# Patient Record
Sex: Male | Born: 1950
Health system: Southern US, Community
[De-identification: ages and names within clinical notes are randomized; demographics above are authoritative.]

## PROBLEM LIST (undated history)

## (undated) DIAGNOSIS — R42 Dizziness and giddiness: Secondary | ICD-10-CM

## (undated) DIAGNOSIS — I10 Essential (primary) hypertension: Secondary | ICD-10-CM

## (undated) DIAGNOSIS — K759 Inflammatory liver disease, unspecified: Secondary | ICD-10-CM

## (undated) DIAGNOSIS — F1911 Other psychoactive substance abuse, in remission: Secondary | ICD-10-CM

## (undated) DIAGNOSIS — I639 Cerebral infarction, unspecified: Secondary | ICD-10-CM

## (undated) DIAGNOSIS — M1990 Unspecified osteoarthritis, unspecified site: Secondary | ICD-10-CM

## (undated) DIAGNOSIS — E785 Hyperlipidemia, unspecified: Secondary | ICD-10-CM

## (undated) DIAGNOSIS — J45909 Unspecified asthma, uncomplicated: Secondary | ICD-10-CM

## (undated) DIAGNOSIS — N400 Enlarged prostate without lower urinary tract symptoms: Secondary | ICD-10-CM

## (undated) HISTORY — DX: Essential (primary) hypertension: I10

## (undated) HISTORY — DX: Cerebral infarction, unspecified: I63.9

## (undated) HISTORY — DX: Hyperlipidemia, unspecified: E78.5

## (undated) HISTORY — PX: NO PAST SURGERIES: SHX2092

## (undated) HISTORY — DX: Benign prostatic hyperplasia without lower urinary tract symptoms: N40.0

## (undated) HISTORY — PX: COLONOSCOPY: SHX174

## (undated) HISTORY — PX: MULTIPLE TOOTH EXTRACTIONS: SHX2053

---

## 2001-11-03 ENCOUNTER — Encounter: Payer: Self-pay | Admitting: Emergency Medicine

## 2001-11-03 ENCOUNTER — Emergency Department (HOSPITAL_COMMUNITY): Admission: EM | Admit: 2001-11-03 | Discharge: 2001-11-03 | Payer: Self-pay | Admitting: Emergency Medicine

## 2005-07-07 ENCOUNTER — Emergency Department (HOSPITAL_COMMUNITY): Admission: EM | Admit: 2005-07-07 | Discharge: 2005-07-07 | Payer: Self-pay | Admitting: Family Medicine

## 2008-04-01 ENCOUNTER — Emergency Department (HOSPITAL_COMMUNITY): Admission: EM | Admit: 2008-04-01 | Discharge: 2008-04-01 | Payer: Self-pay | Admitting: Emergency Medicine

## 2010-02-04 ENCOUNTER — Emergency Department (HOSPITAL_COMMUNITY): Admission: EM | Admit: 2010-02-04 | Discharge: 2010-02-04 | Payer: Self-pay | Admitting: Emergency Medicine

## 2011-06-22 LAB — DIFFERENTIAL
Basophils Absolute: 0
Eosinophils Absolute: 0.1
Lymphocytes Relative: 43
Lymphs Abs: 2.4
Neutrophils Relative %: 45

## 2011-06-22 LAB — URINALYSIS, ROUTINE W REFLEX MICROSCOPIC
Ketones, ur: 15 — AB
Nitrite: NEGATIVE
Urobilinogen, UA: 1
pH: 6

## 2011-06-22 LAB — COMPREHENSIVE METABOLIC PANEL
ALT: 30
CO2: 29
Calcium: 9.5
Creatinine, Ser: 1.2
GFR calc non Af Amer: >60
Glucose, Bld: 100 — ABNORMAL HIGH
Total Bilirubin: 1.3 — ABNORMAL HIGH

## 2011-06-22 LAB — LIPASE, BLOOD: Lipase: 41

## 2011-06-22 LAB — CBC
HCT: 39.6
Hemoglobin: 13.1
MCHC: 33.1
MCV: 88.9
Platelets: 284
RBC: 4.46
RDW: 12.8
WBC: 5.6

## 2012-12-16 ENCOUNTER — Encounter (HOSPITAL_COMMUNITY): Payer: Self-pay | Admitting: Emergency Medicine

## 2012-12-16 ENCOUNTER — Emergency Department (HOSPITAL_COMMUNITY): Payer: Self-pay

## 2012-12-16 ENCOUNTER — Emergency Department (HOSPITAL_COMMUNITY)
Admission: EM | Admit: 2012-12-16 | Discharge: 2012-12-16 | Disposition: A | Discharge status: Home / Self Care | Payer: Self-pay | Attending: Emergency Medicine | Admitting: Emergency Medicine

## 2012-12-16 DIAGNOSIS — J019 Acute sinusitis, unspecified: Secondary | ICD-10-CM

## 2012-12-16 DIAGNOSIS — Z8673 Personal history of transient ischemic attack (TIA), and cerebral infarction without residual deficits: Secondary | ICD-10-CM | POA: Insufficient documentation

## 2012-12-16 DIAGNOSIS — R030 Elevated blood-pressure reading, without diagnosis of hypertension: Secondary | ICD-10-CM

## 2012-12-16 DIAGNOSIS — I10 Essential (primary) hypertension: Secondary | ICD-10-CM | POA: Insufficient documentation

## 2012-12-16 DIAGNOSIS — J329 Chronic sinusitis, unspecified: Secondary | ICD-10-CM | POA: Insufficient documentation

## 2012-12-16 DIAGNOSIS — R51 Headache: Secondary | ICD-10-CM | POA: Insufficient documentation

## 2012-12-16 DIAGNOSIS — R209 Unspecified disturbances of skin sensation: Secondary | ICD-10-CM | POA: Insufficient documentation

## 2012-12-16 DIAGNOSIS — Q674 Other congenital deformities of skull, face and jaw: Secondary | ICD-10-CM | POA: Insufficient documentation

## 2012-12-16 DIAGNOSIS — R5381 Other malaise: Secondary | ICD-10-CM | POA: Insufficient documentation

## 2012-12-16 DIAGNOSIS — R4701 Aphasia: Secondary | ICD-10-CM | POA: Insufficient documentation

## 2012-12-16 DIAGNOSIS — H538 Other visual disturbances: Secondary | ICD-10-CM | POA: Insufficient documentation

## 2012-12-16 DIAGNOSIS — F17200 Nicotine dependence, unspecified, uncomplicated: Secondary | ICD-10-CM | POA: Insufficient documentation

## 2012-12-16 DIAGNOSIS — R42 Dizziness and giddiness: Secondary | ICD-10-CM | POA: Insufficient documentation

## 2012-12-16 HISTORY — DX: Dizziness and giddiness: R42

## 2012-12-16 LAB — CBC WITH DIFFERENTIAL/PLATELET
Basophils Absolute: 0 10*3/uL (ref 0.0–0.1)
Eosinophils Absolute: 0.2 10*3/uL (ref 0.0–0.7)
Hemoglobin: 12.3 g/dL — ABNORMAL LOW (ref 13.0–17.0)
Lymphocytes Relative: 46 % (ref 12–46)
MCH: 28.4 pg (ref 26.0–34.0)
MCHC: 33 g/dL (ref 30.0–36.0)
Monocytes Absolute: 0.4 10*3/uL (ref 0.1–1.0)
Neutrophils Relative %: 43 % (ref 43–77)
Platelets: 208 10*3/uL (ref 150–400)
RDW: 13 % (ref 11.5–15.5)

## 2012-12-16 LAB — COMPREHENSIVE METABOLIC PANEL
AST: 23 U/L (ref 0–37)
Albumin: 3.2 g/dL — ABNORMAL LOW (ref 3.5–5.2)
Alkaline Phosphatase: 62 U/L (ref 39–117)
Chloride: 107 mEq/L (ref 96–112)
Potassium: 4.2 mEq/L (ref 3.5–5.1)
Sodium: 141 mEq/L (ref 135–145)
Total Bilirubin: 0.2 mg/dL — ABNORMAL LOW (ref 0.3–1.2)
Total Protein: 7.3 g/dL (ref 6.0–8.3)

## 2012-12-16 MED ORDER — OXYMETAZOLINE HCL 0.05 % NA SOLN: 2.0000 | Freq: Two times a day (BID) | NASAL | Status: DC

## 2012-12-16 MED ORDER — AMOXICILLIN 500 MG PO CAPS
500.0000 mg | ORAL_CAPSULE | Freq: Once | ORAL | Status: AC
  Administered 2012-12-16: 500 mg via ORAL
  Filled 2012-12-16: qty 1

## 2012-12-16 MED ORDER — ASPIRIN EC 81 MG PO TBEC
81.0000 mg | DELAYED_RELEASE_TABLET | Freq: Every day | ORAL | Status: DC
  Administered 2012-12-16: 81 mg via ORAL
  Filled 2012-12-16 (×2): qty 1

## 2012-12-16 MED ORDER — PREDNISONE 20 MG PO TABS: ORAL_TABLET | ORAL | Status: DC

## 2012-12-16 MED ORDER — AMOXICILLIN 500 MG PO CAPS: 500.0000 mg | ORAL_CAPSULE | Freq: Two times a day (BID) | ORAL | Status: DC

## 2012-12-16 MED ORDER — PREDNISONE 20 MG PO TABS
40.0000 mg | ORAL_TABLET | Freq: Once | ORAL | Status: AC
  Administered 2012-12-16: 40 mg via ORAL
  Filled 2012-12-16: qty 2

## 2012-12-16 MED ORDER — HYDROCHLOROTHIAZIDE 25 MG PO TABS
25.0000 mg | ORAL_TABLET | Freq: Once | ORAL | Status: AC
  Administered 2012-12-16: 25 mg via ORAL
  Filled 2012-12-16: qty 1

## 2012-12-16 MED ORDER — SALINE SPRAY 0.65 % NA SOLN: 1.0000 | NASAL | Status: DC | PRN

## 2012-12-16 MED ORDER — SALINE NASAL SPRAY 0.65 % NA SOLN: 1.0000 | NASAL | Status: DC | PRN

## 2012-12-16 MED ORDER — ATORVASTATIN CALCIUM 40 MG PO TABS: 40.0000 mg | ORAL_TABLET | Freq: Every day | ORAL | Status: DC

## 2012-12-16 MED ORDER — ASPIRIN EC 81 MG PO TBEC: 81.0000 mg | DELAYED_RELEASE_TABLET | Freq: Every day | ORAL | Status: AC

## 2012-12-16 MED ORDER — SODIUM CHLORIDE 0.9 % IV BOLUS (SEPSIS)
1000.0000 mL | Freq: Once | INTRAVENOUS | Status: AC
  Administered 2012-12-16: 1000 mL via INTRAVENOUS

## 2012-12-16 MED ORDER — HYDROMORPHONE HCL PF 1 MG/ML IJ SOLN
1.0000 mg | INTRAMUSCULAR | Status: DC | PRN
  Administered 2012-12-16: 1 mg via INTRAMUSCULAR
  Filled 2012-12-16: qty 1

## 2012-12-16 MED ORDER — GADOBENATE DIMEGLUMINE 529 MG/ML IV SOLN
15.0000 mL | Freq: Once | INTRAVENOUS | Status: AC | PRN
  Administered 2012-12-16: 15 mL via INTRAVENOUS

## 2012-12-16 MED ORDER — HYDROCHLOROTHIAZIDE 25 MG PO TABS: 25.0000 mg | ORAL_TABLET | Freq: Every day | ORAL | Status: DC

## 2012-12-16 NOTE — Progress Notes (Signed)
CM reviewed EPIC notes and chart review information CM spoke with the pt about [facility] MATCH program (3 co pay for each Rx (non narcotic/pill medicine) through [facility] program and choice of pharmacies) Pt agreed to receive assistance from program Pt states he can obtain funds within the 7 day MATCH letter time frame (Letter void after 12/23/12) He reports the staff in pod D has offered a f/u MD appointment in  April 2014  CM spoke with pt who confirms self pay [facility] resident with no pcp. CM discussed and provided written information for self pay pcps, importance of pcp for f/u care, www.needymeds.org, discounted pharmacies, MATCH program and other [place] resources such as financial assistance, DSS and  health department Reviewed Health connect number to assist with finding self pay provider close to pt's residence. Reviewed resources for [place] self pay pcps like [facility], family medicine at [address], [facility] family practice, general medical clinics, [facility] urgent care plus others, [facility] out [facility], housing, affordable care act/health reform (deadline 12/23/12) and other resources in [employer] Pt voiced understanding and appreciation of resources provided  Pt reports he has already seen someone for affordable care marketplace and found not to "qualify"  Pt is eligible for [facility] MATCH program. PDMI information entered. MATCH letter completed and faxed to [phone] with confirmation fax received at [phone]. CM updated EDP and ED RN CM signing off

## 2012-12-16 NOTE — ED Provider Notes (Addendum)
History     CSN: [id number]  Arrival date & time 12/16/12  4098   First MD Initiated Contact with Patient 12/16/12 [phone]      Chief Complaint  Patient presents with  . Dizziness  . Blurred Vision    (Consider location/radiation/quality/duration/timing/severity/associated sxs/prior treatment) HPI The patient presents with multiple complaints.  He notes over the past days he has developed gait instability, left-sided facial numbness and intermittent pain.  He states the symptoms began without clear precipitant.  Since onset symptoms have been persistent, with no clear alleviating or exacerbating factors.  He denies concurrent chest pain, dyspnea, cough, fever, chills. He states that he has history of vertigo, but no history of stroke, MI, blood clot.  Past Medical History  Diagnosis Date  . Vertigo     History reviewed. No pertinent past surgical history.  History reviewed. No pertinent family history.  History  Substance Use Topics  . Smoking status: Current Every Day Smoker  . Smokeless tobacco: Not on file  . Alcohol Use: No      Review of Systems  Constitutional:       Per HPI, otherwise negative  HENT:       Per HPI, otherwise negative  Respiratory:       Per HPI, otherwise negative  Cardiovascular:       Per HPI, otherwise negative  Gastrointestinal: Negative for vomiting.  Endocrine:       Negative aside from HPI  Genitourinary:       Neg aside from HPI   Musculoskeletal:       Per HPI, otherwise negative  Skin: Negative.   Neurological: Positive for dizziness, facial asymmetry, speech difficulty, weakness, light-headedness, numbness and headaches. Negative for syncope.    Allergies  Review of patient's allergies indicates no known allergies.  Home Medications   Current Outpatient Rx  Name  Route  Sig  Dispense  Refill  . OVER THE COUNTER MEDICATION   Oral   Take 3 tablets by mouth every 4 (four) hours as needed (for motion sickness. [employer] store  motion sickness tablets).         Marland Kitchen OVER THE COUNTER MEDICATION   Oral   Take 1 tablet by mouth daily as needed (for congestion. [employer] congestion relief).           BP 170/116  Pulse 57  Temp(Src) 97.8 F (36.6 C) (Oral)  Resp 16  SpO2 99%  Physical Exam  Nursing note and vitals reviewed. Constitutional: He is oriented to person, place, and time. He appears well-developed. No distress.  HENT:  Head: Normocephalic and atraumatic.  Eyes: Conjunctivae and EOM are normal.  Cardiovascular: Normal rate and regular rhythm.   Pulmonary/Chest: Effort normal. No stridor. No respiratory distress.  Abdominal: He exhibits no distension.  Musculoskeletal: He exhibits no edema.  Neurological: He is alert and oriented to person, place, and time.  Patient has a wide-based gait with gross instability.  The patient has slowed finger/nose bilaterally. Patient's face is symmetric, with no word finding difficulty or dysphonia.  Pupils are equal round reactive, tracks appropriately.  Skin: Skin is warm and dry.  Psychiatric: He has a normal mood and affect.    ED Course  Procedures (including critical care time)  Labs Reviewed  CBC WITH DIFFERENTIAL - Abnormal; Notable for the following:    Hemoglobin 12.3 (*)    HCT 37.3 (*)    All other components within normal limits  COMPREHENSIVE METABOLIC PANEL - Abnormal; Notable  for the following:    Albumin 3.2 (*)    Total Bilirubin 0.2 (*)    All other components within normal limits  TROPONIN I  LDL CHOLESTEROL, DIRECT   Dg Chest 2 View  12/16/2012  *RADIOLOGY REPORT*  Clinical Data: Dizziness and weakness.  CHEST - 2 VIEW  Comparison: None.  Findings: Two views of the chest were obtained.  Lungs are clear without airspace disease or edema.  Heart size is within normal limits.  Images of the upper abdomen are unremarkable.  Bony thorax is intact. The thoracic aorta is prominent and may be tortuous. Prominent soft tissue along the right  side of the mediastinum is probably vascular in etiology.  IMPRESSION: No acute cardiopulmonary disease.   Original Report Authenticated By: Richarda Overlie, M.D.    Ct Head Wo Contrast  12/16/2012  *RADIOLOGY REPORT*  Clinical Data:  Vertigo.  Recent fall.  Left cheek pain and tenderness.  CT HEAD WITHOUT CONTRAST CT MAXILLOFACIAL WITHOUT CONTRAST  Technique:  Multidetector CT imaging of the head and maxillofacial structures were performed using the standard protocol without intravenous contrast. Multiplanar CT image reconstructions of the maxillofacial structures were also generated.  Comparison:  Head CT from 02/04/2010  CT HEAD  Findings: No evidence for acute hemorrhage, hydrocephalus, mass lesion, abnormal extra-axial fluid collection.  No CT evidence for acute ischemia.  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  No fluid in the mastoid air cells or middle ears.  There is an air- fluid level on the right maxillary sinus, suggesting acute sinusitis.  The frontal and sphenoid sinuses are clear.  No evidence for skull fracture.  IMPRESSION: No acute intracranial abnormality.  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  Air-fluid level of the right maxillary sinus raises a question of acute sinusitis.  CT MAXILLOFACIAL  Findings:   The mandible is intact.  The temporomandibular joints are located.  No zygomatic arch fracture.  No medial or inferior orbital wall blowout fracture is evident.  Mucosal thickening with air-fluid levels noted in both maxillary sinuses.  IMPRESSION: No evidence for facial bone fracture.  Acute on chronic bilateral maxillary sinusitis.   Original Report Authenticated By: Kennith Center, M.D.    Ct Maxillofacial Wo Cm  12/16/2012  *RADIOLOGY REPORT*  Clinical Data:  Vertigo.  Recent fall.  Left cheek pain and tenderness.  CT HEAD WITHOUT CONTRAST CT MAXILLOFACIAL WITHOUT CONTRAST  Technique:  Multidetector CT imaging of the head and maxillofacial structures were performed using  the standard protocol without intravenous contrast. Multiplanar CT image reconstructions of the maxillofacial structures were also generated.  Comparison:  Head CT from 02/04/2010  CT HEAD  Findings: No evidence for acute hemorrhage, hydrocephalus, mass lesion, abnormal extra-axial fluid collection.  No CT evidence for acute ischemia.  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  No fluid in the mastoid air cells or middle ears.  There is an air- fluid level on the right maxillary sinus, suggesting acute sinusitis.  The frontal and sphenoid sinuses are clear.  No evidence for skull fracture.  IMPRESSION: No acute intracranial abnormality.  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  Air-fluid level of the right maxillary sinus raises a question of acute sinusitis.  CT MAXILLOFACIAL  Findings:   The mandible is intact.  The temporomandibular joints are located.  No zygomatic arch fracture.  No medial or inferior orbital wall blowout fracture is evident.  Mucosal thickening with air-fluid levels noted in both maxillary sinuses.  IMPRESSION: No evidence for facial  bone fracture.  Acute on chronic bilateral maxillary sinusitis.   Original Report Authenticated By: Kennith Center, M.D.      No diagnosis found.  Cardiac: 60sr, normal  O2- 99%ra, normal   Date: 12/16/2012  Rate: 57  Rhythm: normal sinus rhythm  QRS Axis: normal  Intervals: normal  ST/T Wave abnormalities: normal  Conduction Disutrbances: none  Narrative Interpretation: unremarkable   Update: patient in no distress.     I discussed the case with our neurology team.   MDM  The patient presents with ataxia, left-sided facial dysesthesia.  On exam the patient has cerebellar deficits, but is in no distress.  Given the patient's persistent symptoms, he was admitted for further evaluation and management after consultation with our neurology team.  The patient was admitted to a general medicine team, do to the persistent symptoms,  which may be due to symptomatic vertigo / hypertensive crisis if there is no stroke.        Gerhard Munch, MD 12/16/12 1554  Gerhard Munch, MD 12/16/12 [phone]

## 2012-12-16 NOTE — ED Notes (Signed)
Pt ambulatory to room without problems.  Pt complains of left side of face swollen x 3 days.  Pt denies any pain.

## 2012-12-16 NOTE — ED Notes (Signed)
Pt c/o dizziness with blurry vision x 3 days; pt sts hx of vertigo; pt sts intermittent HA

## 2012-12-16 NOTE — Progress Notes (Signed)
  MATCH Medication Assistance Card Name: Todd Mendoza ID (MRN): [id number] Bin: [id number] RX Group: [id number] PCN: [id number]  Discharge Date: 12/16/2012 Expiration Date: 12/23/2012  Dear Todd Mendoza  You have been approved to have your discharge prescriptions filled through our [facility] (Medication Assistance Through [facility]) program. This program allows for a one-time (no refills) 34-day supply of selected medications for a low copay amount.  The copay is $3.00 per prescription. For instance, if you have one prescription, you will pay $3.00; for two prescriptions, you pay $6.00; for three prescriptions, you pay $9.00; and so on.  Only certain pharmacies are participating in this program with [facility]. You will need to select one of the pharmacies from the attached list and take your prescriptions, this letter, and your photo ID to one of the participating pharmacies.   We are excited that you are able to use the [facility] program to get your medications. These prescriptions must be filled within 7 days of hospital discharge or they will no longer be valid for the [facility] program. Should you have any problems with your prescriptions please contact your case management team member at [phone].  Thank you,   [employer] [facility]   Participating [facility]  [facility]   [facility] [id number] [place], [place]   [facility] [address] [place], [place]   [facility] [address] [place], [place]   [facility]   [address], [place], [place]   [address], [place], [place]   [address], [place], [place]   [address] [place], [place]   [address], [place], [place]   [address] [place], [place]   [address] [place], [place]   [address] [place], [place]   [address], [place], [place]   [address] [place], [place], [place]  [facility]   [address] [place], [place]   [address] [place], [place]   [address] [place], [place], [place]   [address], [place], [place]   [address], [place], [place]   [address] [place] [address] [place], [place], [place] [facility]   [address] [place], [place]   [address] [employer] [address], [place], [place]   [address] [place], [place]   [address], [place], [place]   [address], [place], [place]   [address], [place], [place]   [address] [place] [address], [place], [place]   [address], [place], [place]   [address], [place], [place]   [address], [place], [place]   [address], [place], [place]   [address] [place], [place]   [address] [place], [place]   [address] [place], [place], [place]  Independent Pharmacies   [facility] [address] [place] [place], [place]   [facility] [address] [place], [place]   [facility] [address] [place], [place]   For continued medication needs, please contact the [facility] Department at  [phone].

## 2012-12-16 NOTE — Progress Notes (Signed)
[facility] ED Todd Mendoza consulted by Dr Clifton Custard for medication assistance for pt Todd Mendoza reviewed EPIC clinicals and chart information Pt is eligible for [facility] program assistance Todd Mendoza Reviewed MATCH program with Dr Clifton Custard Pt informed Dr Clifton Custard that he did not have money Todd Mendoza discussed some financial assistance programs for [employer]

## 2012-12-16 NOTE — ED Notes (Signed)
Pt spoke with care manager via telephone.  Papers were faxed to pt on information on where he could get his Rx's filled and cost of same.

## 2012-12-16 NOTE — Consult Note (Signed)
INTERNAL MEDICINE TEACHING SERVICE -- CONSULT NOTE  Date: 12/16/2012               Patient Name:  Todd Mendoza MRN: [id number]  DOB: 05/29/1951 Age / Sex: 62 y.o., male   PCP: Provider Default, MD              Medical Service: Internal Medicine Teaching Service              Attending Physician: Dr. Criselda Peaches    First Contact: Dr. Elenor Legato Pager: [phone]  Second Contact: Dr. Stacy Gardner Pager: [phone]            After Hours (After 5p/  First Contact Pager: [phone]  weekends / holidays): Second Contact Pager: [phone]     Chief Complaint: dizziness, blurry vision, L-sided facial pain  History of Present Illness: Patient is a 62 y.o. male with a PMHx of vertigo, who presents to [facility] for evaluation of dizziness and blurry vision. The patient states that these symptoms began approximately 3 days prior to admission. The first symptom he noticed was dizziness with ambulation, and subsequently began to develop blurry vision and left-sided facial pain within the next 24 hours. He states that he began taking an OTC sinus medication which has improved his symptoms significantly. He denies any recent fever or chills. Denies any recent illness. Denies facial pressure. Denies anosmia. He states that he had similar symptoms approximately one year ago, which resolved after taking OTC sinus medication, and states that he typically has similar symptoms annually for the past few years. Of note, the patient states that his dizziness and blurred vision are nearly resolved at time of presentation, and he is having no further difficulty with ambulation. He denies any weakness or numbness. He admits to a previous diagnosis of vertigo, for which he takes meclizine, but states he only has vertigo symptoms once per year, as described above.    Review of Systems: Per HPI.   Current Outpatient Medications: No current facility-administered medications on file prior to encounter.   No current outpatient  prescriptions on file prior to encounter.    Allergies: No Known Allergies   Past Medical History: Past Medical History  Diagnosis Date  . Vertigo     Past Surgical History: History reviewed. No pertinent past surgical history.  Family History: History reviewed. No pertinent family history.  Social History: History   Social History  . Marital Status: Single    Spouse Name: N/A    Number of Children: N/A  . Years of Education: N/A   Occupational History  . Not on file.   Social History Main Topics  . Smoking status: Current Every Day Smoker  . Smokeless tobacco: Not on file  . Alcohol Use: No  . Drug Use: No  . Sexually Active: Not on file   Other Topics Concern  . Not on file   Social History Narrative  . No narrative on file     Vital Signs: Blood pressure 170/116, pulse 57, temperature 97.8 F (36.6 C), temperature source Oral, resp. rate 16, SpO2 99.00%.  Physical Exam: General: Vital signs reviewed and noted. Well-developed, well-nourished, in no acute distress; alert, appropriate and cooperative throughout examination.  Head: Normocephalic, atraumatic. TTP over L maxillary sinus.  Eyes: PERRL, EOMI, No signs of anemia or jaundince.  Nose: Mucous membranes moist. Erythematous and boggy nasal turbinates.   Throat: Oropharynx nonerythematous. Cobblestoning appreciated.   Neck: No deformities, masses, or tenderness noted.  Lungs:  Normal  respiratory effort. Clear to auscultation BL without crackles or wheezes.  Heart: RRR. S1 and S2 normal without gallop, murmur, or rubs.  Abdomen:  BS normoactive. Soft, Nondistended, non-tender.  No masses or organomegaly.  Extremities: No pretibial edema.  Neurologic: A&O X3, CN II - XII are grossly intact. Motor strength is 5/5 in the all 4 extremities, Sensations intact to light touch. Cerebellar signs negative. Gait normal.  Skin: No visible rashes, scars.   Lab results: Comprehensive Metabolic Panel:     Component Value Date/Time   NA 141 12/16/2012 0906   K 4.2 12/16/2012 0906   CL 107 12/16/2012 0906   CO2 27 12/16/2012 0906   BUN 6 12/16/2012 0906   CREATININE 0.86 12/16/2012 0906   GLUCOSE 78 12/16/2012 0906   CALCIUM 9.1 12/16/2012 0906   AST 23 12/16/2012 0906   ALT 18 12/16/2012 0906   ALKPHOS 62 12/16/2012 0906   BILITOT 0.2* 12/16/2012 0906   PROT 7.3 12/16/2012 0906   ALBUMIN 3.2* 12/16/2012 0906    CBC:    Component Value Date/Time   WBC 5.0 12/16/2012 0906   HGB 12.3* 12/16/2012 0906   HCT 37.3* 12/16/2012 0906   PLT 208 12/16/2012 0906   MCV 86.1 12/16/2012 0906   NEUTROABS 2.2 12/16/2012 0906   LYMPHSABS 2.2 12/16/2012 0906   MONOABS 0.4 12/16/2012 0906   EOSABS 0.2 12/16/2012 0906   BASOSABS 0.0 12/16/2012 0906    Imaging results:  Dg Chest 2 View  12/16/2012  *RADIOLOGY REPORT*  Clinical Data: Dizziness and weakness.  CHEST - 2 VIEW  Comparison: None.  Findings: Two views of the chest were obtained.  Lungs are clear without airspace disease or edema.  Heart size is within normal limits.  Images of the upper abdomen are unremarkable.  Bony thorax is intact. The thoracic aorta is prominent and may be tortuous. Prominent soft tissue along the right side of the mediastinum is probably vascular in etiology.  IMPRESSION: No acute cardiopulmonary disease.   Original Report Authenticated By: Richarda Overlie, M.D.    Ct Head Wo Contrast  12/16/2012  *RADIOLOGY REPORT*  Clinical Data:  Vertigo.  Recent fall.  Left cheek pain and tenderness.  CT HEAD WITHOUT CONTRAST CT MAXILLOFACIAL WITHOUT CONTRAST  Technique:  Multidetector CT imaging of the head and maxillofacial structures were performed using the standard protocol without intravenous contrast. Multiplanar CT image reconstructions of the maxillofacial structures were also generated.  Comparison:  Head CT from 02/04/2010  CT HEAD  Findings: No evidence for acute hemorrhage, hydrocephalus, mass lesion, abnormal extra-axial fluid collection.  No CT  evidence for acute ischemia.  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  No fluid in the mastoid air cells or middle ears.  There is an air- fluid level on the right maxillary sinus, suggesting acute sinusitis.  The frontal and sphenoid sinuses are clear.  No evidence for skull fracture.  IMPRESSION: No acute intracranial abnormality.  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  Air-fluid level of the right maxillary sinus raises a question of acute sinusitis.  CT MAXILLOFACIAL  Findings:   The mandible is intact.  The temporomandibular joints are located.  No zygomatic arch fracture.  No medial or inferior orbital wall blowout fracture is evident.  Mucosal thickening with air-fluid levels noted in both maxillary sinuses.  IMPRESSION: No evidence for facial bone fracture.  Acute on chronic bilateral maxillary sinusitis.   Original Report Authenticated By: Kennith Center, M.D.    Mr [facility] Wo Contrast  12/16/2012  *RADIOLOGY REPORT*  Clinical Data:  62 year old male with vertigo.  Headache. Hypertension. Recent fall.  Comparison:  head and face CT 12/16/2012.  Head CT 02/04/2010.  MRI HEAD WITHOUT CONTRAST  Technique: Multiplanar, multiecho pulse sequences of the brain and surrounding structures were obtained according to standard protocol without intravenous contrast.  Findings:  Cerebral volume has not significantly changed since 2011. No restricted diffusion to suggest acute infarction.  No midline shift, mass effect, evidence of mass lesion, ventriculomegaly, extra-axial collection or acute intracranial hemorrhage.  Cervicomedullary junction and pituitary are within normal limits.  Negative visualized cervical spine.  Evidence of occlusion of the nondominant distal left vertebral artery (series 6 image 3).  See MRA findings below.  Other Major intracranial vascular flow voids are preserved.  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  Chronic left lateral medullary  (PICA) infarct.  Brain stem elsewhere within normal limits.  Confluent cerebral white matter T2 and FLAIR hyperintensity in a nonspecific pattern.  No supratentorial cortical encephalomalacia.  Mild T2 heterogeneity of the deep gray matter nuclei.  Visualized orbit soft tissues are within normal limits.  Maxillary and ethmoid sinus fluid levels.  Sphenoid and frontal sinuses appear spared.  Mastoids are clear.  Grossly normal visualized internal auditory structures.  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  Elsewhere Visualized bone marrow signal is within normal limits.  IMPRESSION: 1. No acute intracranial abnormality. 2.  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  Evidence of distal left vertebral artery poor flow or occlusion (likely chronic).  See MRA findings below. 3.  Advanced nonspecific cerebral white matter signal changes. Favor chronic small vessel disease. 4.  Bilateral paranasal sinus fluid levels suggestive of acute sinusitis.  MRA NECK WITHOUT AND WITH CONTRAST  Contrast: 15mL MULTIHANCE GADOBENATE DIMEGLUMINE 529 MG/ML IV SOLN   Technique:  Angiographic images of the neck were obtained using MRA technique without and with intravenous contrast.  Carotid stenosis measurements (when applicable) are obtained utilizing NASCET criteria, using the distal internal carotid diameter as the denominator.  Findings:  Precontrast time-of-flight images reveal antegrade flow signal the cervical carotid arteries.  There is antegrade flow signal in the right vertebral artery.  There is little to no antegrade flow signal in the proximal left vertebral artery.  No time-of-flight flow signal in the left vertebral above the C4 level.  Postcontrast images demonstrate a three-vessel arch configuration. No great vessel origin stenosis.  There is tortuosity of the proximal great vessels.  Right common carotid artery origin within normal limits.  Widely patent right carotid  bifurcation.  Tortuous proximal cervical right ICA with a kinked appearance, but otherwise no cervical right ICA stenosis.  Tortuous but otherwise negative left CCA.  Widely patent left carotid bifurcation.  Negative cervical left ICA.  Tortuous brachial cephalic artery with a mildly kinked appearance. No proximal right subclavian artery stenosis.  Normal right vertebral artery origin.  Dominant appearing right vertebral artery is patent throughout the neck and at the skull base.  Mildly irregular proximal left subclavian artery without stenosis. Faint irregular enhancement of the left vertebral artery throughout its course with evidence of numerous severe tandem stenoses.  Low enhancement in the left V3 segment.  There is better enhancement in the left V4 segment with evidence of tandem to the severe intracranial stenoses.  See intracranial findings below.  IMPRESSION: 1. MRA appearance suggesting trickle flow in the left vertebral artery throughout its course.  Tandem stenoses in the  intracranial segment of the left vertebral arteries suspected:  See intracranial findings below. 2.  Otherwise no hemodynamically significant carotid or vertebral artery stenosis in the neck.  Tortuous great vessels.  MRA HEAD WITHOUT CONTRAST  Technique: Angiographic images of the Circle of Willis were obtained using MRA technique without  intravenous contrast.  Findings:  Antegrade flow in the dominant distal right vertebral artery.  There is flow signal evident in the left PICA.  No antegrade flow signal in the distal left vertebral artery.  There is flow in a portion of the right PICA, but not at the right PICA origin.  Basilar artery is patent.  AICA origins are patent.  Basilar artery irregularity without significant stenosis.  SCA and PCA origins are within normal limits.  Posterior communicating arteries are diminutive or absent.  Bilateral PCA branches are within normal limits.  Antegrade flow in both ICA siphons.  Moderate  cavernous segment irregularity bilaterally.  Unusual lateral left ophthalmic artery origin.  Conventional right ophthalmic artery origin.  Probable atherosclerotic pseudo-lesion directed medially at the level of the left ophthalmic artery origin.  Overall mild bilateral supraclinoid ICA stenosis, greater on the right.  Carotid termini are patent.  MCA and ACA origins are normal. Anterior communicating artery and visualized ACA branches are within normal limits.  Visualized bilateral MCA branches are within normal limits.  IMPRESSION: 1.  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 2.  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  There is some flow signal detected in both PICA vessels. 3.  Moderate irregularity of the bilateral cavernous and supraclinoid ICA, with mild stenosis right greater than left. 4. 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   Original Report Authenticated By: Erskine Speed, M.D.    Mr Angiogram Neck W Wo Contrast  12/16/2012  *RADIOLOGY REPORT*  Clinical Data:  62 year old male with vertigo.  Headache. Hypertension. Recent fall.  Comparison:  head and face CT 12/16/2012.  Head CT 02/04/2010.  MRI HEAD WITHOUT CONTRAST  Technique: Multiplanar, multiecho pulse sequences of the brain and surrounding structures were obtained according to standard protocol without intravenous contrast.  Findings:  Cerebral volume has not significantly changed since 2011. No restricted diffusion to suggest acute infarction.  No midline shift, mass effect, evidence of mass lesion, ventriculomegaly, extra-axial collection or acute intracranial hemorrhage.  Cervicomedullary junction and pituitary are within normal limits.  Negative visualized cervical spine.  Evidence of occlusion of the nondominant distal left vertebral artery (series 6 image 3).  See MRA findings below.  Other Major intracranial  vascular flow voids are preserved.  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  Chronic left lateral medullary (PICA) infarct.  Brain stem elsewhere within normal limits.  Confluent cerebral white matter T2 and FLAIR hyperintensity in a nonspecific pattern.  No supratentorial cortical encephalomalacia.  Mild T2 heterogeneity of the deep gray matter nuclei.  Visualized orbit soft tissues are within normal limits.  Maxillary and ethmoid sinus fluid levels.  Sphenoid and frontal sinuses appear spared.  Mastoids are clear.  Grossly normal visualized internal auditory structures.  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  Elsewhere Visualized bone marrow signal is within normal limits.  IMPRESSION: 1. No acute intracranial abnormality. 2.  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  Evidence of distal left vertebral artery poor flow or occlusion (likely chronic).  See MRA findings below. 3.  Advanced nonspecific  cerebral white matter signal changes. Favor chronic small vessel disease. 4.  Bilateral paranasal sinus fluid levels suggestive of acute sinusitis.  MRA NECK WITHOUT AND WITH CONTRAST  Contrast: 15mL MULTIHANCE GADOBENATE DIMEGLUMINE 529 MG/ML IV SOLN   Technique:  Angiographic images of the neck were obtained using MRA technique without and with intravenous contrast.  Carotid stenosis measurements (when applicable) are obtained utilizing NASCET criteria, using the distal internal carotid diameter as the denominator.  Findings:  Precontrast time-of-flight images reveal antegrade flow signal the cervical carotid arteries.  There is antegrade flow signal in the right vertebral artery.  There is little to no antegrade flow signal in the proximal left vertebral artery.  No time-of-flight flow signal in the left vertebral above the C4 level.  Postcontrast images demonstrate a three-vessel arch configuration. No great vessel origin stenosis.  There is  tortuosity of the proximal great vessels.  Right common carotid artery origin within normal limits.  Widely patent right carotid bifurcation.  Tortuous proximal cervical right ICA with a kinked appearance, but otherwise no cervical right ICA stenosis.  Tortuous but otherwise negative left CCA.  Widely patent left carotid bifurcation.  Negative cervical left ICA.  Tortuous brachial cephalic artery with a mildly kinked appearance. No proximal right subclavian artery stenosis.  Normal right vertebral artery origin.  Dominant appearing right vertebral artery is patent throughout the neck and at the skull base.  Mildly irregular proximal left subclavian artery without stenosis. Faint irregular enhancement of the left vertebral artery throughout its course with evidence of numerous severe tandem stenoses.  Low enhancement in the left V3 segment.  There is better enhancement in the left V4 segment with evidence of tandem to the severe intracranial stenoses.  See intracranial findings below.  IMPRESSION: 1. MRA appearance suggesting trickle flow in the left vertebral artery throughout its course.  Tandem stenoses in the intracranial segment of the left vertebral arteries suspected:  See intracranial findings below. 2.  Otherwise no hemodynamically significant carotid or vertebral artery stenosis in the neck.  Tortuous great vessels.  MRA HEAD WITHOUT CONTRAST  Technique: Angiographic images of the Circle of Willis were obtained using MRA technique without  intravenous contrast.  Findings:  Antegrade flow in the dominant distal right vertebral artery.  There is flow signal evident in the left PICA.  No antegrade flow signal in the distal left vertebral artery.  There is flow in a portion of the right PICA, but not at the right PICA origin.  Basilar artery is patent.  AICA origins are patent.  Basilar artery irregularity without significant stenosis.  SCA and PCA origins are within normal limits.  Posterior communicating  arteries are diminutive or absent.  Bilateral PCA branches are within normal limits.  Antegrade flow in both ICA siphons.  Moderate cavernous segment irregularity bilaterally.  Unusual lateral left ophthalmic artery origin.  Conventional right ophthalmic artery origin.  Probable atherosclerotic pseudo-lesion directed medially at the level of the left ophthalmic artery origin.  Overall mild bilateral supraclinoid ICA stenosis, greater on the right.  Carotid termini are patent.  MCA and ACA origins are normal. Anterior communicating artery and visualized ACA branches are within normal limits.  Visualized bilateral MCA branches are within normal limits.  IMPRESSION: 1.  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 2.  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  There is some flow signal detected in both PICA vessels. 3.  Moderate irregularity of the bilateral cavernous and supraclinoid ICA, with mild  stenosis right greater than left. 4. 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   Original Report Authenticated By: Erskine Speed, M.D.    Mr Brain Wo Contrast  12/16/2012  *RADIOLOGY REPORT*  Clinical Data:  62 year old male with vertigo.  Headache. Hypertension. Recent fall.  Comparison:  head and face CT 12/16/2012.  Head CT 02/04/2010.  MRI HEAD WITHOUT CONTRAST  Technique: Multiplanar, multiecho pulse sequences of the brain and surrounding structures were obtained according to standard protocol without intravenous contrast.  Findings:  Cerebral volume has not significantly changed since 2011. No restricted diffusion to suggest acute infarction.  No midline shift, mass effect, evidence of mass lesion, ventriculomegaly, extra-axial collection or acute intracranial hemorrhage.  Cervicomedullary junction and pituitary are within normal limits.  Negative visualized cervical spine.  Evidence of occlusion of  the nondominant distal left vertebral artery (series 6 image 3).  See MRA findings below.  Other Major intracranial vascular flow voids are preserved.  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  Chronic left lateral medullary (PICA) infarct.  Brain stem elsewhere within normal limits.  Confluent cerebral white matter T2 and FLAIR hyperintensity in a nonspecific pattern.  No supratentorial cortical encephalomalacia.  Mild T2 heterogeneity of the deep gray matter nuclei.  Visualized orbit soft tissues are within normal limits.  Maxillary and ethmoid sinus fluid levels.  Sphenoid and frontal sinuses appear spared.  Mastoids are clear.  Grossly normal visualized internal auditory structures.  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  Elsewhere Visualized bone marrow signal is within normal limits.  IMPRESSION: 1. No acute intracranial abnormality. 2.  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  Evidence of distal left vertebral artery poor flow or occlusion (likely chronic).  See MRA findings below. 3.  Advanced nonspecific cerebral white matter signal changes. Favor chronic small vessel disease. 4.  Bilateral paranasal sinus fluid levels suggestive of acute sinusitis.  MRA NECK WITHOUT AND WITH CONTRAST  Contrast: 15mL MULTIHANCE GADOBENATE DIMEGLUMINE 529 MG/ML IV SOLN   Technique:  Angiographic images of the neck were obtained using MRA technique without and with intravenous contrast.  Carotid stenosis measurements (when applicable) are obtained utilizing NASCET criteria, using the distal internal carotid diameter as the denominator.  Findings:  Precontrast time-of-flight images reveal antegrade flow signal the cervical carotid arteries.  There is antegrade flow signal in the right vertebral artery.  There is little to no antegrade flow signal in the proximal left vertebral artery.  No time-of-flight flow signal in the left vertebral above the C4  level.  Postcontrast images demonstrate a three-vessel arch configuration. No great vessel origin stenosis.  There is tortuosity of the proximal great vessels.  Right common carotid artery origin within normal limits.  Widely patent right carotid bifurcation.  Tortuous proximal cervical right ICA with a kinked appearance, but otherwise no cervical right ICA stenosis.  Tortuous but otherwise negative left CCA.  Widely patent left carotid bifurcation.  Negative cervical left ICA.  Tortuous brachial cephalic artery with a mildly kinked appearance. No proximal right subclavian artery stenosis.  Normal right vertebral artery origin.  Dominant appearing right vertebral artery is patent throughout the neck and at the skull base.  Mildly irregular proximal left subclavian artery without stenosis. Faint irregular enhancement of the left vertebral artery throughout its course with evidence of numerous severe tandem stenoses.  Low enhancement in the left V3 segment.  There is better enhancement  in the left V4 segment with evidence of tandem to the severe intracranial stenoses.  See intracranial findings below.  IMPRESSION: 1. MRA appearance suggesting trickle flow in the left vertebral artery throughout its course.  Tandem stenoses in the intracranial segment of the left vertebral arteries suspected:  See intracranial findings below. 2.  Otherwise no hemodynamically significant carotid or vertebral artery stenosis in the neck.  Tortuous great vessels.  MRA HEAD WITHOUT CONTRAST  Technique: Angiographic images of the Circle of Willis were obtained using MRA technique without  intravenous contrast.  Findings:  Antegrade flow in the dominant distal right vertebral artery.  There is flow signal evident in the left PICA.  No antegrade flow signal in the distal left vertebral artery.  There is flow in a portion of the right PICA, but not at the right PICA origin.  Basilar artery is patent.  AICA origins are patent.  Basilar artery  irregularity without significant stenosis.  SCA and PCA origins are within normal limits.  Posterior communicating arteries are diminutive or absent.  Bilateral PCA branches are within normal limits.  Antegrade flow in both ICA siphons.  Moderate cavernous segment irregularity bilaterally.  Unusual lateral left ophthalmic artery origin.  Conventional right ophthalmic artery origin.  Probable atherosclerotic pseudo-lesion directed medially at the level of the left ophthalmic artery origin.  Overall mild bilateral supraclinoid ICA stenosis, greater on the right.  Carotid termini are patent.  MCA and ACA origins are normal. Anterior communicating artery and visualized ACA branches are within normal limits.  Visualized bilateral MCA branches are within normal limits.  IMPRESSION: 1.  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 2.  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  There is some flow signal detected in both PICA vessels. 3.  Moderate irregularity of the bilateral cavernous and supraclinoid ICA, with mild stenosis right greater than left. 4. 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   Original Report Authenticated By: Erskine Speed, M.D.    Ct Maxillofacial Wo Cm  12/16/2012  *RADIOLOGY REPORT*  Clinical Data:  Vertigo.  Recent fall.  Left cheek pain and tenderness.  CT HEAD WITHOUT CONTRAST CT MAXILLOFACIAL WITHOUT CONTRAST  Technique:  Multidetector CT imaging of the head and maxillofacial structures were performed using the standard protocol without intravenous contrast. Multiplanar CT image reconstructions of the maxillofacial structures were also generated.  Comparison:  Head CT from 02/04/2010  CT HEAD  Findings: No evidence for acute hemorrhage, hydrocephalus, mass lesion, abnormal extra-axial fluid collection.  No CT evidence for acute ischemia.  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  No fluid in the mastoid air cells or middle ears.  There is an air- fluid level on the right maxillary sinus, suggesting acute sinusitis.  The frontal and sphenoid sinuses are clear.  No evidence for skull fracture.  IMPRESSION: No acute intracranial abnormality.  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  Air-fluid level of the right maxillary sinus raises a question of acute sinusitis.  CT MAXILLOFACIAL  Findings:   The mandible is intact.  The temporomandibular joints are located.  No zygomatic arch fracture.  No medial or inferior orbital wall blowout fracture is evident.  Mucosal thickening with air-fluid levels noted in both maxillary sinuses.  IMPRESSION: No evidence for facial bone fracture.  Acute on chronic bilateral maxillary sinusitis.   Original Report Authenticated By: Kennith Center, M.D.     Assessment & Plan:  Pt is a 62 y.o. yo male with a PMHx of vertigo who was evaluated on 12/16/2012 with symptoms of dizziness, which was determined to be secondary to acute sinusitis.   Acute sinusitis - maxillofacial CT revealed bilateral maxillary sinusitis, interpreted as acute on chronic per radiology. The patient denies any history of the diagnosis of sinusitis, however his complaints of left-sided facial pain and tenderness to palpation, in addition to dizziness, postnasal drip, and mildly blurry vision suggest that his complaints of similar symptoms annually are likely secondary to acute sinusitis which is likely precipitated by seasonal allergies(which would be consistent with the patient's erythematous and edematous nasal turbinates). Given he has objective findings on CT, and his symptoms are significant, he will be treated for acute bacterial rhinosinusitis at this time. Overall, he is clinically stable and is feeling well, and is not appropriate for inpatient admission at this time, however he will need followup, and can be seen in the [facility] to establish care as he does not have a  PCP.  - amoxicillin 500mg  q12h x 5 days - prednisone 40mg  x 3 days - afrin PRN (to be used for <3 days)  Remote cerebellar infarct - CT and MRI both revealed an area of ischemia in the right inferior cerebellum, and MRI further revealed that the patient had bilateral cerebellar infarcts, R>L. Unclear as to what extent this issue is contributing to the patient's vertigo, as he states he does not have any vertigo symptoms at baseline, but only in association with illness similar to that he is experiencing at his current presentation. As it is possible that the patient's symptoms of dizziness and blurred vision could be secondary to TIA, he was counseled to return to the ED if he had any new or worsening neurologic symptoms (although acute sinusitis is the more likely explanation for his symptoms).  - atorvastatin 40mg  -> can titrate downwards pending FLP results - ASA 81mg    Elevated BP -  BP = 170/116 in ED. It is unclear if he has chronic hypertension or whether his elevated BP is 2/2 phenylephrine use alone. - instructed to d/c oral phenylephrine-containing decongestant - HCTZ 25mg  QD (can reassess BP at time of clinic f/u and consider d/c if BP wnl after completion of prednisone course)  Dispo - Discussed the case with Dr. Amada Jupiter of neurology who agrees that the patient does not need admission as he is stable and MRI was unrevealing of acute stroke. The patient will be discharged from the ED but will be seen in the [facility] on 12/26/2012 to f/u on his ED visit. At time of f/u, the following issues should be addressed: - assess for resolution of acute sinusitis symptoms. Consider treating for chronic bacterial rhinosinusitis if symptoms persist - assess BP and consider d/c'ing HCTZ if BP is within normal limits  Signed: Elfredia Nevins, MD  PGY-1, Internal Medicine Resident Pager: [phone]) 12/16/2012, 4:32 PM

## 2012-12-16 NOTE — Consult Note (Signed)
Referring Physician: Jeraldine Loots    Chief Complaint: Left facial tingling and dizziness X 3 days  HPI:                                                                                                                                         Todd Mendoza is an 62 y.o. male  With history of smoking and vertigo.  Patient had one episode 4 years ago when he became severly dizzy and was diagnosed with vertigo.  Since that date he has symptoms of dizziness every year about this time of year.  He also confirms recent HA over the past year but never formerly diagnosed with migraines. HA usually lasts for about 2 hours and he treats them with OTC medications. About one year ago he had his usual symptoms of dizziness but this occurrence was associated with speech difficulty and left sided facial droop. This past Friday he woke up, took his wife to work--when he returned home he had a sudden onset "feeling of dizziness and was listing to the left when he walked".  Due to continued left facial "tingling" he was brought to ED by wife. In room he is comfortable but BP 160/112.   Date last known well: 3 days ago Time last known well: 3 days ago tPA Given: No: out of window  Past Medical History  Diagnosis Date  . Vertigo     History reviewed. No pertinent past surgical history.  Family history: Father: HTN,  Mother: HTN, CAD  Social History:  reports that he has been smoking.  He does not have any smokeless tobacco history on file. He reports that he does not drink alcohol or use illicit drugs.  Allergies: No Known Allergies  Medications:                                                                                                                           Current Facility-Administered Medications  Medication Dose Route Frequency Provider Last Rate Last Dose  . HYDROmorphone (DILAUDID) injection 1 mg  1 mg Intramuscular Q2H PRN Gerhard Munch, MD   1 mg at 12/16/12 0915   Current Outpatient  Prescriptions  Medication Sig Dispense Refill  . OVER THE COUNTER MEDICATION Take 3 tablets by mouth every 4 (four) hours as needed (for motion sickness. [employer] store motion sickness tablets).      Marland Kitchen  OVER THE COUNTER MEDICATION Take 1 tablet by mouth daily as needed (for congestion. [employer] store congestion relief).         ROS:                                                                                                                                       History obtained from the patient  General ROS: negative for - chills, fatigue, fever, night sweats, weight gain or weight loss Psychological ROS: negative for - behavioral disorder, hallucinations, memory difficulties, mood swings or suicidal ideation Ophthalmic ROS: positive for - Intermittent blurry vision, double vision,  ENT ROS: negative for - epistaxis, nasal discharge, oral lesions, sore throat, tinnitus or vertigo Allergy and Immunology ROS: negative for - hives or itchy/watery eyes Hematological and Lymphatic ROS: negative for - bleeding problems, bruising or swollen lymph nodes Endocrine ROS: negative for - galactorrhea, hair pattern changes, polydipsia/polyuria or temperature intolerance Respiratory ROS: negative for - cough, hemoptysis, shortness of breath or wheezing Cardiovascular ROS: negative for - chest pain, dyspnea on exertion, edema or irregular heartbeat Gastrointestinal ROS: negative for - abdominal pain, diarrhea, hematemesis, nausea/vomiting or stool incontinence Genito-Urinary ROS: negative for - dysuria, hematuria, incontinence or urinary frequency/urgency Musculoskeletal ROS: negative for - joint swelling or muscular weakness Neurological ROS: as noted in HPI Dermatological ROS: negative for rash and skin lesion changes  Neurologic Examination:                                                                                                      Blood pressure 154/105, pulse 57, temperature 97.6 F (36.4 C),  temperature source Oral, resp. rate 14, SpO2 99.00%.  Mental Status: Alert, oriented, thought content appropriate.  Speech fluent without evidence of aphasia.  Able to follow 3 step commands without difficulty. Cranial Nerves: II: Discs flat bilaterally; Visual fields grossly normal, pupils equal, round, reactive to light and accommodation III,IV, VI: ptosis not present, extra-ocular motions intact bilaterally (possible diplopia on leftward gaze but no clear palsy noted) V,VII: smile symmetric, facial light touch sensation normal bilaterally--without touching his face he feels it is tingling VIII: hearing normal bilaterally IX,X: gag reflex present XI: bilateral shoulder shrug XII: midline tongue extension Motor: Right : Upper extremity   5/5    Left:     Upper extremity   5/5  Lower extremity   5/5     Lower extremity   5/5 Tone and bulk:normal tone throughout; no atrophy noted Sensory: Pinprick and  light touch intact throughout, bilaterally Deep Tendon Reflexes: 2+ and symmetric throughout Plantars: Right: downgoing   Left: downgoing Cerebellar: Dysmetria noted finger-to-nose on left,  normal heel-to-shin test CV: pulses palpable throughout    Results for orders placed during the hospital encounter of 12/16/12 (from the past 48 hour(s))  CBC WITH DIFFERENTIAL     Status: Abnormal   Collection Time    12/16/12  9:06 AM      Result Value Range   WBC 5.0  4.0 - 10.5 K/uL   RBC 4.33  4.22 - 5.81 MIL/uL   Hemoglobin 12.3 (*) 13.0 - 17.0 g/dL   HCT 40.9 (*) 81.1 - 91.4 %   MCV 86.1  78.0 - 100.0 fL   MCH 28.4  26.0 - 34.0 pg   MCHC 33.0  30.0 - 36.0 g/dL   RDW 78.2  95.6 - 21.3 %   Platelets 208  150 - 400 K/uL   Neutrophils Relative 43  43 - 77 %   Lymphocytes Relative 46  12 - 46 %   Monocytes Relative 8  3 - 12 %   Eosinophils Relative 3  0 - 5 %   Basophils Relative 0  0 - 1 %   Neutro Abs 2.2  1.7 - 7.7 K/uL   Lymphs Abs 2.2  0.7 - 4.0 K/uL   Monocytes Absolute 0.4  0.1  - 1.0 K/uL   Eosinophils Absolute 0.2  0.0 - 0.7 K/uL   Basophils Absolute 0.0  0.0 - 0.1 K/uL  COMPREHENSIVE METABOLIC PANEL     Status: Abnormal   Collection Time    12/16/12  9:06 AM      Result Value Range   Sodium 141  135 - 145 mEq/L   Potassium 4.2  3.5 - 5.1 mEq/L   Comment: HEMOLYSIS AT THIS LEVEL MAY AFFECT RESULT   Chloride 107  96 - 112 mEq/L   CO2 27  19 - 32 mEq/L   Glucose, Bld 78  70 - 99 mg/dL   BUN 6  6 - 23 mg/dL   Creatinine, Ser 0.86  0.50 - 1.35 mg/dL   Calcium 9.1  8.4 - 57.8 mg/dL   Total Protein 7.3  6.0 - 8.3 g/dL   Albumin 3.2 (*) 3.5 - 5.2 g/dL   AST 23  0 - 37 U/L   ALT 18  0 - 53 U/L   Alkaline Phosphatase 62  39 - 117 U/L   Total Bilirubin 0.2 (*) 0.3 - 1.2 mg/dL   GFR calc non Af Amer >90  >90 mL/min   GFR calc Af Amer >90  >90 mL/min   Comment:            The eGFR has been calculated     using the CKD EPI equation.     This calculation has not been     validated in all clinical     situations.     eGFR's persistently     <90 mL/min signify     possible Chronic Kidney Disease.  TROPONIN I     Status: None   Collection Time    12/16/12  9:06 AM      Result Value Range   Troponin I <0.30  <0.30 ng/mL   Comment:            Due to the release kinetics of cTnI,     a negative result within the first hours     of the onset of  symptoms does not rule out     myocardial infarction with certainty.     If myocardial infarction is still suspected,     repeat the test at appropriate intervals.   Dg Chest 2 View  12/16/2012  *RADIOLOGY REPORT*  Clinical Data: Dizziness and weakness.  CHEST - 2 VIEW  Comparison: None.  Findings: Two views of the chest were obtained.  Lungs are clear without airspace disease or edema.  Heart size is within normal limits.  Images of the upper abdomen are unremarkable.  Bony thorax is intact. The thoracic aorta is prominent and may be tortuous. Prominent soft tissue along the right side of the mediastinum is probably  vascular in etiology.  IMPRESSION: No acute cardiopulmonary disease.   Original Report Authenticated By: Richarda Overlie, M.D.    Ct Head Wo Contrast  12/16/2012  *RADIOLOGY REPORT*  Clinical Data:  Vertigo.  Recent fall.  Left cheek pain and tenderness.  CT HEAD WITHOUT CONTRAST CT MAXILLOFACIAL WITHOUT CONTRAST  Technique:  Multidetector CT imaging of the head and maxillofacial structures were performed using the standard protocol without intravenous contrast. Multiplanar CT image reconstructions of the maxillofacial structures were also generated.  Comparison:  Head CT from 02/04/2010  CT HEAD  Findings: No evidence for acute hemorrhage, hydrocephalus, mass lesion, abnormal extra-axial fluid collection.  No CT evidence for acute ischemia.  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  No fluid in the mastoid air cells or middle ears.  There is an air- fluid level on the right maxillary sinus, suggesting acute sinusitis.  The frontal and sphenoid sinuses are clear.  No evidence for skull fracture.  IMPRESSION: No acute intracranial abnormality.  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  Air-fluid level of the right maxillary sinus raises a question of acute sinusitis.  CT MAXILLOFACIAL  Findings:   The mandible is intact.  The temporomandibular joints are located.  No zygomatic arch fracture.  No medial or inferior orbital wall blowout fracture is evident.  Mucosal thickening with air-fluid levels noted in both maxillary sinuses.  IMPRESSION: No evidence for facial bone fracture.  Acute on chronic bilateral maxillary sinusitis.   Original Report Authenticated By: Kennith Center, M.D.    Ct Maxillofacial Wo Cm  12/16/2012  *RADIOLOGY REPORT*  Clinical Data:  Vertigo.  Recent fall.  Left cheek pain and tenderness.  CT HEAD WITHOUT CONTRAST CT MAXILLOFACIAL WITHOUT CONTRAST  Technique:  Multidetector CT imaging of the head and maxillofacial structures were performed using the standard protocol without  intravenous contrast. Multiplanar CT image reconstructions of the maxillofacial structures were also generated.  Comparison:  Head CT from 02/04/2010  CT HEAD  Findings: No evidence for acute hemorrhage, hydrocephalus, mass lesion, abnormal extra-axial fluid collection.  No CT evidence for acute ischemia.  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  No fluid in the mastoid air cells or middle ears.  There is an air- fluid level on the right maxillary sinus, suggesting acute sinusitis.  The frontal and sphenoid sinuses are clear.  No evidence for skull fracture.  IMPRESSION: No acute intracranial abnormality.  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  Air-fluid level of the right maxillary sinus raises a question of acute sinusitis.  CT MAXILLOFACIAL  Findings:   The mandible is intact.  The temporomandibular joints are located.  No zygomatic arch fracture.  No medial or inferior orbital wall blowout fracture is evident.  Mucosal thickening with air-fluid levels noted in both maxillary sinuses.  IMPRESSION: No evidence for  facial bone fracture.  Acute on chronic bilateral maxillary sinusitis.   Original Report Authenticated By: Kennith Center, M.D.     Assessment and plan discussed with with attending physician and they are in agreement.    Felicie Morn PA-C [facility] [phone]  12/16/2012, 11:49 AM   Assessment: 62 y.o. male with intermittent dizziness over the past 4 years now presenting with 3 days of feeling dizzy and subjective sensation of left facial "tingling". On exam he shows subjective diplopia with far left gaze and questionable dysmetria with left F-N. 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 Given history and risk factors question concern for possible stroke.   MRI MRA reviewed and was negative for acute stroke. He has a sinusitis and I suspect that this represents  A "Peeling the onion" phenomenon, that is an unmasking of previous  deficits. He has no acute need for admission given that this likely happened several years ago, but he will need to have his risk factors addressed.   Stroke Risk Factors - hypertension and smoking  Plan:  1) Hypertension control per IM team. 2) Would control LDL to less than 100 3) Antiplatelet therapy - asa 81mg .  4) Echocardiogram - could be done as outpatient.  5) No need for dopplers given MRA of neck.  6) Screen for DM and treatment if found.   Ritta Slot, MD [facility] [phone]  If 7pm- 7am, please page neurology on call at [phone].

## 2012-12-17 NOTE — Consult Note (Signed)
Internal Medicine Teaching Service Attending Note Date: 12/17/2012  Patient name: Todd Mendoza  Medical record number: [id number]  Date of birth: [birth year]   I have seen and evaluated Todd Mendoza and discussed their care with the Residency Team.    I saw Todd Mendoza in the ED in consultation, it was deemed unnecessary to admit him at this time.   Todd Mendoza has a PMH of vertigo and presented for evaluation of dizziness and blurry vision.  The symptoms had been present for about 3 days prior to admission and were associated with left sided facial pain.  He first noticed some dizziness upon standing.  He has had symptoms like this before and seem to occur annually around this time of year.  He began taking some OTC sinus medication and noticed that his symptoms were greatly improved and were actually resolved when the IM team saw him in the ED.  He specifically denied any fever, chills, illness, facial pressure or change in sense of smell.  He also denies weakness or numbness.  He does have a previous meclizine prescription for his vertigo.   He denies any loss of bowel or bladder.   For further information, please see resident H&P  Physical Exam: Blood pressure 170/116, pulse 57, temperature 97.8 F (36.6 C), temperature source Oral, resp. rate 16, SpO2 99.00%. General appearance: alert, cooperative and appears stated age Head: Normocephalic, without obvious abnormality, atraumatic Eyes: anicteric, EOMI Lungs: clear to auscultation bilaterally and no wheezing Heart: regular rate Abdomen: soft, NT Pulses: 2+ and symmetric Neurologic: strenght 5/5 throughout, sensation normal, CN grossly normal. RAM normal  Lab results: None  Imaging results:  Dg Chest 2 View  12/16/2012  *RADIOLOGY REPORT*  Clinical Data: Dizziness and weakness.  CHEST - 2 VIEW  Comparison: None.  Findings: Two views of the chest were obtained.  Lungs are clear without airspace disease or edema.  Heart size is  within normal limits.  Images of the upper abdomen are unremarkable.  Bony thorax is intact. The thoracic aorta is prominent and may be tortuous. Prominent soft tissue along the right side of the mediastinum is probably vascular in etiology.  IMPRESSION: No acute cardiopulmonary disease.   Original Report Authenticated By: Richarda Overlie, M.D.    Ct Head Wo Contrast  12/16/2012  *RADIOLOGY REPORT*  Clinical Data:  Vertigo.  Recent fall.  Left cheek pain and tenderness.  CT HEAD WITHOUT CONTRAST CT MAXILLOFACIAL WITHOUT CONTRAST  Technique:  Multidetector CT imaging of the head and maxillofacial structures were performed using the standard protocol without intravenous contrast. Multiplanar CT image reconstructions of the maxillofacial structures were also generated.  Comparison:  Head CT from 02/04/2010  CT HEAD  Findings: No evidence for acute hemorrhage, hydrocephalus, mass lesion, abnormal extra-axial fluid collection.  No CT evidence for acute ischemia.  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  No fluid in the mastoid air cells or middle ears.  There is an air- fluid level on the right maxillary sinus, suggesting acute sinusitis.  The frontal and sphenoid sinuses are clear.  No evidence for skull fracture.  IMPRESSION: No acute intracranial abnormality.  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  Air-fluid level of the right maxillary sinus raises a question of acute sinusitis.  CT MAXILLOFACIAL  Findings:   The mandible is intact.  The temporomandibular joints are located.  No zygomatic arch fracture.  No medial or inferior orbital wall blowout fracture is evident.  Mucosal thickening with air-fluid levels noted in both  maxillary sinuses.  IMPRESSION: No evidence for facial bone fracture.  Acute on chronic bilateral maxillary sinusitis.   Original Report Authenticated By: Kennith Center, M.D.    Mr [facility] Wo Contrast  12/16/2012  *RADIOLOGY REPORT*  Clinical Data:  62 year old male with vertigo.   Headache. Hypertension. Recent fall.  Comparison:  head and face CT 12/16/2012.  Head CT 02/04/2010.  MRI HEAD WITHOUT CONTRAST  Technique: Multiplanar, multiecho pulse sequences of the brain and surrounding structures were obtained according to standard protocol without intravenous contrast.  Findings:  Cerebral volume has not significantly changed since 2011. No restricted diffusion to suggest acute infarction.  No midline shift, mass effect, evidence of mass lesion, ventriculomegaly, extra-axial collection or acute intracranial hemorrhage.  Cervicomedullary junction and pituitary are within normal limits.  Negative visualized cervical spine.  Evidence of occlusion of the nondominant distal left vertebral artery (series 6 image 3).  See MRA findings below.  Other Major intracranial vascular flow voids are preserved.  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  Chronic left lateral medullary (PICA) infarct.  Brain stem elsewhere within normal limits.  Confluent cerebral white matter T2 and FLAIR hyperintensity in a nonspecific pattern.  No supratentorial cortical encephalomalacia.  Mild T2 heterogeneity of the deep gray matter nuclei.  Visualized orbit soft tissues are within normal limits.  Maxillary and ethmoid sinus fluid levels.  Sphenoid and frontal sinuses appear spared.  Mastoids are clear.  Grossly normal visualized internal auditory structures.  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  Elsewhere Visualized bone marrow signal is within normal limits.  IMPRESSION: 1. No acute intracranial abnormality. 2.  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  Evidence of distal left vertebral artery poor flow or occlusion (likely chronic).  See MRA findings below. 3.  Advanced nonspecific cerebral white matter signal changes. Favor chronic small vessel disease. 4.  Bilateral paranasal sinus fluid levels suggestive of acute sinusitis.  MRA NECK  WITHOUT AND WITH CONTRAST  Contrast: 15mL MULTIHANCE GADOBENATE DIMEGLUMINE 529 MG/ML IV SOLN   Technique:  Angiographic images of the neck were obtained using MRA technique without and with intravenous contrast.  Carotid stenosis measurements (when applicable) are obtained utilizing NASCET criteria, using the distal internal carotid diameter as the denominator.  Findings:  Precontrast time-of-flight images reveal antegrade flow signal the cervical carotid arteries.  There is antegrade flow signal in the right vertebral artery.  There is little to no antegrade flow signal in the proximal left vertebral artery.  No time-of-flight flow signal in the left vertebral above the C4 level.  Postcontrast images demonstrate a three-vessel arch configuration. No great vessel origin stenosis.  There is tortuosity of the proximal great vessels.  Right common carotid artery origin within normal limits.  Widely patent right carotid bifurcation.  Tortuous proximal cervical right ICA with a kinked appearance, but otherwise no cervical right ICA stenosis.  Tortuous but otherwise negative left CCA.  Widely patent left carotid bifurcation.  Negative cervical left ICA.  Tortuous brachial cephalic artery with a mildly kinked appearance. No proximal right subclavian artery stenosis.  Normal right vertebral artery origin.  Dominant appearing right vertebral artery is patent throughout the neck and at the skull base.  Mildly irregular proximal left subclavian artery without stenosis. Faint irregular enhancement of the left vertebral artery throughout its course with evidence of numerous severe tandem stenoses.  Low enhancement in the left V3 segment.  There is better enhancement in the left V4 segment  with evidence of tandem to the severe intracranial stenoses.  See intracranial findings below.  IMPRESSION: 1. MRA appearance suggesting trickle flow in the left vertebral artery throughout its course.  Tandem stenoses in the intracranial  segment of the left vertebral arteries suspected:  See intracranial findings below. 2.  Otherwise no hemodynamically significant carotid or vertebral artery stenosis in the neck.  Tortuous great vessels.  MRA HEAD WITHOUT CONTRAST  Technique: Angiographic images of the Circle of Willis were obtained using MRA technique without  intravenous contrast.  Findings:  Antegrade flow in the dominant distal right vertebral artery.  There is flow signal evident in the left PICA.  No antegrade flow signal in the distal left vertebral artery.  There is flow in a portion of the right PICA, but not at the right PICA origin.  Basilar artery is patent.  AICA origins are patent.  Basilar artery irregularity without significant stenosis.  SCA and PCA origins are within normal limits.  Posterior communicating arteries are diminutive or absent.  Bilateral PCA branches are within normal limits.  Antegrade flow in both ICA siphons.  Moderate cavernous segment irregularity bilaterally.  Unusual lateral left ophthalmic artery origin.  Conventional right ophthalmic artery origin.  Probable atherosclerotic pseudo-lesion directed medially at the level of the left ophthalmic artery origin.  Overall mild bilateral supraclinoid ICA stenosis, greater on the right.  Carotid termini are patent.  MCA and ACA origins are normal. Anterior communicating artery and visualized ACA branches are within normal limits.  Visualized bilateral MCA branches are within normal limits.  IMPRESSION: 1.  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 2.  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  There is some flow signal detected in both PICA vessels. 3.  Moderate irregularity of the bilateral cavernous and supraclinoid ICA, with mild stenosis right greater than left. 4. 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   Original Report Authenticated By: Erskine Speed, M.D.    Mr Angiogram Neck W Wo Contrast  12/16/2012  *RADIOLOGY REPORT*  Clinical Data:  62 year old male with vertigo.  Headache. Hypertension. Recent fall.  Comparison:  head and face CT 12/16/2012.  Head CT 02/04/2010.  MRI HEAD WITHOUT CONTRAST  Technique: Multiplanar, multiecho pulse sequences of the brain and surrounding structures were obtained according to standard protocol without intravenous contrast.  Findings:  Cerebral volume has not significantly changed since 2011. No restricted diffusion to suggest acute infarction.  No midline shift, mass effect, evidence of mass lesion, ventriculomegaly, extra-axial collection or acute intracranial hemorrhage.  Cervicomedullary junction and pituitary are within normal limits.  Negative visualized cervical spine.  Evidence of occlusion of the nondominant distal left vertebral artery (series 6 image 3).  See MRA findings below.  Other Major intracranial vascular flow voids are preserved.  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  Chronic left lateral medullary (PICA) infarct.  Brain stem elsewhere within normal limits.  Confluent cerebral white matter T2 and FLAIR hyperintensity in a nonspecific pattern.  No supratentorial cortical encephalomalacia.  Mild T2 heterogeneity of the deep gray matter nuclei.  Visualized orbit soft tissues are within normal limits.  Maxillary and ethmoid sinus fluid levels.  Sphenoid and frontal sinuses appear spared.  Mastoids are clear.  Grossly normal visualized internal auditory structures.  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  Elsewhere Visualized bone marrow signal is within normal limits.  IMPRESSION: 1. No acute  intracranial abnormality. 2.  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  Evidence of distal left vertebral artery poor flow or occlusion (likely chronic).  See MRA findings below. 3.  Advanced nonspecific cerebral white matter signal  changes. Favor chronic small vessel disease. 4.  Bilateral paranasal sinus fluid levels suggestive of acute sinusitis.  MRA NECK WITHOUT AND WITH CONTRAST  Contrast: 15mL MULTIHANCE GADOBENATE DIMEGLUMINE 529 MG/ML IV SOLN   Technique:  Angiographic images of the neck were obtained using MRA technique without and with intravenous contrast.  Carotid stenosis measurements (when applicable) are obtained utilizing NASCET criteria, using the distal internal carotid diameter as the denominator.  Findings:  Precontrast time-of-flight images reveal antegrade flow signal the cervical carotid arteries.  There is antegrade flow signal in the right vertebral artery.  There is little to no antegrade flow signal in the proximal left vertebral artery.  No time-of-flight flow signal in the left vertebral above the C4 level.  Postcontrast images demonstrate a three-vessel arch configuration. No great vessel origin stenosis.  There is tortuosity of the proximal great vessels.  Right common carotid artery origin within normal limits.  Widely patent right carotid bifurcation.  Tortuous proximal cervical right ICA with a kinked appearance, but otherwise no cervical right ICA stenosis.  Tortuous but otherwise negative left CCA.  Widely patent left carotid bifurcation.  Negative cervical left ICA.  Tortuous brachial cephalic artery with a mildly kinked appearance. No proximal right subclavian artery stenosis.  Normal right vertebral artery origin.  Dominant appearing right vertebral artery is patent throughout the neck and at the skull base.  Mildly irregular proximal left subclavian artery without stenosis. Faint irregular enhancement of the left vertebral artery throughout its course with evidence of numerous severe tandem stenoses.  Low enhancement in the left V3 segment.  There is better enhancement in the left V4 segment with evidence of tandem to the severe intracranial stenoses.  See intracranial findings below.  IMPRESSION: 1. MRA  appearance suggesting trickle flow in the left vertebral artery throughout its course.  Tandem stenoses in the intracranial segment of the left vertebral arteries suspected:  See intracranial findings below. 2.  Otherwise no hemodynamically significant carotid or vertebral artery stenosis in the neck.  Tortuous great vessels.  MRA HEAD WITHOUT CONTRAST  Technique: Angiographic images of the Circle of Willis were obtained using MRA technique without  intravenous contrast.  Findings:  Antegrade flow in the dominant distal right vertebral artery.  There is flow signal evident in the left PICA.  No antegrade flow signal in the distal left vertebral artery.  There is flow in a portion of the right PICA, but not at the right PICA origin.  Basilar artery is patent.  AICA origins are patent.  Basilar artery irregularity without significant stenosis.  SCA and PCA origins are within normal limits.  Posterior communicating arteries are diminutive or absent.  Bilateral PCA branches are within normal limits.  Antegrade flow in both ICA siphons.  Moderate cavernous segment irregularity bilaterally.  Unusual lateral left ophthalmic artery origin.  Conventional right ophthalmic artery origin.  Probable atherosclerotic pseudo-lesion directed medially at the level of the left ophthalmic artery origin.  Overall mild bilateral supraclinoid ICA stenosis, greater on the right.  Carotid termini are patent.  MCA and ACA origins are normal. Anterior communicating artery and visualized ACA branches are within normal limits.  Visualized bilateral MCA branches are within normal limits.  IMPRESSION: 1.  Suspect retrograde supply to the diseased distal left  vertebral artery. 2.  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  There is some flow signal detected in both PICA vessels. 3.  Moderate irregularity of the bilateral cavernous and supraclinoid ICA, with mild stenosis right greater than left. 4. Incidental unusual laterally directed origin of  the left ophthalmic artery, with probable adjacent atherosclerotic pseudo- lesion.   Original Report Authenticated By: Erskine Speed, M.D.    Mr Brain Wo Contrast  12/16/2012  *RADIOLOGY REPORT*  Clinical Data:  61 year old male with vertigo.  Headache. Hypertension. Recent fall.  Comparison:  head and face CT 12/16/2012.  Head CT 02/04/2010.  MRI HEAD WITHOUT CONTRAST  Technique: Multiplanar, multiecho pulse sequences of the brain and surrounding structures were obtained according to standard protocol without intravenous contrast.  Findings:  Cerebral volume has not significantly changed since 2011. No restricted diffusion to suggest acute infarction.  No midline shift, mass effect, evidence of mass lesion, ventriculomegaly, extra-axial collection or acute intracranial hemorrhage.  Cervicomedullary junction and pituitary are within normal limits.  Negative visualized cervical spine.  Evidence of occlusion of the nondominant distal left vertebral artery (series 6 image 3).  See MRA findings below.  Other Major intracranial vascular flow voids are preserved.  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  Chronic left lateral medullary (PICA) infarct.  Brain stem elsewhere within normal limits.  Confluent cerebral white matter T2 and FLAIR hyperintensity in a nonspecific pattern.  No supratentorial cortical encephalomalacia.  Mild T2 heterogeneity of the deep gray matter nuclei.  Visualized orbit soft tissues are within normal limits.  Maxillary and ethmoid sinus fluid levels.  Sphenoid and frontal sinuses appear spared.  Mastoids are clear.  Grossly normal visualized internal auditory structures.  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  Elsewhere Visualized bone marrow signal is within normal limits.  IMPRESSION: 1. No acute intracranial abnormality. 2.  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  Evidence of distal left vertebral artery poor flow  or occlusion (likely chronic).  See MRA findings below. 3.  Advanced nonspecific cerebral white matter signal changes. Favor chronic small vessel disease. 4.  Bilateral paranasal sinus fluid levels suggestive of acute sinusitis.  MRA NECK WITHOUT AND WITH CONTRAST  Contrast: 15mL MULTIHANCE GADOBENATE DIMEGLUMINE 529 MG/ML IV SOLN   Technique:  Angiographic images of the neck were obtained using MRA technique without and with intravenous contrast.  Carotid stenosis measurements (when applicable) are obtained utilizing NASCET criteria, using the distal internal carotid diameter as the denominator.  Findings:  Precontrast time-of-flight images reveal antegrade flow signal the cervical carotid arteries.  There is antegrade flow signal in the right vertebral artery.  There is little to no antegrade flow signal in the proximal left vertebral artery.  No time-of-flight flow signal in the left vertebral above the C4 level.  Postcontrast images demonstrate a three-vessel arch configuration. No great vessel origin stenosis.  There is tortuosity of the proximal great vessels.  Right common carotid artery origin within normal limits.  Widely patent right carotid bifurcation.  Tortuous proximal cervical right ICA with a kinked appearance, but otherwise no cervical right ICA stenosis.  Tortuous but otherwise negative left CCA.  Widely patent left carotid bifurcation.  Negative cervical left ICA.  Tortuous brachial cephalic artery with a mildly kinked appearance. No proximal right subclavian artery stenosis.  Normal right vertebral artery origin.  Dominant appearing right vertebral artery is patent throughout the neck and at the skull base.  Mildly irregular proximal left  subclavian artery without stenosis. Faint irregular enhancement of the left vertebral artery throughout its course with evidence of numerous severe tandem stenoses.  Low enhancement in the left V3 segment.  There is better enhancement in the left V4 segment with  evidence of tandem to the severe intracranial stenoses.  See intracranial findings below.  IMPRESSION: 1. MRA appearance suggesting trickle flow in the left vertebral artery throughout its course.  Tandem stenoses in the intracranial segment of the left vertebral arteries suspected:  See intracranial findings below. 2.  Otherwise no hemodynamically significant carotid or vertebral artery stenosis in the neck.  Tortuous great vessels.  MRA HEAD WITHOUT CONTRAST  Technique: Angiographic images of the Circle of Willis were obtained using MRA technique without  intravenous contrast.  Findings:  Antegrade flow in the dominant distal right vertebral artery.  There is flow signal evident in the left PICA.  No antegrade flow signal in the distal left vertebral artery.  There is flow in a portion of the right PICA, but not at the right PICA origin.  Basilar artery is patent.  AICA origins are patent.  Basilar artery irregularity without significant stenosis.  SCA and PCA origins are within normal limits.  Posterior communicating arteries are diminutive or absent.  Bilateral PCA branches are within normal limits.  Antegrade flow in both ICA siphons.  Moderate cavernous segment irregularity bilaterally.  Unusual lateral left ophthalmic artery origin.  Conventional right ophthalmic artery origin.  Probable atherosclerotic pseudo-lesion directed medially at the level of the left ophthalmic artery origin.  Overall mild bilateral supraclinoid ICA stenosis, greater on the right.  Carotid termini are patent.  MCA and ACA origins are normal. Anterior communicating artery and visualized ACA branches are within normal limits.  Visualized bilateral MCA branches are within normal limits.  IMPRESSION: 1.  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 2.  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  There is some flow signal detected in both PICA vessels. 3.  Moderate irregularity of the bilateral cavernous and  supraclinoid ICA, with mild stenosis right greater than left. 4. 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   Original Report Authenticated By: Erskine Speed, M.D.    Ct Maxillofacial Wo Cm  12/16/2012  *RADIOLOGY REPORT*  Clinical Data:  Vertigo.  Recent fall.  Left cheek pain and tenderness.  CT HEAD WITHOUT CONTRAST CT MAXILLOFACIAL WITHOUT CONTRAST  Technique:  Multidetector CT imaging of the head and maxillofacial structures were performed using the standard protocol without intravenous contrast. Multiplanar CT image reconstructions of the maxillofacial structures were also generated.  Comparison:  Head CT from 02/04/2010  CT HEAD  Findings: No evidence for acute hemorrhage, hydrocephalus, mass lesion, abnormal extra-axial fluid collection.  No CT evidence for acute ischemia.  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  No fluid in the mastoid air cells or middle ears.  There is an air- fluid level on the right maxillary sinus, suggesting acute sinusitis.  The frontal and sphenoid sinuses are clear.  No evidence for skull fracture.  IMPRESSION: No acute intracranial abnormality.  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  Air-fluid level of the right maxillary sinus raises a question of acute sinusitis.  CT MAXILLOFACIAL  Findings:   The mandible is intact.  The temporomandibular joints are located.  No zygomatic arch fracture.  No medial or inferior orbital wall blowout fracture is evident.  Mucosal thickening with air-fluid levels noted  in both maxillary sinuses.  IMPRESSION: No evidence for facial bone fracture.  Acute on chronic bilateral maxillary sinusitis.   Original Report Authenticated By: Kennith Center, M.D.     Assessment and Plan: I agree with the formulated Assessment and Plan with the following changes:   Agree with plan as documented in resident note.   His symptoms are likely related to  acute on chronic sinusitis.  He had images of the head which reveal chronic vascular changes, which do need to be managed, but can be done so as an outpatient.  He will receive therapy for his acute sinusitis as shown on CT.    Inez Catalina, MD 3/25/20142:55 PM

## 2012-12-26 ENCOUNTER — Encounter: Payer: Self-pay | Admitting: Internal Medicine

## 2012-12-26 ENCOUNTER — Ambulatory Visit (INDEPENDENT_AMBULATORY_CARE_PROVIDER_SITE_OTHER): Payer: Self-pay | Admitting: Internal Medicine

## 2012-12-26 VITALS — BP 134/85 | HR 93 | Temp 98.8°F | Ht 70.0 in | Wt 181.5 lb

## 2012-12-26 DIAGNOSIS — J309 Allergic rhinitis, unspecified: Secondary | ICD-10-CM

## 2012-12-26 DIAGNOSIS — R42 Dizziness and giddiness: Secondary | ICD-10-CM

## 2012-12-26 DIAGNOSIS — Z8673 Personal history of transient ischemic attack (TIA), and cerebral infarction without residual deficits: Secondary | ICD-10-CM | POA: Insufficient documentation

## 2012-12-26 DIAGNOSIS — I639 Cerebral infarction, unspecified: Secondary | ICD-10-CM

## 2012-12-26 DIAGNOSIS — I10 Essential (primary) hypertension: Secondary | ICD-10-CM

## 2012-12-26 DIAGNOSIS — I6350 Cerebral infarction due to unspecified occlusion or stenosis of unspecified cerebral artery: Secondary | ICD-10-CM

## 2012-12-26 DIAGNOSIS — E785 Hyperlipidemia, unspecified: Secondary | ICD-10-CM

## 2012-12-26 NOTE — Progress Notes (Signed)
Patient ID: Todd Mendoza, male   DOB: 06-Jul-1951, 62 y.o.   MRN: [id number]  Subjective:   Patient ID: Todd Mendoza male   DOB: 1951-02-28 62 y.o.   MRN: [id number]  HPI: Mr.Todd Mendoza is a 62 y.o. M with PMH vertigo and remote cerebellar infarct presents as a hospital f/u after being seen on 3/24 for dizziness, blurry vision, and left sided facial pain, where he was diagnosed with a sinus infection seen on CT imaging. He was treated with Prednisone and Amoxicillin and told to use Afrin PRN. He was also started on ASA 81mg  and Atorvastatin 40mg  daily given his CVA history. His blood pressure was also noted to be elevated in the ED, possibly related phenylephrine use, and was started on 25mg  HCTZ.   He states that he is feeling much better and only has occasional dizziness, which he describes as inability to stand. He denies any feeling of his head spinning. He does endorse difficulty standing and ambulating with his dizzy spells, and endorses dizziness while lying down when these spells occur. Over, his dizziness has improved after taking the Amoxicillin and Prednisone.  At this time Mr. Todd Mendoza states that he is unsure if he would like to continue to follow up in [facility] He's trying to get the orange card but states he's unsure if he'll qualify. His wife states that he will be following up with [place] as his PCP.  Past Medical History  Diagnosis Date  . Vertigo    Current Outpatient Prescriptions  Medication Sig Dispense Refill  . amoxicillin (AMOXIL) 500 MG capsule Take 1 capsule (500 mg total) by mouth 2 (two) times daily.  14 capsule  0  . aspirin EC 81 MG tablet Take 1 tablet (81 mg total) by mouth daily.  30 tablet  0  . atorvastatin (LIPITOR) 40 MG tablet Take 1 tablet (40 mg total) by mouth daily.  30 tablet  0  . hydrochlorothiazide (HYDRODIURIL) 25 MG tablet Take 1 tablet (25 mg total) by mouth daily.  30 tablet  0  . OVER THE COUNTER MEDICATION Take 3 tablets by mouth every 4 (four) hours  as needed (for motion sickness. dollar store motion sickness tablets).      Marland Kitchen oxymetazoline (AFRIN NASAL SPRAY) 0.05 % nasal spray Place 2 sprays into the nose 2 (two) times daily.  30 mL  0  . predniSONE (DELTASONE) 20 MG tablet 2 tabs po daily x 3 days  6 tablet  0  . sodium chloride (OCEAN NASAL SPRAY) 0.65 % nasal spray Place 1 spray into the nose as needed for congestion.  30 mL  12   No current facility-administered medications for this visit.   No family history on file. History   Social History  . Marital Status: Single    Spouse Name: N/A    Number of Children: N/A  . Years of Education: N/A   Social History Main Topics  . Smoking status: Current Every Day Smoker -- 0.20 packs/day    Types: Cigarettes  . Smokeless tobacco: None  . Alcohol Use: No  . Drug Use: No  . Sexually Active: None   Other Topics Concern  . None   Social History Narrative  . None   Review of Systems: Constitutional: Denies fever, chills, diaphoresis, appetite change and fatigue.  HEENT: Denies photophobia, eye pain, redness, hearing loss, ear pain, +congestion- improved, sore throat, rhinorrhea, sneezing, mouth sores, trouble swallowing, neck pain, neck stiffness and tinnitus.   Respiratory: Denies SOB,  DOE, cough, chest tightness,  and wheezing.   Cardiovascular: Denies chest pain, palpitations and leg swelling.  Gastrointestinal: Denies nausea, vomiting, abdominal pain, diarrhea, constipation, blood in stool and abdominal distention.  Genitourinary: Denies dysuria, urgency, frequency, hematuria, flank pain and difficulty urinating.  Musculoskeletal: Denies myalgias, back pain, joint swelling, arthralgias and gait problem.  Skin: Denies pallor, rash and wound.  Neurological: + dizziness intermittently but improved since seen in the ED. Denies seizures, syncope, weakness, numbness and headaches.  Hematological: Denies adenopathy. Easy bruising, personal or family bleeding history   Psychiatric/Behavioral: Denies suicidal ideation, mood changes, confusion, nervousness, sleep disturbance and agitation  Objective:  Physical Exam: Filed Vitals:   12/26/12 1548  BP: 134/85  Pulse: 93  Temp: 98.8 F (37.1 C)  TempSrc: Oral  Height: 5\' 10"  (1.778 m)  Weight: 181 lb 8 oz (82.328 kg)  SpO2: 95%   Constitutional: Vital signs reviewed.  Patient is a well-developed and well-nourished male in no acute distress and cooperative with exam. Alert and oriented x3.  Head: Normocephalic and atraumatic Mouth: Poor dentition  Eyes: PERRL, EOMI, conjunctivae normal, No scleral icterus.  Neck: Supple, Trachea midline normal ROM, No JVD, mass, thyromegaly Cardiovascular: RRR, S1 normal, S2 normal, no MRG, pulses symmetric and intact bilaterally Pulmonary/Chest: CTAB, no wheezes, rales, or rhonchi Abdominal: Soft. Non-tender, non-distended, no masses, organomegaly, or guarding present.  Musculoskeletal: No joint deformities, erythema, or stiffness, ROM full and no nontender Hematology: no cervical adenopathy.  Neurological: A&O x3, Strength is normal and symmetric bilaterally, cranial nerve II-XII are grossly intact, non focal.  Skin: Warm, dry and intact. No rash, cyanosis, or clubbing.  Psychiatric: Normal mood and affect.   Assessment & Plan:   Please refer to Problem List based Assessment and Plan

## 2012-12-26 NOTE — Assessment & Plan Note (Signed)
Aspirin 81 mg and atorvastatin 40 mg daily were started at his ED visit on 12/16/2012. His direct LDL was 109. Will continue these medications.

## 2012-12-26 NOTE — Assessment & Plan Note (Signed)
He was started on atorvastatin 40 mg daily in the ED on 12/16/2012. With an LDL of 109 will continue the atorvastatin especially given his history of a previous CVA.

## 2012-12-26 NOTE — Assessment & Plan Note (Signed)
Possibly symptoms more related to his recurrent sinusitis this time of year versus his basilar artery narrowing seen on imaging his last ED visit on 12/16/2012. Per patient his dizziness occurs primarily this time of year.

## 2012-12-26 NOTE — Patient Instructions (Signed)
Continue to take the Asprin, the HCTZ (for blood pressure), and the Atrovastatin (for cholesterol).

## 2012-12-26 NOTE — Assessment & Plan Note (Signed)
His blood pressure was significantly elevated 170/116 at his last ED visit on 12/16/2012. He was started on HCTZ 25 mg daily. Today his blood pressure is significantly improved at 134/85. Will continue the HCTZ 25 mg daily

## 2012-12-30 NOTE — Progress Notes (Signed)
I saw and evaluated the patient.  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  The assessment, diagnosis, and plan were formulated together and I agree with the documentation in the resident's note. 

## 2013-06-26 ENCOUNTER — Encounter: Payer: Self-pay | Admitting: Internal Medicine

## 2013-07-03 ENCOUNTER — Encounter: Payer: Self-pay | Admitting: Internal Medicine

## 2014-09-10 ENCOUNTER — Encounter: Payer: Self-pay | Admitting: Internal Medicine

## 2014-09-10 ENCOUNTER — Ambulatory Visit (INDEPENDENT_AMBULATORY_CARE_PROVIDER_SITE_OTHER): Payer: Self-pay | Admitting: Internal Medicine

## 2014-09-10 VITALS — BP 129/83 | HR 77 | Temp 98.0°F | Ht 70.0 in | Wt 188.0 lb

## 2014-09-10 DIAGNOSIS — I10 Essential (primary) hypertension: Secondary | ICD-10-CM

## 2014-09-10 DIAGNOSIS — Z8673 Personal history of transient ischemic attack (TIA), and cerebral infarction without residual deficits: Secondary | ICD-10-CM

## 2014-09-10 DIAGNOSIS — E785 Hyperlipidemia, unspecified: Secondary | ICD-10-CM

## 2014-09-10 DIAGNOSIS — Z0000 Encounter for general adult medical examination without abnormal findings: Secondary | ICD-10-CM | POA: Insufficient documentation

## 2014-09-10 MED ORDER — HYDROCHLOROTHIAZIDE 25 MG PO TABS: 25.0000 mg | ORAL_TABLET | Freq: Every day | ORAL | Status: DC

## 2014-09-10 MED ORDER — ATORVASTATIN CALCIUM 40 MG PO TABS: 40.0000 mg | ORAL_TABLET | Freq: Every day | ORAL | Status: DC

## 2014-09-10 NOTE — Assessment & Plan Note (Signed)
BP Readings from Last 3 Encounters:  09/10/14 129/83  12/26/12 134/85  12/16/12 170/116    Lab Results  Component Value Date   NA 141 12/16/2012   K 4.2 12/16/2012   CREATININE 0.86 12/16/2012    Assessment: Blood pressure control: controlled Progress toward BP goal:  at goal Comments: Pt off HCTZ but endorses elevated blood pressures when he checks  Plan: Medications:  Restart HCTZ 25mg  daily Educational resources provided: brochure, handout, video Self management tools provided:   Other plans: F/u in 3 months for BP check and BMP to assess renal function and electrolytes.

## 2014-09-10 NOTE — Assessment & Plan Note (Signed)
Restarting stain given his CVA history to risk stratify the patient. - Start atorvastatin 40mg  po daily

## 2014-09-10 NOTE — Progress Notes (Signed)
Patient ID: Todd Mendoza, male   DOB: 23-Apr-1951, 63 y.o.   MRN: [id number]  Subjective:   Patient ID: Todd Mendoza male   DOB: January 04, 1951 63 y.o.   MRN: [id number]  HPI: Mr.Todd Mendoza is a 63 y.o. M with PMH vertigo and remote cerebellar infarct presents for a routine follow up.  He states that he is feeling well. He is only taking ASA 81mg  daily. He had been on HCTZ and a statin as well. He does smoke 2 cigarettes a day. He states that he measures his BP at the pharmacy occasionally, and it is elevated at times.   He denies any more dizziness or vertigo, denies syncope.   Pt denies fevers, chills, headaches, vision changes, chest pain, palpitations, wheezing, abdominal pain, nausea, vomiting, constipation, diarrhea, dark tarry stools, difficulty or pain with urination, peripheral edema, numbness or tingling, or difficulty ambulating.   Past Medical History  Diagnosis Date  . Vertigo    Current Outpatient Prescriptions  Medication Sig Dispense Refill  . amoxicillin (AMOXIL) 500 MG capsule Take 1 capsule (500 mg total) by mouth 2 (two) times daily. 14 capsule 0  . aspirin EC 81 MG tablet Take 1 tablet (81 mg total) by mouth daily. 30 tablet 0  . atorvastatin (LIPITOR) 40 MG tablet Take 1 tablet (40 mg total) by mouth daily. 30 tablet 0  . hydrochlorothiazide (HYDRODIURIL) 25 MG tablet Take 1 tablet (25 mg total) by mouth daily. 30 tablet 0  . OVER THE COUNTER MEDICATION Take 3 tablets by mouth every 4 (four) hours as needed (for motion sickness. dollar store motion sickness tablets).    Marland Kitchen oxymetazoline (AFRIN NASAL SPRAY) 0.05 % nasal spray Place 2 sprays into the nose 2 (two) times daily. 30 mL 0  . predniSONE (DELTASONE) 20 MG tablet 2 tabs po daily x 3 days 6 tablet 0  . sodium chloride (OCEAN NASAL SPRAY) 0.65 % nasal spray Place 1 spray into the nose as needed for congestion. 30 mL 12   No current facility-administered medications for this visit.   No family history on  file. History   Social History  . Marital Status: Single    Spouse Name: N/A    Number of Children: N/A  . Years of Education: N/A   Social History Main Topics  . Smoking status: Current Every Day Smoker -- 0.20 packs/day    Types: Cigarettes  . Smokeless tobacco: None  . Alcohol Use: No  . Drug Use: No  . Sexual Activity: None   Other Topics Concern  . None   Social History Narrative   Review of Systems: A 12 point ROS was performed; pertinent positives and negatives were noted in the HPI   Objective:  Physical Exam: Filed Vitals:   09/10/14 1600  BP: 129/83  Pulse: 77  Temp: 98 F (36.7 C)  TempSrc: Oral  Height: 5\' 10"  (1.778 m)  Weight: 188 lb (85.276 kg)  SpO2: 97%   Constitutional: Vital signs reviewed.  Patient is a well-developed and well-nourished male in no acute distress and cooperative with exam. Alert and oriented x3.  Head: Normocephalic and atraumatic Mouth: No erythema or exudates, MMM. Poor dentition. Eyes: PERRL, EOMI. No scleral icterus.  Neck: Normal ROM Cardiovascular: RRR, no MRG Pulmonary/Chest: Normal respiratory effort, CTAB, no wheezes, rales, or rhonchi Abdominal: Soft. Non-tender, non-distended, bowel sounds are normal, no guarding present.  Musculoskeletal: No joint deformities, erythema, or stiffness. Neurological: A&O x3, Strength is normal and symmetric bilaterally, cranial nerve II-XI  are grossly intact, tongue deviates to the right, no focal motor deficit.  Skin: Warm, dry and intact.  Psychiatric: Normal mood and affect. Speech and behavior is normal.    Assessment & Plan:   Please refer to Problem List based Assessment and Plan

## 2014-09-10 NOTE — Assessment & Plan Note (Addendum)
Pt left the clinic before being able to address healthcare maintenance issues: He needs a flu shot, Tdap, zostavax, and colonoscopy. Pt without insurance currently.  - Will contact the pt to return for vaccinations, esp influenza - Will address others at his next visit in 3 months.

## 2014-09-10 NOTE — Patient Instructions (Signed)
Begin taking the HCTZ for your blood pressure.  Begin taking the atorvastatin for your cholesterol.  Please be sure to follow up in the clinic in 3 months.  General Instructions:   Please bring your medicines with you each time you come to clinic.  Medicines may include prescription medications, over-the-counter medications, herbal remedies, eye drops, vitamins, or other pills.   Progress Toward Treatment Goals:  No flowsheet data found.  Self Care Goals & Plans:  Self Care Goal 09/10/2014  Manage my medications follow the sick day instructions if I am sick  Monitor my health keep track of my weight  Eat healthy foods eat more vegetables; eat fruit for snacks and desserts; eat baked foods instead of fried foods  Be physically active find an activity I enjoy    No flowsheet data found.   Care Management & Community Referrals:  No flowsheet data found.

## 2014-09-11 NOTE — Progress Notes (Signed)
Medicine attending: Medical history, presenting problems, physical findings, and medications, reviewed with Dr Lennart Pall and I concur with her evaluation and management plan.

## 2015-02-10 ENCOUNTER — Encounter: Payer: Self-pay | Admitting: *Deleted

## 2016-01-18 ENCOUNTER — Encounter (HOSPITAL_COMMUNITY): Payer: Self-pay | Admitting: *Deleted

## 2016-01-18 ENCOUNTER — Emergency Department (HOSPITAL_COMMUNITY)
Admission: EM | Admit: 2016-01-18 | Discharge: 2016-01-18 | Disposition: A | Discharge status: Home / Self Care | Payer: Medicare HMO | Attending: Emergency Medicine | Admitting: Emergency Medicine

## 2016-01-18 DIAGNOSIS — Z7982 Long term (current) use of aspirin: Secondary | ICD-10-CM | POA: Insufficient documentation

## 2016-01-18 DIAGNOSIS — Z79899 Other long term (current) drug therapy: Secondary | ICD-10-CM | POA: Diagnosis not present

## 2016-01-18 DIAGNOSIS — Z87891 Personal history of nicotine dependence: Secondary | ICD-10-CM | POA: Diagnosis not present

## 2016-01-18 DIAGNOSIS — L0291 Cutaneous abscess, unspecified: Secondary | ICD-10-CM

## 2016-01-18 DIAGNOSIS — L02212 Cutaneous abscess of back [any part, except buttock]: Secondary | ICD-10-CM | POA: Insufficient documentation

## 2016-01-18 MED ORDER — LIDOCAINE-EPINEPHRINE 2 %-1:100000 IJ SOLN: 20.0000 mL | Freq: Once | INTRAMUSCULAR | Status: DC

## 2016-01-18 MED ORDER — LIDOCAINE-EPINEPHRINE (PF) 1 %-1:200000 IJ SOLN
INTRAMUSCULAR | Status: AC
  Filled 2016-01-18: qty 30

## 2016-01-18 MED ORDER — LIDOCAINE-EPINEPHRINE 1 %-1:100000 IJ SOLN
INTRAMUSCULAR | Status: AC
  Administered 2016-01-18: 3 mL
  Filled 2016-01-18: qty 1

## 2016-01-18 NOTE — ED Provider Notes (Signed)
CSN: [id number]     Arrival date & time 01/18/16  [id number] History   First MD Initiated Contact with Patient 01/18/16 [phone]     Chief Complaint  Patient presents with  . Abscess     (Consider location/radiation/quality/duration/timing/severity/associated sxs/prior Treatment) HPI   Ta Todd Mendoza is a 65 y.o. male who presents for evaluation of a painful swollen area on his upper back. Onset 3 days ago. No known trauma. Similar past, treated symptomatically. He denies fever, chills, nausea, vomiting, weakness or dizziness. There are no other known modifying factors.   Past Medical History  Diagnosis Date  . Vertigo    History reviewed. No pertinent past surgical history. No family history on file. Social History  Substance Use Topics  . Smoking status: Former Smoker -- 0.20 packs/day    Types: Cigarettes  . Smokeless tobacco: None  . Alcohol Use: No    Review of Systems  All other systems reviewed and are negative.     Allergies  Review of patient's allergies indicates no known allergies.  Home Medications   Prior to Admission medications   Medication Sig Start Date End Date Taking? Authorizing Provider  aspirin EC 81 MG tablet Take 1 tablet (81 mg total) by mouth daily. 12/16/12   Neema Bobbie Stack, MD  atorvastatin (LIPITOR) 40 MG tablet Take 1 tablet (40 mg total) by mouth daily. 09/10/14   Otho Bellows, MD  hydrochlorothiazide (HYDRODIURIL) 25 MG tablet Take 1 tablet (25 mg total) by mouth daily. 09/10/14   Otho Bellows, MD   BP 160/98 mmHg  Pulse 81  Temp(Src) 97.4 F (36.3 C) (Oral)  SpO2 98% Physical Exam  Constitutional: He is oriented to person, place, and time. He appears well-developed and well-nourished. No distress.  HENT:  Head: Normocephalic and atraumatic.  Right Ear: External ear normal.  Left Ear: External ear normal.  Eyes: Conjunctivae and EOM are normal. Pupils are equal, round, and reactive to light.  Neck: Normal range of motion and  phonation normal. Neck supple.  Cardiovascular: Normal rate.   Pulmonary/Chest: Effort normal. He exhibits no bony tenderness.  Musculoskeletal: Normal range of motion.  Neurological: He is alert and oriented to person, place, and time. No cranial nerve deficit or sensory deficit. He exhibits normal muscle tone. Coordination normal.  Skin: Skin is warm, dry and intact.  Fluctuant mass, just medial to the left scapula, about 5 cm diameter, draining somewhat in the inferior aspect.  Psychiatric: He has a normal mood and affect. His behavior is normal. Judgment and thought content normal.  Nursing note and vitals reviewed.   ED Course  Procedures (including critical care time)   Medications  lidocaine-EPINEPHrine (XYLOCAINE W/EPI) 2 %-1:100000 (with pres) injection 20 mL (not administered)    Patient Vitals for the past 24 hrs:  BP Temp Temp src Pulse SpO2  01/18/16 0733 160/98 mmHg 97.4 F (36.3 C) Oral 81 98 %    INCISION AND DRAINAGE Performed by: Richarda Blade Consent: Verbal consent obtained. Risks and benefits: risks, benefits and alternatives were discussed Type: abscess  Body area: upper back  Anesthesia: local infiltration  Incision was made with a scalpel.  Local anesthetic: lidocaine 1% with epinephrine  Anesthetic total: 3 ml  Complexity: complex Blunt dissection to break up loculations  Drainage: purulent  Drainage amount: moderate  Packing not indicated  Patient tolerance: Patient tolerated the procedure well with no immediate complications.    9:57 AM Reevaluation with update and discussion. After initial assessment and  treatment, an updated evaluation reveals Patient tolerated procedure well and has no further complaints. Findings discussed with patient and wife, all questions were answered. Cement Review Labs Reviewed - No data to display  Imaging Review No results found. I have personally reviewed and evaluated these  images and lab results as part of my medical decision-making.   EKG Interpretation None      MDM   Final diagnoses:  Abscess    Cutaneous abscess, most likely sebaceous cyst infected. Doubt serious bacterial infection, systemic illness or impending vascular collapse.   Nursing Notes Reviewed/ Care Coordinated Applicable Imaging Reviewed Interpretation of Laboratory Data incorporated into ED treatment  The patient appears reasonably screened and/or stabilized for discharge and I doubt any other medical condition or other [facility] requiring further screening, evaluation, or treatment in the ED at this time prior to discharge.  Plan: Home Medications- OTC analgesia; Home Treatments- heat; return here if the recommended treatment, does not improve the symptoms; Recommended follow up- PCP prn     Daleen Bo, MD 01/18/16 [phone]

## 2016-01-18 NOTE — ED Notes (Signed)
Pt presents with an abscess between his shoulder blades x 3 days.  Redness noted and warm to touch.

## 2016-01-18 NOTE — Discharge Instructions (Signed)
Use moist heat on the sore area 3 or 4 times a day for 30 minutes. We were able to treat this without placing packing. You probably will need to use some ibuprofen or acetaminophen for pain. Return here, if needed, for problems.   Abscess An abscess is an infected area that contains a collection of pus and debris.It can occur in almost any part of the body. An abscess is also known as a furuncle or boil. CAUSES  An abscess occurs when tissue gets infected. This can occur from blockage of oil or sweat glands, infection of hair follicles, or a minor injury to the skin. As the body tries to fight the infection, pus collects in the area and creates pressure under the skin. This pressure causes pain. People with weakened immune systems have difficulty fighting infections and get certain abscesses more often.  SYMPTOMS Usually an abscess develops on the skin and becomes a painful mass that is red, warm, and tender. If the abscess forms under the skin, you may feel a moveable soft area under the skin. Some abscesses break open (rupture) on their own, but most will continue to get worse without care. The infection can spread deeper into the body and eventually into the bloodstream, causing you to feel ill.  DIAGNOSIS  Your caregiver will take your medical history and perform a physical exam. A sample of fluid may also be taken from the abscess to determine what is causing your infection. TREATMENT  Your caregiver may prescribe antibiotic medicines to fight the infection. However, taking antibiotics alone usually does not cure an abscess. Your caregiver may need to make a small cut (incision) in the abscess to drain the pus. In some cases, gauze is packed into the abscess to reduce pain and to continue draining the area. HOME CARE INSTRUCTIONS   Only take over-the-counter or prescription medicines for pain, discomfort, or fever as directed by your caregiver.  If you were prescribed antibiotics, take them  as directed. Finish them even if you start to feel better.  If gauze is used, follow your caregiver's directions for changing the gauze.  To avoid spreading the infection:  Keep your draining abscess covered with a bandage.  Wash your hands well.  Do not share personal care items, towels, or whirlpools with others.  Avoid skin contact with others.  Keep your skin and clothes clean around the abscess.  Keep all follow-up appointments as directed by your caregiver. SEEK MEDICAL CARE IF:   You have increased pain, swelling, redness, fluid drainage, or bleeding.  You have muscle aches, chills, or a general ill feeling.  You have a fever. MAKE SURE YOU:   Understand these instructions.  Will watch your condition.  Will get help right away if you are not doing well or get worse.   This information is not intended to replace advice given to you by your health care provider. Make sure you discuss any questions you have with your health care provider.   Document Released: 06/21/2005 Document Revised: 03/12/2012 Document Reviewed: 11/24/2011 Elsevier Interactive Patient Education 2016 [employer]  Incision and Drainage Incision and drainage is a procedure in which a sac-like structure (cystic structure) is opened and drained. The area to be drained usually contains material such as pus, fluid, or blood.  LET YOUR CAREGIVER KNOW ABOUT:   Allergies to medicine.  Medicines taken, including vitamins, herbs, eyedrops, over-the-counter medicines, and creams.  Use of steroids (by mouth or creams).  Previous problems with anesthetics  or numbing medicines.  History of bleeding problems or blood clots.  Previous surgery.  Other health problems, including diabetes and kidney problems.  Possibility of pregnancy, if this applies. RISKS AND COMPLICATIONS  Pain.  Bleeding.  Scarring.  Infection. BEFORE THE PROCEDURE  You may need to have an ultrasound or other imaging  tests to see how large or deep your cystic structure is. Blood tests may also be used to determine if you have an infection or how severe the infection is. You may need to have a tetanus shot. PROCEDURE  The affected area is cleaned with a cleaning fluid. The cyst area will then be numbed with a medicine (local anesthetic). A small incision will be made in the cystic structure. A syringe or catheter may be used to drain the contents of the cystic structure, or the contents may be squeezed out. The area will then be flushed with a cleansing solution. After cleansing the area, it is often gently packed with a gauze or another wound dressing. Once it is packed, it will be covered with gauze and tape or some other type of wound dressing. AFTER THE PROCEDURE   Often, you will be allowed to go home right after the procedure.  You may be given antibiotic medicine to prevent or heal an infection.  If the area was packed with gauze or some other wound dressing, you will likely need to come back in 1 to 2 days to get it removed.  The area should heal in about 14 days.   This information is not intended to replace advice given to you by your health care provider. Make sure you discuss any questions you have with your health care provider.   Document Released: 03/07/2001 Document Revised: 03/12/2012 Document Reviewed: 11/06/2011 Elsevier Interactive Patient Education [employer].

## 2016-03-14 ENCOUNTER — Ambulatory Visit (INDEPENDENT_AMBULATORY_CARE_PROVIDER_SITE_OTHER): Payer: Medicare Other | Admitting: Internal Medicine

## 2016-03-14 ENCOUNTER — Encounter: Payer: Self-pay | Admitting: Internal Medicine

## 2016-03-14 ENCOUNTER — Telehealth: Payer: Self-pay | Admitting: Internal Medicine

## 2016-03-14 VITALS — BP 156/92 | HR 71 | Temp 98.0°F | Resp 18 | Ht 70.0 in | Wt 196.0 lb

## 2016-03-14 DIAGNOSIS — E785 Hyperlipidemia, unspecified: Secondary | ICD-10-CM

## 2016-03-14 DIAGNOSIS — Z125 Encounter for screening for malignant neoplasm of prostate: Secondary | ICD-10-CM | POA: Insufficient documentation

## 2016-03-14 DIAGNOSIS — I10 Essential (primary) hypertension: Secondary | ICD-10-CM | POA: Diagnosis not present

## 2016-03-14 DIAGNOSIS — Z8673 Personal history of transient ischemic attack (TIA), and cerebral infarction without residual deficits: Secondary | ICD-10-CM

## 2016-03-14 DIAGNOSIS — Z833 Family history of diabetes mellitus: Secondary | ICD-10-CM

## 2016-03-14 MED ORDER — HYDROCHLOROTHIAZIDE 25 MG PO TABS: 25.0000 mg | ORAL_TABLET | Freq: Every day | ORAL | Status: DC

## 2016-03-14 NOTE — Assessment & Plan Note (Signed)
a1c

## 2016-03-14 NOTE — Assessment & Plan Note (Signed)
Check lipid panel  

## 2016-03-14 NOTE — Assessment & Plan Note (Signed)
Not controlled Restart hctz 25mg  daily Stressed low sodium diet Continue regular exercise Check basic labs F/u in 4 weeks

## 2016-03-14 NOTE — Progress Notes (Signed)
Subjective:    Patient ID: Todd Mendoza, male    DOB: 01-23-1951, 65 y.o.   MRN: [id number]  HPI He is here to establish with a new pcp.     Hypertension: He is not taking any medication daily- he was on medication a while ago, but was only given a months worth and he did not follow up.  He is not compliant with a low sodium diet.  He denies chest pain, palpitations, edema, shortness of breath and regular headaches. He is exercising regularly.  He does not monitor his blood pressure at home.    H/o CVA:  He had an MRI in 2014 and was told he had a stroke in the past.  He states he did not have symptoms, but he has had episodes of vertigo.  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  He denies vertigo now.  He is taking an asa 81 mg daily.  He was on lipitor, but does not feel that he needs it.   Medications and allergies reviewed with patient and updated if appropriate.  Patient Active Problem List   Diagnosis Date Noted  . Healthcare maintenance 09/10/2014  . H/O: CVA (cerebrovascular accident) 12/26/2012  . HTN (hypertension) 12/26/2012  . HLD (hyperlipidemia) 12/26/2012  . Allergic rhinitis 12/26/2012    Current Outpatient Prescriptions on File Prior to Visit  Medication Sig Dispense Refill  . aspirin EC 81 MG tablet Take 1 tablet (81 mg total) by mouth daily. 30 tablet 0   No current facility-administered medications on file prior to visit.    Past Medical History  Diagnosis Date  . Vertigo     History reviewed. No pertinent past surgical history.  Social History   Social History  . Marital Status: Single    Spouse Name: N/A  . Number of Children: N/A  . Years of Education: N/A   Social History Main Topics  . Smoking status: Former Smoker -- 0.20 packs/day    Types: Cigarettes  . Smokeless tobacco: None  . Alcohol Use: No  . Drug Use: No  . Sexual Activity: Not Asked   Other Topics Concern  . None   Social History Narrative    Family History  Problem  Relation Age of Onset  . Diabetes Father     Review of Systems  Constitutional: Negative for fever, chills, appetite change, fatigue and unexpected weight change.  Eyes: Negative for visual disturbance.  Respiratory: Negative for cough, shortness of breath and wheezing.   Cardiovascular: Negative for chest pain, palpitations and leg swelling.  Gastrointestinal: Negative for nausea, abdominal pain, diarrhea, constipation and blood in stool.       No gerd  Genitourinary: Negative for dysuria, hematuria and difficulty urinating.  Musculoskeletal: Positive for back pain (chornic - lower back). Negative for arthralgias and gait problem (balance is good).  Neurological: Positive for dizziness. Negative for light-headedness and headaches.  Psychiatric/Behavioral: Negative for dysphoric mood. The patient is not nervous/anxious.        Objective:   Filed Vitals:   03/14/16 0949  BP: 156/92  Pulse: 71  Temp: 98 F (36.7 C)  Resp: 18   Filed Weights   03/14/16 0949  Weight: 196 lb (88.905 kg)   Body mass index is 28.12 kg/(m^2).   Physical Exam Constitutional: He appears well-developed and well-nourished. No distress.  HENT:  Head: Normocephalic and atraumatic.  Right Ear: External ear normal.  Left Ear: External ear normal.  Mouth/Throat: Oropharynx is clear and moist.  Normal ear canals and TM b/l  Eyes: Conjunctivae normal.  Neck: Neck supple. No tracheal deviation present. No thyromegaly present.  No carotid bruit  Cardiovascular: Normal rate, regular rhythm, normal heart sounds and intact distal pulses.   No murmur heard. Pulmonary/Chest: Effort normal and breath sounds normal. No respiratory distress. He has no wheezes. He has no rales.  Abdominal: Soft. Bowel sounds are normal. He exhibits no distension. There is no tenderness.  Genitourinary: deferred  Musculoskeletal: He exhibits no edema.  Lymphadenopathy:    He has no cervical adenopathy.  Skin: Skin is warm and  dry. He is not diaphoretic.  Psychiatric: He has a normal mood and affect. His behavior is normal.       Assessment & Plan:   See Problem List for Assessment and Plan of chronic medical problems.  F/u in 4 weeks

## 2016-03-14 NOTE — Telephone Encounter (Signed)
Please resend script for hydrochlorothiazide.  Patient states pharmacy states they do not have it.

## 2016-03-14 NOTE — Assessment & Plan Note (Signed)
psa

## 2016-03-14 NOTE — Progress Notes (Signed)
Pre visit review using our clinic review tool, if applicable. No additional management support is needed unless otherwise documented below in the visit note. 

## 2016-03-14 NOTE — Assessment & Plan Note (Signed)
Discussed risk factors for cva Currently asymptomatic Check lipid panel Stressed low sodium diet, controlled BP Continue asa 81 mg Continue regular exercise

## 2016-03-14 NOTE — Patient Instructions (Addendum)
Test(s) ordered today. Your results will be released to [place] (or called to you) after review, usually within 72hours after test completion. If any changes need to be made, you will be notified at that same time.   Medications reviewed and updated.  Changes include starting hydrochlorothiazide for your blood pressure.   Your prescription(s) have been submitted to your pharmacy. Please take as directed and contact our office if you believe you are having problem(s) with the medication(s).  Please followup in 4 weeks   Hypertension Hypertension, commonly called high blood pressure, is when the force of blood pumping through your arteries is too strong. Your arteries are the blood vessels that carry blood from your heart throughout your body. A blood pressure reading consists of a higher number over a lower number, such as 110/72. The higher number (systolic) is the pressure inside your arteries when your heart pumps. The lower number (diastolic) is the pressure inside your arteries when your heart relaxes. Ideally you want your blood pressure below 120/80. Hypertension forces your heart to work harder to pump blood. Your arteries may become narrow or stiff. Having untreated or uncontrolled hypertension can cause heart attack, stroke, kidney disease, and other problems. RISK FACTORS Some risk factors for high blood pressure are controllable. Others are not.  Risk factors you cannot control include:   Race. You may be at higher risk if you are African American.  Age. Risk increases with age.  Gender. Men are at higher risk than women before age 63 years. After age 54, women are at higher risk than men. Risk factors you can control include:  Not getting enough exercise or physical activity.  Being overweight.  Getting too much fat, sugar, calories, or salt in your diet.  Drinking too much alcohol. SIGNS AND SYMPTOMS Hypertension does not usually cause signs or symptoms. Extremely high  blood pressure (hypertensive crisis) may cause headache, anxiety, shortness of breath, and nosebleed. DIAGNOSIS To check if you have hypertension, your health care provider will measure your blood pressure while you are seated, with your arm held at the level of your heart. It should be measured at least twice using the same arm. Certain conditions can cause a difference in blood pressure between your right and left arms. A blood pressure reading that is higher than normal on one occasion does not mean that you need treatment. If it is not clear whether you have high blood pressure, you may be asked to return on a different day to have your blood pressure checked again. Or, you may be asked to monitor your blood pressure at home for 1 or more weeks. TREATMENT Treating high blood pressure includes making lifestyle changes and possibly taking medicine. Living a healthy lifestyle can help lower high blood pressure. You may need to change some of your habits. Lifestyle changes may include:  Following the DASH diet. This diet is high in fruits, vegetables, and whole grains. It is low in salt, red meat, and added sugars.  Keep your sodium intake below 2,300 mg per day.  Getting at least 30-45 minutes of aerobic exercise at least 4 times per week.  Losing weight if necessary.  Not smoking.  Limiting alcoholic beverages.  Learning ways to reduce stress. Your health care provider may prescribe medicine if lifestyle changes are not enough to get your blood pressure under control, and if one of the following is true:  You are 41-69 years of age and your systolic blood pressure is above 140.  You are 16 years of age or older, and your systolic blood pressure is above 150.  Your diastolic blood pressure is above 90.  You have diabetes, and your systolic blood pressure is over [id number] or your diastolic blood pressure is over 90.  You have kidney disease and your blood pressure is above 140/90.  You  have heart disease and your blood pressure is above 140/90. Your personal target blood pressure may vary depending on your medical conditions, your age, and other factors. HOME CARE INSTRUCTIONS  Have your blood pressure rechecked as directed by your health care provider.   Take medicines only as directed by your health care provider. Follow the directions carefully. Blood pressure medicines must be taken as prescribed. The medicine does not work as well when you skip doses. Skipping doses also puts you at risk for problems.  Do not smoke.   Monitor your blood pressure at home as directed by your health care provider. SEEK MEDICAL CARE IF:   You think you are having a reaction to medicines taken.  You have recurrent headaches or feel dizzy.  You have swelling in your ankles.  You have trouble with your vision. SEEK IMMEDIATE MEDICAL CARE IF:  You develop a severe headache or confusion.  You have unusual weakness, numbness, or feel faint.  You have severe chest or abdominal pain.  You vomit repeatedly.  You have trouble breathing. MAKE SURE YOU:   Understand these instructions.  Will watch your condition.  Will get help right away if you are not doing well or get worse.   This information is not intended to replace advice given to you by your health care provider. Make sure you discuss any questions you have with your health care provider.   Document Released: 09/11/2005 Document Revised: 01/26/2015 Document Reviewed: 07/04/2013 Elsevier Interactive Patient Education [employer].

## 2016-03-20 ENCOUNTER — Other Ambulatory Visit (INDEPENDENT_AMBULATORY_CARE_PROVIDER_SITE_OTHER): Payer: Medicare Other

## 2016-03-20 DIAGNOSIS — Z833 Family history of diabetes mellitus: Secondary | ICD-10-CM | POA: Diagnosis not present

## 2016-03-20 DIAGNOSIS — I10 Essential (primary) hypertension: Secondary | ICD-10-CM

## 2016-03-20 DIAGNOSIS — Z8673 Personal history of transient ischemic attack (TIA), and cerebral infarction without residual deficits: Secondary | ICD-10-CM | POA: Diagnosis not present

## 2016-03-20 DIAGNOSIS — Z125 Encounter for screening for malignant neoplasm of prostate: Secondary | ICD-10-CM

## 2016-03-20 LAB — COMPREHENSIVE METABOLIC PANEL
ALBUMIN: 4.5 g/dL (ref 3.5–5.2)
ALK PHOS: 78 U/L (ref 39–117)
ALT: 29 U/L (ref 0–53)
AST: 48 U/L — ABNORMAL HIGH (ref 0–37)
BUN: 18 mg/dL (ref 6–23)
CALCIUM: 10.4 mg/dL (ref 8.4–10.5)
CO2: 31 mEq/L (ref 19–32)
Chloride: 99 mEq/L (ref 96–112)
Creatinine, Ser: 1.19 mg/dL (ref 0.40–1.50)
GFR: 78.66 mL/min (ref >60.00)
Glucose, Bld: 91 mg/dL (ref 70–99)
POTASSIUM: 4.7 meq/L (ref 3.5–5.1)
SODIUM: 137 meq/L (ref 135–145)
TOTAL PROTEIN: 8.6 g/dL — AB (ref 6.0–8.3)
Total Bilirubin: 0.6 mg/dL (ref 0.2–1.2)

## 2016-03-20 LAB — LIPID PANEL
CHOLESTEROL: 200 mg/dL (ref 0–200)
HDL: 35.5 mg/dL — AB (ref >39.00)
LDL Cholesterol: 145 mg/dL — ABNORMAL HIGH (ref 0–99)
NonHDL: 164.34
Total CHOL/HDL Ratio: 6
Triglycerides: 97 mg/dL (ref 0.0–149.0)
VLDL: 19.4 mg/dL (ref 0.0–40.0)

## 2016-03-20 LAB — CBC WITH DIFFERENTIAL/PLATELET
Basophils Absolute: 0 10*3/uL (ref 0.0–0.1)
Basophils Relative: 0.5 % (ref 0.0–3.0)
EOS ABS: 0.1 10*3/uL (ref 0.0–0.7)
EOS PCT: 1.9 % (ref 0.0–5.0)
HEMATOCRIT: 40.7 % (ref 39.0–52.0)
HEMOGLOBIN: 13.3 g/dL (ref 13.0–17.0)
LYMPHS PCT: 45.3 % (ref 12.0–46.0)
Lymphs Abs: 2.6 10*3/uL (ref 0.7–4.0)
MCHC: 32.7 g/dL (ref 30.0–36.0)
MCV: 85.7 fl (ref 78.0–100.0)
MONO ABS: 0.7 10*3/uL (ref 0.1–1.0)
Monocytes Relative: 11.6 % (ref 3.0–12.0)
Neutro Abs: 2.3 10*3/uL (ref 1.4–7.7)
Neutrophils Relative %: 40.7 % — ABNORMAL LOW (ref 43.0–77.0)
Platelets: 260 10*3/uL (ref 150.0–400.0)
RBC: 4.75 Mil/uL (ref 4.22–5.81)
RDW: 13.3 % (ref 11.5–15.5)
WBC: 5.8 10*3/uL (ref 4.0–10.5)

## 2016-03-20 LAB — TSH: TSH: 1.36 u[IU]/mL (ref 0.35–4.50)

## 2016-03-20 LAB — PSA, MEDICARE: PSA: 0.56 ng/ml (ref 0.10–4.00)

## 2016-03-20 LAB — HEMOGLOBIN A1C: HEMOGLOBIN A1C: 5.2 % (ref 4.6–6.5)

## 2016-04-11 ENCOUNTER — Encounter: Payer: Self-pay | Admitting: Internal Medicine

## 2016-04-11 ENCOUNTER — Ambulatory Visit (INDEPENDENT_AMBULATORY_CARE_PROVIDER_SITE_OTHER): Payer: Medicare Other | Admitting: Internal Medicine

## 2016-04-11 VITALS — BP 122/82 | HR 68 | Temp 97.6°F | Ht 70.0 in | Wt 190.0 lb

## 2016-04-11 DIAGNOSIS — E785 Hyperlipidemia, unspecified: Secondary | ICD-10-CM

## 2016-04-11 DIAGNOSIS — Z23 Encounter for immunization: Secondary | ICD-10-CM

## 2016-04-11 DIAGNOSIS — I10 Essential (primary) hypertension: Secondary | ICD-10-CM | POA: Diagnosis not present

## 2016-04-11 DIAGNOSIS — Z8673 Personal history of transient ischemic attack (TIA), and cerebral infarction without residual deficits: Secondary | ICD-10-CM | POA: Diagnosis not present

## 2016-04-11 MED ORDER — ATORVASTATIN CALCIUM 20 MG PO TABS: 20.0000 mg | ORAL_TABLET | Freq: Every day | ORAL | Status: DC

## 2016-04-11 NOTE — Progress Notes (Signed)
Pre visit review using our clinic review tool, if applicable. No additional management support is needed unless otherwise documented below in the visit note. 

## 2016-04-11 NOTE — Assessment & Plan Note (Signed)
Lipid - LDL high and h/o CVA, high ASCVD risk Start lipitor 20 mg daily Recheck cmp, lipid in 2 months

## 2016-04-11 NOTE — Progress Notes (Signed)
    Subjective:    Patient ID: Todd Mendoza, male    DOB: 07/23/1951, 65 y.o.   MRN: [id number]  HPI He is here for follow up.  Hypertension: He is taking his medication daily. He is compliant with a low sodium diet.  He denies chest pain, palpitations, edema, shortness of breath and regular headaches. He is exercising regularly - walking.  He does not monitor his blood pressure at home.    History of CVA: history of stroke on MRI, no symptoms.  He does experience have vertigo and stroke was in the cerebellum. He takes ASA 81 mg daily.  He was on lipitor, but did not want to take it.    He does have lightheadedness 1-2 times a week.  He is unsure if it occurs with changes in position or if there is a pattern to his symptoms.    Medications and allergies reviewed with patient and updated if appropriate.  Patient Active Problem List   Diagnosis Date Noted  . Family history of diabetes mellitus 03/14/2016  . Encounter for prostate cancer screening 03/14/2016  . H/O: CVA (cerebrovascular accident) 12/26/2012  . HTN (hypertension) 12/26/2012  . HLD (hyperlipidemia) 12/26/2012  . Allergic rhinitis 12/26/2012    Current Outpatient Prescriptions on File Prior to Visit  Medication Sig Dispense Refill  . aspirin EC 81 MG tablet Take 1 tablet (81 mg total) by mouth daily. 30 tablet 0  . hydrochlorothiazide (HYDRODIURIL) 25 MG tablet Take 1 tablet (25 mg total) by mouth daily. 30 tablet 11   No current facility-administered medications on file prior to visit.    Past Medical History  Diagnosis Date  . Vertigo     Past Surgical History  Procedure Laterality Date  . No past surgeries      Social History   Social History  . Marital Status: Single    Spouse Name: N/A  . Number of Children: N/A  . Years of Education: N/A   Social History Main Topics  . Smoking status: Former Smoker -- 0.20 packs/day    Types: Cigarettes  . Smokeless tobacco: Never Used  . Alcohol Use: No  .  Drug Use: No  . Sexual Activity: Not Asked   Other Topics Concern  . None   Social History Narrative   Married   Psychologist, counselling daily    Family History  Problem Relation Age of Onset  . Diabetes Father     Review of Systems  Constitutional: Negative for fever.  Respiratory: Negative for cough, shortness of breath and wheezing.   Cardiovascular: Negative for chest pain, palpitations and leg swelling.  Neurological: Positive for light-headedness. Negative for dizziness and headaches.       Objective:   Filed Vitals:   04/11/16 1052  BP: 122/82  Pulse: 68  Temp: 97.6 F (36.4 C)   Filed Weights   04/11/16 1052  Weight: 190 lb (86.183 kg)   Body mass index is 27.26 kg/(m^2).   Physical Exam Constitutional: Appears well-developed and well-nourished. No distress.  Neck: Neck supple. No tracheal deviation present. No thyromegaly present.  No carotid bruit. No cervical adenopathy.   Cardiovascular: Normal rate, regular rhythm and normal heart sounds.   No murmur heard.  No edema Pulmonary/Chest: Effort normal and breath sounds normal. No respiratory distress. No wheezes.       Assessment & Plan:   See Problem List for Assessment and Plan of chronic medical problems.

## 2016-04-11 NOTE — Assessment & Plan Note (Signed)
Continue ASA Start statin BP well controlled

## 2016-04-11 NOTE — Patient Instructions (Addendum)
Have fasting blood work done in two months to recheck your cholesterol.   Test(s) ordered today.   All other Health Maintenance issues reviewed.   All recommended immunizations and age-appropriate screenings are up-to-date or discussed.  Pneumonia vaccine administered today.   Medications reviewed and updated.  Changes include starting lipitor 20 mg daily.   Your prescription(s) have been submitted to your pharmacy. Please take as directed and contact our office if you believe you are having problem(s) with the medication(s).   Please followup in 6 months

## 2016-04-11 NOTE — Assessment & Plan Note (Signed)
BP well controlled Current regimen effective and well tolerated Continue current medications at current doses If lightheadedness occurs with changes in position may need to lower dose of medication He will start monitoring BP at home

## 2016-06-30 ENCOUNTER — Ambulatory Visit (INDEPENDENT_AMBULATORY_CARE_PROVIDER_SITE_OTHER): Payer: Medicare HMO | Admitting: Internal Medicine

## 2016-06-30 ENCOUNTER — Encounter: Payer: Self-pay | Admitting: Internal Medicine

## 2016-06-30 VITALS — BP 122/84 | HR 63 | Temp 97.8°F | Resp 16 | Ht 70.0 in | Wt 194.0 lb

## 2016-06-30 DIAGNOSIS — M5416 Radiculopathy, lumbar region: Secondary | ICD-10-CM | POA: Diagnosis not present

## 2016-06-30 MED ORDER — PREDNISONE 20 MG PO TABS: 40.0000 mg | ORAL_TABLET | Freq: Every day | ORAL | 0 refills | Status: DC

## 2016-06-30 MED ORDER — GABAPENTIN 100 MG PO CAPS: ORAL_CAPSULE | ORAL | 3 refills | Status: DC

## 2016-06-30 NOTE — Assessment & Plan Note (Signed)
New diagnosis, moderate pain Discussed cause of his pain and treatment options Referred for physical therapy Prednisone 40 mg daily for 5 days-instruction to take with food and does not take any NSAIDs while taking prednisone Start gabapentin at night-we'll titrate if tolerated Advised him to call or return if there is no improvement so we can revised medication, consider imaging and referral him to a specialist

## 2016-06-30 NOTE — Patient Instructions (Addendum)
Start two medications today.  Prednisone - you will take for 5 days.  Take it with food.  Do not take any advil or ibuprofen while taking the prednisone.    Start gabapentin at night and take as directed.    A referral was ordered for physical therapy.   If you pain is not getting better please call or follow up.      Lumbosacral Radiculopathy Lumbosacral radiculopathy is a condition that involves the spinal nerves and nerve roots in the low back and bottom of the spine. The condition develops when these nerves and nerve roots move out of place or become inflamed and cause symptoms. CAUSES This condition may be caused by:  Pressure from a disk that bulges out of place (herniated disk). A disk is a plate of cartilage that separates bones in the spine.  Disk degeneration.  A narrowing of the bones of the lower back (spinal stenosis).  A tumor.  An infection.  An injury that places sudden pressure on the disks that cushion the bones of your lower spine. RISK FACTORS This condition is more likely to develop in:  Males aged 30-50 years.  Females aged 61-60 years.  People who lift improperly.  People who are overweight or live a sedentary lifestyle.  People who smoke.  People who perform repetitive activities that strain the spine. SYMPTOMS Symptoms of this condition include:  Pain that goes down from the back into the legs (sciatica). This is the most common symptom. The pain may be worse with sitting, coughing, or sneezing.  Pain and numbness in the arms and legs.  Muscle weakness.  Tingling.  Loss of bladder control or bowel control. DIAGNOSIS This condition is diagnosed with a physical exam and medical history. If the pain is lasting, you may have tests, such as:  MRI scan.  X-ray.  CT scan.  Myelogram.  Nerve conduction study. TREATMENT This condition is often treated with:  Hot packs and ice applied to affected areas.  Stretches to improve  flexibility.  Exercises to strengthen back muscles.  Physical therapy.  Pain medicine.  A steroid injection in the spine. In some cases, no treatment is needed. If the condition is long-lasting (chronic), or if symptoms are severe, treatment may involve surgery or lifestyle changes, such as following a weight loss plan. HOME CARE INSTRUCTIONS Medicines  Take medicines only as directed by your health care provider.  Do not drive or operate heavy machinery while taking pain medicine. Injury Care  Apply a heat pack to the injured area as directed by your health care provider.  Apply ice to the affected area:  Put ice in a plastic bag.  Place a towel between your skin and the bag.  Leave the ice on for 20-30 minutes, every 2 hours while you are awake or as needed. Or, leave the ice on for as long as directed by your health care provider. Other Instructions  If you were shown how to do any exercises or stretches, do them as directed by your health care provider.  If your health care provider prescribed a diet or exercise program, follow it as directed.  Keep all follow-up visits as directed by your health care provider. This is important. SEEK MEDICAL CARE IF:  Your pain does not improve over time even when taking pain medicines. SEEK IMMEDIATE MEDICAL CARE IF:  Your develop severe pain.  Your pain suddenly gets worse.  You develop increasing weakness in your legs.  You lose the  ability to control your bladder or bowel.  You have difficulty walking or balancing.  You have a fever.   This information is not intended to replace advice given to you by your health care provider. Make sure you discuss any questions you have with your health care provider.   Document Released: 09/11/2005 Document Revised: 01/26/2015 Document Reviewed: 09/07/2014 Elsevier Interactive Patient Education [employer].

## 2016-06-30 NOTE — Progress Notes (Signed)
BP   Subjective:    Patient ID: Todd Mendoza, male    DOB: [birth year], 65 y.o.   MRN: [id number]  HPI He is here for an acute visit.  Lower back pain, leg pain: 2 half weeks ago he started to experience left lower back pain and pain radiating down to his left knee. He denies any obvious injuries or accidents. He denies prior episodes. The left leg is slightly tingling, but he denies any numbness or weakness in the leg. He denies any changes in his bowel or bladder habits. On occasion he will experience sharp transient pain in the right leg. Initially when his pain first started it was intermittent, but now is persistent.  He has been taking Advil and Aleve and there has been no relief of his pain. His pain is severe at times.   Medications and allergies reviewed with patient and updated if appropriate.  Patient Active Problem List   Diagnosis Date Noted  . Family history of diabetes mellitus 03/14/2016  . Encounter for prostate cancer screening 03/14/2016  . H/O: CVA (cerebrovascular accident) 12/26/2012  . HTN (hypertension) 12/26/2012  . HLD (hyperlipidemia) 12/26/2012  . Allergic rhinitis 12/26/2012    Current Outpatient Prescriptions on File Prior to Visit  Medication Sig Dispense Refill  . aspirin EC 81 MG tablet Take 1 tablet (81 mg total) by mouth daily. 30 tablet 0  . atorvastatin (LIPITOR) 20 MG tablet Take 1 tablet (20 mg total) by mouth daily. 90 tablet 3  . hydrochlorothiazide (HYDRODIURIL) 25 MG tablet Take 1 tablet (25 mg total) by mouth daily. 30 tablet 11   No current facility-administered medications on file prior to visit.     Past Medical History:  Diagnosis Date  . Vertigo     Past Surgical History:  Procedure Laterality Date  . NO PAST SURGERIES      Social History   Social History  . Marital status: Single    Spouse name: N/A  . Number of children: N/A  . Years of education: N/A   Social History Main Topics  . Smoking status: Former Smoker    Packs/day: 0.20    Types: Cigarettes  . Smokeless tobacco: Never Used  . Alcohol use No  . Drug use: No  . Sexual activity: Not on file   Other Topics Concern  . Not on file   Social History Narrative   Married   Walks daily    Family History  Problem Relation Age of Onset  . Diabetes Father     Review of Systems  Constitutional: Negative for chills and fever.  Gastrointestinal:       No bowel changes  Genitourinary:       No urinary changes or incontinence  Musculoskeletal: Positive for back pain and myalgias.  Skin: Negative for color change.  Neurological: Negative for weakness and numbness (Tingling left leg).       Objective:   Vitals:   06/30/16 1111  BP: 122/84  Pulse: 63  Resp: 16  Temp: 97.8 F (36.6 C)   Filed Weights   06/30/16 1111  Weight: 194 lb (88 kg)   Body mass index is 27.84 kg/m.   Physical Exam  Constitutional: He appears well-developed and well-nourished. No distress.  Musculoskeletal: He exhibits no edema.  No lumbar spine deformity or tenderness with palpation, no left lower back pain with palpation, slight lateral left hip discomfort with palpation  Neurological:  Normal sensation and strength bilateral lower extremities, gait was slightly  Skin: He is not diaphoretic.          Assessment & Plan:     See Problem List for Assessment and Plan of chronic medical problems.

## 2016-06-30 NOTE — Progress Notes (Signed)
Pre visit review using our clinic review tool, if applicable. No additional management support is needed unless otherwise documented below in the visit note. 

## 2016-07-10 ENCOUNTER — Telehealth: Payer: Self-pay | Admitting: Internal Medicine

## 2016-07-10 MED ORDER — HYDROCODONE-ACETAMINOPHEN 5-325 MG PO TABS: 1.0000 | ORAL_TABLET | Freq: Four times a day (QID) | ORAL | 0 refills | Status: DC | PRN

## 2016-07-10 MED ORDER — MELOXICAM 15 MG PO TABS: 15.0000 mg | ORAL_TABLET | Freq: Every day | ORAL | 0 refills | Status: DC

## 2016-07-10 NOTE — Telephone Encounter (Signed)
Spoke with pt to inform. Notified RX is ready for pick up at the office.

## 2016-07-10 NOTE — Telephone Encounter (Signed)
Patient has not heard about PT yet.  States he is in pain.  Would like to know if her could get something for pain.  Patient uses [facility] on [address]. Will transfer over to referrals to check on PT.

## 2016-07-10 NOTE — Telephone Encounter (Signed)
Is he taking the gabapentin at night?  Is he tolerating it?  We can increase the dose, which will help.  I sent meloxicam to his pharmacy - this is an anti-inflammatory - he should not take any advil/aleve while taking it.    rx for vicodin printed - given 5 days worth.  If his pain persists and he feels he needs more pain medication he needs to come in.  He should only take this for severe pain

## 2016-07-10 NOTE — Telephone Encounter (Signed)
Please advise 

## 2016-07-10 NOTE — Telephone Encounter (Signed)
Wife left msg on triage stating pt is still having the leg pain, and would like MD to rx something for sxs...Todd Mendoza

## 2016-07-13 ENCOUNTER — Ambulatory Visit: Payer: Medicare HMO | Admitting: Physical Therapy

## 2016-07-18 ENCOUNTER — Encounter: Payer: Medicare HMO | Admitting: Internal Medicine

## 2016-07-18 NOTE — Progress Notes (Signed)
    Subjective:    Patient ID: Todd Mendoza, male    DOB: [birth year], 65 y.o.   MRN: [id number]  HPI No show    Medications and allergies reviewed with patient and updated if appropriate.  Patient Active Problem List   Diagnosis Date Noted  . Lumbar radiculopathy 06/30/2016  . Family history of diabetes mellitus 03/14/2016  . Encounter for prostate cancer screening 03/14/2016  . H/O: CVA (cerebrovascular accident) 12/26/2012  . HTN (hypertension) 12/26/2012  . HLD (hyperlipidemia) 12/26/2012  . Allergic rhinitis 12/26/2012    Current Outpatient Prescriptions on File Prior to Visit  Medication Sig Dispense Refill  . aspirin EC 81 MG tablet Take 1 tablet (81 mg total) by mouth daily. 30 tablet 0  . atorvastatin (LIPITOR) 20 MG tablet Take 1 tablet (20 mg total) by mouth daily. 90 tablet 3  . gabapentin (NEURONTIN) 100 MG capsule Take two tabs at night for two nights, if well tolerated increase to 3 tabs at night 90 capsule 3  . hydrochlorothiazide (HYDRODIURIL) 25 MG tablet Take 1 tablet (25 mg total) by mouth daily. 30 tablet 11  . HYDROcodone-acetaminophen (NORCO/VICODIN) 5-325 MG tablet Take 1 tablet by mouth every 6 (six) hours as needed for moderate pain. 30 tablet 0  . meloxicam (MOBIC) 15 MG tablet Take 1 tablet (15 mg total) by mouth daily. 30 tablet 0   No current facility-administered medications on file prior to visit.     Past Medical History:  Diagnosis Date  . Vertigo     Past Surgical History:  Procedure Laterality Date  . NO PAST SURGERIES      Social History   Social History  . Marital status: Single    Spouse name: N/A  . Number of children: N/A  . Years of education: N/A   Social History Main Topics  . Smoking status: Former Smoker    Packs/day: 0.20    Types: Cigarettes  . Smokeless tobacco: Never Used  . Alcohol use No  . Drug use: No  . Sexual activity: Not on file   Other Topics Concern  . Not on file   Social History Narrative   Married   Walks daily    Family History  Problem Relation Age of Onset  . Diabetes Father     Review of Systems     Objective:  There were no vitals filed for this visit. There were no vitals filed for this visit. There is no height or weight on file to calculate BMI.   Physical Exam        Assessment & Plan:       This encounter was created in error - please disregard.

## 2016-07-28 ENCOUNTER — Ambulatory Visit: Payer: Medicare HMO | Admitting: Physical Therapy

## 2016-07-31 ENCOUNTER — Other Ambulatory Visit: Payer: Self-pay | Admitting: *Deleted

## 2016-07-31 MED ORDER — MELOXICAM 15 MG PO TABS: 15.0000 mg | ORAL_TABLET | Freq: Every day | ORAL | 2 refills | Status: DC

## 2016-10-12 ENCOUNTER — Ambulatory Visit: Payer: Medicare Other | Admitting: Internal Medicine

## 2016-11-02 NOTE — Progress Notes (Signed)
Subjective:    Patient ID: Todd Mendoza, male    DOB: 10/04/1950, 66 y.o.   MRN: [id number]  HPI He is here for a physical exam.   He goes to the gym 2-3 times a week.   He has gained weight and knows he needs to lose weight.  He feels mild SOB at times when he bends over, but not when he is working out.   He otherwise feels good and has no complaints.    Medications and allergies reviewed with patient and updated if appropriate.  Patient Active Problem List   Diagnosis Date Noted  . Lumbar radiculopathy 06/30/2016  . Family history of diabetes mellitus 03/14/2016  . Encounter for prostate cancer screening 03/14/2016  . H/O: CVA (cerebrovascular accident) 12/26/2012  . HTN (hypertension) 12/26/2012  . HLD (hyperlipidemia) 12/26/2012  . Allergic rhinitis 12/26/2012    Current Outpatient Prescriptions on File Prior to Visit  Medication Sig Dispense Refill  . aspirin EC 81 MG tablet Take 1 tablet (81 mg total) by mouth daily. 30 tablet 0  . atorvastatin (LIPITOR) 20 MG tablet Take 1 tablet (20 mg total) by mouth daily. 90 tablet 3  . gabapentin (NEURONTIN) 100 MG capsule Take two tabs at night for two nights, if well tolerated increase to 3 tabs at night 90 capsule 3  . hydrochlorothiazide (HYDRODIURIL) 25 MG tablet Take 1 tablet (25 mg total) by mouth daily. 30 tablet 11  . meloxicam (MOBIC) 15 MG tablet Take 1 tablet (15 mg total) by mouth daily. 30 tablet 2   No current facility-administered medications on file prior to visit.     Past Medical History:  Diagnosis Date  . Vertigo     Past Surgical History:  Procedure Laterality Date  . NO PAST SURGERIES      Social History   Social History  . Marital status: Single    Spouse name: N/A  . Number of children: N/A  . Years of education: N/A   Social History Main Topics  . Smoking status: Former Smoker    Packs/day: 0.20    Types: Cigarettes  . Smokeless tobacco: Never Used  . Alcohol use No  . Drug use: No    . Sexual activity: Not on file   Other Topics Concern  . Not on file   Social History Narrative   Married   Walks daily    Family History  Problem Relation Age of Onset  . Diabetes Father     Review of Systems  Constitutional: Negative for chills and fever.  Eyes: Negative for visual disturbance.  Respiratory: Positive for shortness of breath. Negative for cough and wheezing.   Cardiovascular: Negative for chest pain, palpitations and leg swelling.  Gastrointestinal: Negative for abdominal pain, blood in stool, constipation, diarrhea and nausea.       No gerd  Genitourinary: Negative for difficulty urinating, dysuria and hematuria.  Musculoskeletal: Positive for back pain (chronic, lower back). Negative for arthralgias.  Skin: Negative for color change and rash.  Neurological: Negative for dizziness, light-headedness and headaches.  Psychiatric/Behavioral: Negative for dysphoric mood. The patient is not nervous/anxious.        Objective:   Vitals:   11/03/16 0801  BP: 130/80  Pulse: 78  Resp: 16  Temp: 98 F (36.7 C)   Filed Weights   11/03/16 0801  Weight: 209 lb (94.8 kg)   Body mass index is 29.99 kg/m.  Wt Readings from Last 3 Encounters:  11/03/16 209  lb (94.8 kg)  06/30/16 194 lb (88 kg)  04/11/16 190 lb (86.2 kg)     Physical Exam Constitutional: He appears well-developed and well-nourished. No distress.  HENT:  Head: Normocephalic and atraumatic.  Right Ear: External ear normal.  Left Ear: External ear normal.  Mouth/Throat: Oropharynx is clear and moist.  Normal ear canals and TM b/l  Eyes: Conjunctivae and EOM are normal.  Neck: Neck supple. No tracheal deviation present. No thyromegaly present.  Left sided carotid bruit  Cardiovascular: Normal rate, regular rhythm, normal heart sounds and intact distal pulses.   No murmur heard. Pulmonary/Chest: Effort normal and breath sounds normal. No respiratory distress. He has no wheezes. He has no  rales.  Abdominal: Soft. Bowel sounds are normal. He exhibits no distension. There is no tenderness.  Genitourinary: deferred  Musculoskeletal: He exhibits no edema.  Lymphadenopathy:   He has no cervical adenopathy.  Skin: Skin is warm and dry. He is not diaphoretic.  Psychiatric: He has a normal mood and affect. His behavior is normal.        Assessment & Plan:   Physical exam: Screening blood work ordered Immunizations discussed td and shingles - deferred, flu deferred Colonoscopy - never had one - referred today Eye exams  Up to date  EKG  - today Exercise  - regular, but just started - stressed regular exercise Weight - discussed losing weight - he knows he needs to - will decreased carbs/sugars and poritons Skin  - no concerns Substance abuse  none  See Problem List for Assessment and Plan of chronic medical problems.   FU in 6 months

## 2016-11-02 NOTE — Patient Instructions (Addendum)
Test(s) ordered today. Your results will be released to [place] (or called to you) after review, usually within 72hours after test completion. If any changes need to be made, you will be notified at that same time.  All other Health Maintenance issues reviewed.   All recommended immunizations and age-appropriate screenings are up-to-date or discussed.  No immunizations administered today.   Medications reviewed and updated.   A prescription was given for viagra.  Your prescription(s) have been submitted to your pharmacy. Please take as directed and contact our office if you believe you are having problem(s) with the medication(s).  An EKG was done today.  An ultrasound of your neck arteries was ordered.   Please followup in 6 months  Health Maintenance, Male A healthy lifestyle and preventative care can promote health and wellness.  Maintain regular health, dental, and eye exams.  Eat a healthy diet. Foods like vegetables, fruits, whole grains, low-fat dairy products, and lean protein foods contain the nutrients you need and are low in calories. Decrease your intake of foods high in solid fats, added sugars, and salt. Get information about a proper diet from your health care provider, if necessary.  Regular physical exercise is one of the most important things you can do for your health. Most adults should get at least 150 minutes of moderate-intensity exercise (any activity that increases your heart rate and causes you to sweat) each week. In addition, most adults need muscle-strengthening exercises on 2 or more days a week.   Maintain a healthy weight. The body mass index (BMI) is a screening tool to identify possible weight problems. It provides an estimate of body fat based on height and weight. Your health care provider can find your BMI and can help you achieve or maintain a healthy weight. For males 20 years and older:  A BMI below 18.5 is considered underweight.  A BMI of 18.5  to 24.9 is normal.  A BMI of 25 to 29.9 is considered overweight.  A BMI of 30 and above is considered obese.  Maintain normal blood lipids and cholesterol by exercising and minimizing your intake of saturated fat. Eat a balanced diet with plenty of fruits and vegetables. Blood tests for lipids and cholesterol should begin at age 61 and be repeated every 5 years. If your lipid or cholesterol levels are high, you are over age 46, or you are at high risk for heart disease, you may need your cholesterol levels checked more frequently.Ongoing high lipid and cholesterol levels should be treated with medicines if diet and exercise are not working.  If you smoke, find out from your health care provider how to quit. If you do not use tobacco, do not start.  Lung cancer screening is recommended for adults aged 75-80 years who are at high risk for developing lung cancer because of a history of smoking. A yearly low-dose CT scan of the lungs is recommended for people who have at least a 30-pack-year history of smoking and are current smokers or have quit within the past 15 years. A pack year of smoking is smoking an average of 1 pack of cigarettes a day for 1 year (for example, a 30-pack-year history of smoking could mean smoking 1 pack a day for 30 years or 2 packs a day for 15 years). Yearly screening should continue until the smoker has stopped smoking for at least 15 years. Yearly screening should be stopped for people who develop a health problem that would prevent them from  having lung cancer treatment.  If you choose to drink alcohol, do not have more than 2 drinks per day. One drink is considered to be 12 oz (360 mL) of beer, 5 oz (150 mL) of wine, or 1.5 oz (45 mL) of liquor.  Avoid the use of street drugs. Do not share needles with anyone. Ask for help if you need support or instructions about stopping the use of drugs.  High blood pressure causes heart disease and increases the risk of stroke. High  blood pressure is more likely to develop in:  People who have blood pressure in the end of the normal range (100-139/85-89 mm Hg).  People who are overweight or obese.  People who are African American.  If you are 68-80 years of age, have your blood pressure checked every 3-5 years. If you are 9 years of age or older, have your blood pressure checked every year. You should have your blood pressure measured twice-once when you are at a hospital or clinic, and once when you are not at a hospital or clinic. Record the average of the two measurements. To check your blood pressure when you are not at a hospital or clinic, you can use:  An automated blood pressure machine at a pharmacy.  A home blood pressure monitor.  If you are 40-60 years old, ask your health care provider if you should take aspirin to prevent heart disease.  Diabetes screening involves taking a blood sample to check your fasting blood sugar level. This should be done once every 3 years after age 57 if you are at a normal weight and without risk factors for diabetes. Testing should be considered at a younger age or be carried out more frequently if you are overweight and have at least 1 risk factor for diabetes.  Colorectal cancer can be detected and often prevented. Most routine colorectal cancer screening begins at the age of 52 and continues through age 72. However, your health care provider may recommend screening at an earlier age if you have risk factors for colon cancer. On a yearly basis, your health care provider may provide home test kits to check for hidden blood in the stool. A small camera at the end of a tube may be used to directly examine the colon (sigmoidoscopy or colonoscopy) to detect the earliest forms of colorectal cancer. Talk to your health care provider about this at age 55 when routine screening begins. A direct exam of the colon should be repeated every 5-10 years through age 67, unless early forms of  precancerous polyps or small growths are found.  People who are at an increased risk for hepatitis B should be screened for this virus. You are considered at high risk for hepatitis B if:  You were born in a country where hepatitis B occurs often. Talk with your health care provider about which countries are considered high risk.  Your parents were born in a high-risk country and you have not received a shot to protect against hepatitis B (hepatitis B vaccine).  You have HIV or AIDS.  You use needles to inject street drugs.  You live with, or have sex with, someone who has hepatitis B.  You are a man who has sex with other men (MSM).  You get hemodialysis treatment.  You take certain medicines for conditions like cancer, organ transplantation, and autoimmune conditions.  Hepatitis C blood testing is recommended for all people born from [birth year] through 1965 and any individual with known  risk factors for hepatitis C.  Healthy men should no longer receive prostate-specific antigen (PSA) blood tests as part of routine cancer screening. Talk to your health care provider about prostate cancer screening.  Testicular cancer screening is not recommended for adolescents or adult males who have no symptoms. Screening includes self-exam, a health care provider exam, and other screening tests. Consult with your health care provider about any symptoms you have or any concerns you have about testicular cancer.  Practice safe sex. Use condoms and avoid high-risk sexual practices to reduce the spread of sexually transmitted infections (STIs).  You should be screened for STIs, including gonorrhea and chlamydia if:  You are sexually active and are younger than 24 years.  You are older than 24 years, and your health care provider tells you that you are at risk for this type of infection.  Your sexual activity has changed since you were last screened, and you are at an increased risk for chlamydia or  gonorrhea. Ask your health care provider if you are at risk.  If you are at risk of being infected with HIV, it is recommended that you take a prescription medicine daily to prevent HIV infection. This is called pre-exposure prophylaxis (PrEP). You are considered at risk if:  You are a man who has sex with other men (MSM).  You are a heterosexual man who is sexually active with multiple partners.  You take drugs by injection.  You are sexually active with a partner who has HIV.  Talk with your health care provider about whether you are at high risk of being infected with HIV. If you choose to begin PrEP, you should first be tested for HIV. You should then be tested every 3 months for as long as you are taking PrEP.  Use sunscreen. Apply sunscreen liberally and repeatedly throughout the day. You should seek shade when your shadow is shorter than you. Protect yourself by wearing long sleeves, pants, a wide-brimmed hat, and sunglasses year round whenever you are outdoors.  Tell your health care provider of new moles or changes in moles, especially if there is a change in shape or color. Also, tell your health care provider if a mole is larger than the size of a pencil eraser.  A one-time screening for abdominal aortic aneurysm (AAA) and surgical repair of large AAAs by ultrasound is recommended for men aged 53-75 years who are current or former smokers.  Stay current with your vaccines (immunizations). This information is not intended to replace advice given to you by your health care provider. Make sure you discuss any questions you have with your health care provider. Document Released: 03/09/2008 Document Revised: 10/02/2014 Document Reviewed: 06/15/2015 Elsevier Interactive Patient Education  2017 [employer].

## 2016-11-03 ENCOUNTER — Encounter: Payer: Self-pay | Admitting: Gastroenterology

## 2016-11-03 ENCOUNTER — Ambulatory Visit (INDEPENDENT_AMBULATORY_CARE_PROVIDER_SITE_OTHER): Payer: Medicare Other | Admitting: Internal Medicine

## 2016-11-03 ENCOUNTER — Encounter: Payer: Self-pay | Admitting: Internal Medicine

## 2016-11-03 ENCOUNTER — Other Ambulatory Visit (INDEPENDENT_AMBULATORY_CARE_PROVIDER_SITE_OTHER): Payer: Medicare Other

## 2016-11-03 VITALS — BP 130/80 | HR 78 | Temp 98.0°F | Resp 16 | Ht 70.0 in | Wt 209.0 lb

## 2016-11-03 DIAGNOSIS — Z1211 Encounter for screening for malignant neoplasm of colon: Secondary | ICD-10-CM

## 2016-11-03 DIAGNOSIS — Z0000 Encounter for general adult medical examination without abnormal findings: Secondary | ICD-10-CM

## 2016-11-03 DIAGNOSIS — Z8673 Personal history of transient ischemic attack (TIA), and cerebral infarction without residual deficits: Secondary | ICD-10-CM

## 2016-11-03 DIAGNOSIS — E7800 Pure hypercholesterolemia, unspecified: Secondary | ICD-10-CM

## 2016-11-03 DIAGNOSIS — Z125 Encounter for screening for malignant neoplasm of prostate: Secondary | ICD-10-CM

## 2016-11-03 DIAGNOSIS — I10 Essential (primary) hypertension: Secondary | ICD-10-CM | POA: Diagnosis not present

## 2016-11-03 DIAGNOSIS — N529 Male erectile dysfunction, unspecified: Secondary | ICD-10-CM | POA: Insufficient documentation

## 2016-11-03 DIAGNOSIS — M5416 Radiculopathy, lumbar region: Secondary | ICD-10-CM

## 2016-11-03 LAB — COMPREHENSIVE METABOLIC PANEL
ALBUMIN: 4.2 g/dL (ref 3.5–5.2)
ALK PHOS: 84 U/L (ref 39–117)
ALT: 36 U/L (ref 0–53)
AST: 62 U/L — ABNORMAL HIGH (ref 0–37)
BILIRUBIN TOTAL: 0.7 mg/dL (ref 0.2–1.2)
BUN: 14 mg/dL (ref 6–23)
CALCIUM: 9.5 mg/dL (ref 8.4–10.5)
CO2: 33 mEq/L — ABNORMAL HIGH (ref 19–32)
Chloride: 101 mEq/L (ref 96–112)
Creatinine, Ser: 0.96 mg/dL (ref 0.40–1.50)
GFR: 100.6 mL/min (ref >60.00)
GLUCOSE: 86 mg/dL (ref 70–99)
Potassium: 4.4 mEq/L (ref 3.5–5.1)
Sodium: 139 mEq/L (ref 135–145)
TOTAL PROTEIN: 7.6 g/dL (ref 6.0–8.3)

## 2016-11-03 LAB — CBC WITH DIFFERENTIAL/PLATELET
BASOS ABS: 0 10*3/uL (ref 0.0–0.1)
Basophils Relative: 0.3 % (ref 0.0–3.0)
Eosinophils Absolute: 0.3 10*3/uL (ref 0.0–0.7)
Eosinophils Relative: 4.9 % (ref 0.0–5.0)
HCT: 40.5 % (ref 39.0–52.0)
HEMOGLOBIN: 13 g/dL (ref 13.0–17.0)
Lymphocytes Relative: 47.6 % — ABNORMAL HIGH (ref 12.0–46.0)
Lymphs Abs: 2.9 10*3/uL (ref 0.7–4.0)
MCHC: 32.2 g/dL (ref 30.0–36.0)
MCV: 87.9 fl (ref 78.0–100.0)
MONO ABS: 0.8 10*3/uL (ref 0.1–1.0)
Monocytes Relative: 13.5 % — ABNORMAL HIGH (ref 3.0–12.0)
NEUTROS PCT: 33.7 % — AB (ref 43.0–77.0)
Neutro Abs: 2.1 10*3/uL (ref 1.4–7.7)
Platelets: 241 10*3/uL (ref 150.0–400.0)
RBC: 4.61 Mil/uL (ref 4.22–5.81)
RDW: 13.5 % (ref 11.5–15.5)
WBC: 6.1 10*3/uL (ref 4.0–10.5)

## 2016-11-03 LAB — LIPID PANEL
CHOLESTEROL: 110 mg/dL (ref 0–200)
HDL: 31.7 mg/dL — AB (ref >39.00)
LDL Cholesterol: 63 mg/dL (ref 0–99)
NONHDL: 77.84
TRIGLYCERIDES: 73 mg/dL (ref 0.0–149.0)
Total CHOL/HDL Ratio: 3
VLDL: 14.6 mg/dL (ref 0.0–40.0)

## 2016-11-03 LAB — TSH: TSH: 1.06 u[IU]/mL (ref 0.35–4.50)

## 2016-11-03 LAB — PSA, MEDICARE: PSA: 0.34 ng/mL (ref 0.10–4.00)

## 2016-11-03 MED ORDER — SILDENAFIL CITRATE 20 MG PO TABS: ORAL_TABLET | ORAL | 5 refills | Status: DC

## 2016-11-03 NOTE — Assessment & Plan Note (Signed)
Check lipid panel  Continue daily statin Regular exercise and healthy diet encouraged  

## 2016-11-03 NOTE — Assessment & Plan Note (Signed)
BP well controlled Current regimen effective and well tolerated Continue current medications at current doses cmp  

## 2016-11-03 NOTE — Assessment & Plan Note (Signed)
psa

## 2016-11-03 NOTE — Assessment & Plan Note (Signed)
Trial of viagra - rx sent to pharmacy

## 2016-11-03 NOTE — Progress Notes (Signed)
Pre visit review using our clinic review tool, if applicable. No additional management support is needed unless otherwise documented below in the visit note. 

## 2016-11-03 NOTE — Assessment & Plan Note (Signed)
BP well controlled Taking statin, ASA ? Left carotid bruit [place] of carotid arteries

## 2016-11-03 NOTE — Assessment & Plan Note (Signed)
Resolved Still has chronic lower back pain - tolerable

## 2016-11-17 ENCOUNTER — Inpatient Hospital Stay (HOSPITAL_COMMUNITY): Admission: RE | Admit: 2016-11-17 | Payer: Medicare Other | Source: Ambulatory Visit

## 2016-12-11 ENCOUNTER — Ambulatory Visit (AMBULATORY_SURGERY_CENTER): Payer: Self-pay

## 2016-12-11 ENCOUNTER — Encounter: Payer: Self-pay | Admitting: Gastroenterology

## 2016-12-11 VITALS — Ht 68.0 in | Wt 203.4 lb

## 2016-12-11 DIAGNOSIS — Z1211 Encounter for screening for malignant neoplasm of colon: Secondary | ICD-10-CM

## 2016-12-11 MED ORDER — SUPREP BOWEL PREP KIT 17.5-3.13-1.6 GM/177ML PO SOLN: 1.0000 | Freq: Once | ORAL | 0 refills | Status: AC

## 2016-12-18 ENCOUNTER — Encounter: Payer: Medicare Other | Admitting: Gastroenterology

## 2017-02-11 ENCOUNTER — Other Ambulatory Visit: Payer: Self-pay | Admitting: Internal Medicine

## 2017-02-12 ENCOUNTER — Other Ambulatory Visit: Payer: Self-pay | Admitting: Internal Medicine

## 2017-02-12 DIAGNOSIS — I10 Essential (primary) hypertension: Secondary | ICD-10-CM

## 2017-02-12 NOTE — Telephone Encounter (Signed)
Please advise, not on current med list.  

## 2017-02-21 ENCOUNTER — Other Ambulatory Visit: Payer: Self-pay | Admitting: Emergency Medicine

## 2017-02-21 DIAGNOSIS — I10 Essential (primary) hypertension: Secondary | ICD-10-CM

## 2017-02-21 MED ORDER — HYDROCHLOROTHIAZIDE 25 MG PO TABS: 25.0000 mg | ORAL_TABLET | Freq: Every day | ORAL | 1 refills | Status: DC

## 2017-03-13 NOTE — Progress Notes (Signed)
Subjective:   Todd Mendoza is a 66 y.o. male who presents for an Initial Medicare Annual Wellness Visit.  Review of Systems  No ROS.  Medicare Wellness Visit. Additional risk factors are reflected in the social history.  Cardiac Risk Factors include: advanced age (>19men, >65 women);dyslipidemia;hypertension;male gender Sleep patterns: feels rested on waking, gets up 1 times nightly to void and sleeps 7-8 hours nightly.    Home Safety/Smoke Alarms: Feels safe in home. Smoke alarms in place.  Living environment; residence and Firearm Safety: 1-story house/ trailer, no firearms. Lives with wife, no needs for DME Seat Belt Safety/Bike Helmet: Wears seat belt.   Counseling:  Eye Exam- Resources given Dental- Resources given  Male:   CCS- N/D, education provided about colonoscopy and Cologuard, patient declines referral at this time.   PSA-  Lab Results  Component Value Date   PSA 0.34 11/03/2016   PSA 0.56 03/20/2016      Objective:    Today's Vitals   03/15/17 1622  BP: 128/68  Pulse: 65  Resp: 20  SpO2: 98%  Weight: 191 lb (86.6 kg)  Height: 5\' 8"  (1.727 m)   Body mass index is 29.04 kg/m.  Current Medications (verified) Outpatient Encounter Prescriptions as of 03/15/2017  Medication Sig  . aspirin EC 81 MG tablet Take 1 tablet (81 mg total) by mouth daily.  Marland Kitchen atorvastatin (LIPITOR) 20 MG tablet Take 1 tablet (20 mg total) by mouth daily.  . hydrochlorothiazide (HYDRODIURIL) 25 MG tablet Take 1 tablet (25 mg total) by mouth daily.  . [DISCONTINUED] meloxicam (MOBIC) 15 MG tablet TAKE 1 TABLET BY MOUTH EVERY DAY (Patient not taking: Reported on 03/15/2017)  . [DISCONTINUED] sildenafil (REVATIO) 20 MG tablet Take 3-5 tabs daily as needed (Patient not taking: Reported on 03/15/2017)   No facility-administered encounter medications on file as of 03/15/2017.     Allergies (verified) Patient has no known allergies.   History: Past Medical History:  Diagnosis Date    . Hyperlipidemia   . Stroke ([place])   . Vertigo    Past Surgical History:  Procedure Laterality Date  . NO PAST SURGERIES     Family History  Problem Relation Age of Onset  . Diabetes Father   . Colon cancer Neg Hx    Social History   Occupational History  . Not on file.   Social History Main Topics  . Smoking status: Former Smoker    Packs/day: 0.20    Types: Cigarettes  . Smokeless tobacco: Never Used  . Alcohol use No  . Drug use: No  . Sexual activity: Not on file   Tobacco Counseling Counseling given: Not Answered   Activities of Daily Living In your present state of health, do you have any difficulty performing the following activities: 03/15/2017  Hearing? N  Vision? N  Difficulty concentrating or making decisions? N  Walking or climbing stairs? N  Dressing or bathing? N  Doing errands, shopping? N  Preparing Food and eating ? N  Using the Toilet? N  In the past six months, have you accidently leaked urine? N  Do you have problems with loss of bowel control? N  Managing your Medications? N  Managing your Finances? N  Housekeeping or managing your Housekeeping? N  Some recent data might be hidden    Immunizations and Health Maintenance Immunization History  Administered Date(s) Administered  . Pneumococcal Conjugate-13 04/11/2016   Health Maintenance Due  Topic Date Due  . COLONOSCOPY  03/28/2000  Patient Care Team: Binnie Rail, MD as PCP - General (Internal Medicine)  Indicate any recent Medical Services you may have received from other than [facility] providers in the past year (date may be approximate).    Assessment:   This is a routine wellness examination for Todd Mendoza. Physical assessment deferred to PCP.  Hearing/Vision screen Hearing Screening Comments: Able to hear conversational tones w/o difficulty. No issues reported.    Dietary issues and exercise activities discussed: Current Exercise Habits: Home exercise routine, Type of  exercise: walking, Time (Minutes): 40, Frequency (Times/Week): 3, Weekly Exercise (Minutes/Week): 120, Intensity: Mild, Exercise limited by: None identified  Diet (meal preparation, eat out, water intake, caffeinated beverages, dairy products, fruits and vegetables): in general, a "healthy" diet  , well balanced, low fat/ cholesterol, low salt  Reviewed heart healthy diet and weight loss tips, encouraged patient to increase daily water intake. Diet education was attached to patient's AVS.  Goals    . Weight (lb) < 200 lb (90.7 kg)          Continue to exercise and eat as healthy as possible      Depression Screen PHQ 2/9 Scores 03/15/2017 03/14/2016 09/10/2014 12/26/2012  PHQ - 2 Score 0 0 0 0    Fall Risk Fall Risk  03/15/2017 03/14/2016 09/10/2014 12/26/2012  Falls in the past year? No No No Yes  Number falls in past yr: - - - 1  Risk for fall due to : - - - History of fall(s);Impaired balance/gait    Cognitive Function:       Ad8 score reviewed for issues:  Issues making decisions: no  Less interest in hobbies / activities: no  Repeats questions, stories (family complaining): no  Trouble using ordinary gadgets (microwave, computer, phone):no  Forgets the month or year: no  Mismanaging finances: no  Remembering appts: no  Daily problems with thinking and/or memory: no Ad8 score is= 0  Screening Tests Health Maintenance  Topic Date Due  . COLONOSCOPY  03/28/2000  . TETANUS/TDAP  03/15/2018 (Originally 03/28/1969)  . PNA vac Low Risk Adult (2 of 2 - PPSV23) 04/11/2017  . INFLUENZA VACCINE  04/25/2017  . Hepatitis C Screening  Completed        Plan:    Continue doing brain stimulating activities (puzzles, reading, adult coloring books, staying active) to keep memory sharp.   Continue to eat heart healthy diet (full of fruits, vegetables, whole grains, lean protein, water--limit salt, fat, and sugar intake) and increase physical activity as tolerated.  I have  personally reviewed and noted the following in the patient's chart:   . Medical and social history . Use of alcohol, tobacco or illicit drugs  . Current medications and supplements . Functional ability and status . Nutritional status . Physical activity . Advanced directives . List of other physicians . Vitals . Screenings to include cognitive, depression, and falls . Referrals and appointments  In addition, I have reviewed and discussed with patient certain preventive protocols, quality metrics, and best practice recommendations. A written personalized care plan for preventive services as well as general preventive health recommendations were provided to patient.     Michiel Cowboy, RN   03/15/2017

## 2017-03-13 NOTE — Progress Notes (Signed)
Pre visit review using our clinic review tool, if applicable. No additional management support is needed unless otherwise documented below in the visit note. 

## 2017-03-15 ENCOUNTER — Ambulatory Visit (INDEPENDENT_AMBULATORY_CARE_PROVIDER_SITE_OTHER): Payer: Medicare Other | Admitting: *Deleted

## 2017-03-15 VITALS — BP 128/68 | HR 65 | Resp 20 | Ht 68.0 in | Wt 191.0 lb

## 2017-03-15 DIAGNOSIS — Z0000 Encounter for general adult medical examination without abnormal findings: Secondary | ICD-10-CM

## 2017-03-15 NOTE — Patient Instructions (Addendum)
Deliah Goody MD  (7)  [facility] [phone]  Continue doing brain stimulating activities (puzzles, reading, adult coloring books, staying active) to keep memory sharp.   Continue to eat heart healthy diet (full of fruits, vegetables, whole grains, lean protein, water--limit salt, fat, and sugar intake) and increase physical activity as tolerated.   Mr. Todd Mendoza , Thank you for taking time to come for your Medicare Wellness Visit. I appreciate your ongoing commitment to your health goals. Please review the following plan we discussed and let me know if I can assist you in the future.   These are the goals we discussed: Goals    . Weight (lb) < 200 lb (90.7 kg)          Continue to exercise and eat as healthy as possible       This is a list of the screening recommended for you and due dates:  Health Maintenance  Topic Date Due  . Tetanus Vaccine  03/28/1969  . Colon Cancer Screening  03/28/2000  . Pneumonia vaccines (2 of 2 - PPSV23) 04/11/2017  . Flu Shot  04/25/2017  .  Hepatitis C: One time screening is recommended by Center for Disease Control  (CDC) for  adults born from [birth year] through 1966.   Completed    DASH Eating Plan DASH stands for "Dietary Approaches to Stop Hypertension." The DASH eating plan is a healthy eating plan that has been shown to reduce high blood pressure (hypertension). It may also reduce your risk for type 2 diabetes, heart disease, and stroke. The DASH eating plan may also help with weight loss. What are tips for following this plan? General guidelines  Avoid eating more than 2,300 mg (milligrams) of salt (sodium) a day. If you have hypertension, you may need to reduce your sodium intake to 1,500 mg a day.  Limit alcohol intake to no more than 1 drink a day for nonpregnant women and 2 drinks a day for men. One drink equals 12 oz of beer, 5 oz of Miche Loughridge, or 1 oz of hard liquor.  Work with your health care provider to maintain a  healthy body weight or to lose weight. Ask what an ideal weight is for you.  Get at least 30 minutes of exercise that causes your heart to beat faster (aerobic exercise) most days of the week. Activities may include walking, swimming, or biking.  Work with your health care provider or diet and nutrition specialist (dietitian) to adjust your eating plan to your individual calorie needs. Reading food labels  Check food labels for the amount of sodium per serving. Choose foods with less than 5 percent of the Daily Value of sodium. Generally, foods with less than 300 mg of sodium per serving fit into this eating plan.  To find whole grains, look for the word "whole" as the first word in the ingredient list. Shopping  Buy products labeled as "low-sodium" or "no salt added."  Buy fresh foods. Avoid canned foods and premade or frozen meals. Cooking  Avoid adding salt when cooking. Use salt-free seasonings or herbs instead of table salt or sea salt. Check with your health care provider or pharmacist before using salt substitutes.  Do not fry foods. Cook foods using healthy methods such as baking, boiling, grilling, and broiling instead.  Cook with heart-healthy oils, such as olive, canola, soybean, or sunflower oil. Meal planning   Eat a balanced diet that includes: ? 5 or more servings  of fruits and vegetables each day. At each meal, try to fill half of your plate with fruits and vegetables. ? Up to 6-8 servings of whole grains each day. ? Less than 6 oz of lean meat, poultry, or fish each day. A 3-oz serving of meat is about the same size as a deck of cards. One egg equals 1 oz. ? 2 servings of low-fat dairy each day. ? A serving of nuts, seeds, or beans 5 times each week. ? Heart-healthy fats. Healthy fats called Omega-3 fatty acids are found in foods such as flaxseeds and coldwater fish, like sardines, salmon, and mackerel.  Limit how much you eat of the following: ? Canned or  prepackaged foods. ? Food that is high in trans fat, such as fried foods. ? Food that is high in saturated fat, such as fatty meat. ? Sweets, desserts, sugary drinks, and other foods with added sugar. ? Full-fat dairy products.  Do not salt foods before eating.  Try to eat at least 2 vegetarian meals each week.  Eat more home-cooked food and less restaurant, buffet, and fast food.  When eating at a restaurant, ask that your food be prepared with less salt or no salt, if possible. What foods are recommended? The items listed may not be a complete list. Talk with your dietitian about what dietary choices are best for you. Grains Whole-grain or whole-wheat bread. Whole-grain or whole-wheat pasta. Brown rice. [place]. Bulgur. Whole-grain and low-sodium cereals. Pita bread. Low-fat, low-sodium crackers. Whole-wheat flour tortillas. Vegetables Fresh or frozen vegetables (raw, steamed, roasted, or grilled). Low-sodium or reduced-sodium tomato and vegetable juice. Low-sodium or reduced-sodium tomato sauce and tomato paste. Low-sodium or reduced-sodium canned vegetables. Fruits All fresh, dried, or frozen fruit. Canned fruit in natural juice (without added sugar). Meat and other protein foods Skinless chicken or [place]. Ground chicken or [place]. Pork with fat trimmed off. Fish and seafood. Egg whites. Dried beans, peas, or lentils. Unsalted nuts, nut butters, and seeds. Unsalted canned beans. Lean cuts of beef with fat trimmed off. Low-sodium, lean deli meat. Dairy Low-fat (1%) or fat-free (skim) milk. Fat-free, low-fat, or reduced-fat cheeses. Nonfat, low-sodium ricotta or cottage cheese. Low-fat or nonfat yogurt. Low-fat, low-sodium cheese. Fats and oils Soft margarine without trans fats. Vegetable oil. Low-fat, reduced-fat, or light mayonnaise and salad dressings (reduced-sodium). Canola, safflower, olive, soybean, and sunflower oils. Avocado. Seasoning and other foods Herbs. Spices.  Seasoning mixes without salt. Unsalted popcorn and pretzels. Fat-free sweets. What foods are not recommended? The items listed may not be a complete list. Talk with your dietitian about what dietary choices are best for you. Grains Baked goods made with fat, such as croissants, muffins, or some breads. Dry pasta or rice meal packs. Vegetables Creamed or fried vegetables. Vegetables in a cheese sauce. Regular canned vegetables (not low-sodium or reduced-sodium). Regular canned tomato sauce and paste (not low-sodium or reduced-sodium). Regular tomato and vegetable juice (not low-sodium or reduced-sodium). Angie Fava. Olives. Fruits Canned fruit in a light or heavy syrup. Fried fruit. Fruit in cream or butter sauce. Meat and other protein foods Fatty cuts of meat. Ribs. Fried meat. Berniece Salines. Sausage. Bologna and other processed lunch meats. Salami. Fatback. Hotdogs. Bratwurst. Salted nuts and seeds. Canned beans with added salt. Canned or smoked fish. Whole eggs or egg yolks. Chicken or [place] with skin. Dairy Whole or 2% milk, cream, and half-and-half. Whole or full-fat cream cheese. Whole-fat or sweetened yogurt. Full-fat cheese. Nondairy creamers. Whipped toppings. Processed cheese and cheese spreads.  Fats and oils Butter. Stick margarine. Lard. Shortening. Ghee. Bacon fat. Tropical oils, such as coconut, palm kernel, or palm oil. Seasoning and other foods Salted popcorn and pretzels. Onion salt, garlic salt, seasoned salt, table salt, and sea salt. Worcestershire sauce. Tartar sauce. Barbecue sauce. Teriyaki sauce. Soy sauce, including reduced-sodium. Steak sauce. Canned and packaged gravies. Fish sauce. Oyster sauce. Cocktail sauce. Horseradish that you find on the shelf. Ketchup. Mustard. Meat flavorings and tenderizers. Bouillon cubes. Hot sauce and Tabasco sauce. Premade or packaged marinades. Premade or packaged taco seasonings. Relishes. Regular salad dressings. Where to find more  information:  National Heart, Lung, and [place]: https://wilson-eaton.com/  American Heart Association: www.heart.org Summary  The DASH eating plan is a healthy eating plan that has been shown to reduce high blood pressure (hypertension). It may also reduce your risk for type 2 diabetes, heart disease, and stroke.  With the DASH eating plan, you should limit salt (sodium) intake to 2,300 mg a day. If you have hypertension, you may need to reduce your sodium intake to 1,500 mg a day.  When on the DASH eating plan, aim to eat more fresh fruits and vegetables, whole grains, lean proteins, low-fat dairy, and heart-healthy fats.  Work with your health care provider or diet and nutrition specialist (dietitian) to adjust your eating plan to your individual calorie needs. This information is not intended to replace advice given to you by your health care provider. Make sure you discuss any questions you have with your health care provider. Document Released: 08/31/2011 Document Revised: 09/04/2016 Document Reviewed: 09/04/2016 Elsevier Interactive Patient Education  2017 [place] for Diabetes Mellitus, Adult Carbohydrate counting is a method for keeping track of how many carbohydrates you eat. Eating carbohydrates naturally increases the amount of sugar (glucose) in the blood. Counting how many carbohydrates you eat helps keep your blood glucose within normal limits, which helps you manage your diabetes (diabetes mellitus). It is important to know how many carbohydrates you can safely have in each meal. This is different for every person. A diet and nutrition specialist (registered dietitian) can help you make a meal plan and calculate how many carbohydrates you should have at each meal and snack. Carbohydrates are found in the following foods:  Grains, such as breads and cereals.  Dried beans and soy products.  Starchy vegetables, such as potatoes, peas, and  corn.  Fruit and fruit juices.  Milk and yogurt.  Sweets and snack foods, such as cake, cookies, candy, chips, and soft drinks.  How do I count carbohydrates? There are two ways to count carbohydrates in food. You can use either of the methods or a combination of both. Reading "Nutrition Facts" on packaged food The "Nutrition Facts" list is included on the labels of almost all packaged foods and beverages in the [place] It includes:  The serving size.  Information about nutrients in each serving, including the grams (g) of carbohydrate per serving.  To use the "Nutrition Facts":  Decide how many servings you will have.  Multiply the number of servings by the number of carbohydrates per serving.  The resulting number is the total amount of carbohydrates that you will be having.  Learning standard serving sizes of other foods When you eat foods containing carbohydrates that are not packaged or do not include "Nutrition Facts" on the label, you need to measure the servings in order to count the amount of carbohydrates:  Measure the foods that you will eat with a  food scale or measuring cup, if needed.  Decide how many standard-size servings you will eat.  Multiply the number of servings by 15. Most carbohydrate-rich foods have about 15 g of carbohydrates per serving. ? For example, if you eat 8 oz (170 g) of strawberries, you will have eaten 2 servings and 30 g of carbohydrates (2 servings x 15 g = 30 g).  For foods that have more than one food mixed, such as soups and casseroles, you must count the carbohydrates in each food that is included.  The following list contains standard serving sizes of common carbohydrate-rich foods. Each of these servings has about 15 g of carbohydrates:   hamburger bun or  English muffin.   oz (15 mL) syrup.   oz (14 g) jelly.  1 slice of bread.  1 six-inch tortilla.  3 oz (85 g) cooked rice or pasta.  4 oz (113 g) cooked dried  beans.  4 oz (113 g) starchy vegetable, such as peas, corn, or potatoes.  4 oz (113 g) hot cereal.  4 oz (113 g) mashed potatoes or  of a large baked potato.  4 oz (113 g) canned or frozen fruit.  4 oz (120 mL) fruit juice.  4-6 crackers.  6 chicken nuggets.  6 oz (170 g) unsweetened dry cereal.  6 oz (170 g) plain fat-free yogurt or yogurt sweetened with artificial sweeteners.  8 oz (240 mL) milk.  8 oz (170 g) fresh fruit or one small piece of fruit.  24 oz (680 g) popped popcorn.  Example of carbohydrate counting Sample meal  3 oz (85 g) chicken breast.  6 oz (170 g) brown rice.  4 oz (113 g) corn.  8 oz (240 mL) milk.  8 oz (170 g) strawberries with sugar-free whipped topping. Carbohydrate calculation 1. Identify the foods that contain carbohydrates: ? Rice. ? Corn. ? Milk. ? Strawberries. 2. Calculate how many servings you have of each food: ? 2 servings rice. ? 1 serving corn. ? 1 serving milk. ? 1 serving strawberries. 3. Multiply each number of servings by 15 g: ? 2 servings rice x 15 g = 30 g. ? 1 serving corn x 15 g = 15 g. ? 1 serving milk x 15 g = 15 g. ? 1 serving strawberries x 15 g = 15 g. 4. Add together all of the amounts to find the total grams of carbohydrates eaten: ? 30 g + 15 g + 15 g + 15 g = 75 g of carbohydrates total. This information is not intended to replace advice given to you by your health care provider. Make sure you discuss any questions you have with your health care provider. Document Released: 09/11/2005 Document Revised: 03/31/2016 Document Reviewed: 02/23/2016 Elsevier Interactive Patient Education  [employer].

## 2017-03-20 NOTE — Progress Notes (Signed)
Medical screening examination/treatment/procedure(s) were performed by he Welness Coach, RN. As primary care provider I was immediately available for consulation/collaboration. I agree with above documentation. Sherrick Araki, AGNP-C 

## 2017-04-02 ENCOUNTER — Encounter (HOSPITAL_COMMUNITY): Payer: Self-pay | Admitting: Emergency Medicine

## 2017-04-02 ENCOUNTER — Emergency Department (HOSPITAL_COMMUNITY)
Admission: EM | Admit: 2017-04-02 | Discharge: 2017-04-02 | Disposition: A | Discharge status: Home / Self Care | Payer: Medicare Other | Attending: Emergency Medicine | Admitting: Emergency Medicine

## 2017-04-02 DIAGNOSIS — Z87891 Personal history of nicotine dependence: Secondary | ICD-10-CM | POA: Diagnosis not present

## 2017-04-02 DIAGNOSIS — X58XXXA Exposure to other specified factors, initial encounter: Secondary | ICD-10-CM | POA: Insufficient documentation

## 2017-04-02 DIAGNOSIS — Y990 Civilian activity done for income or pay: Secondary | ICD-10-CM | POA: Insufficient documentation

## 2017-04-02 DIAGNOSIS — Z7982 Long term (current) use of aspirin: Secondary | ICD-10-CM | POA: Diagnosis not present

## 2017-04-02 DIAGNOSIS — Y93E5 Activity, floor mopping and cleaning: Secondary | ICD-10-CM | POA: Diagnosis not present

## 2017-04-02 DIAGNOSIS — Z77098 Contact with and (suspected) exposure to other hazardous, chiefly nonmedicinal, chemicals: Secondary | ICD-10-CM | POA: Insufficient documentation

## 2017-04-02 DIAGNOSIS — S99921A Unspecified injury of right foot, initial encounter: Secondary | ICD-10-CM | POA: Diagnosis present

## 2017-04-02 DIAGNOSIS — I10 Essential (primary) hypertension: Secondary | ICD-10-CM | POA: Insufficient documentation

## 2017-04-02 DIAGNOSIS — Z79899 Other long term (current) drug therapy: Secondary | ICD-10-CM | POA: Insufficient documentation

## 2017-04-02 DIAGNOSIS — S91301A Unspecified open wound, right foot, initial encounter: Secondary | ICD-10-CM | POA: Diagnosis not present

## 2017-04-02 DIAGNOSIS — Z23 Encounter for immunization: Secondary | ICD-10-CM | POA: Diagnosis not present

## 2017-04-02 DIAGNOSIS — Y929 Unspecified place or not applicable: Secondary | ICD-10-CM | POA: Insufficient documentation

## 2017-04-02 MED ORDER — TETANUS-DIPHTH-ACELL PERTUSSIS 5-2.5-18.5 LF-MCG/0.5 IM SUSP
0.5000 mL | Freq: Once | INTRAMUSCULAR | Status: AC
  Administered 2017-04-02: 0.5 mL via INTRAMUSCULAR
  Filled 2017-04-02: qty 0.5

## 2017-04-02 MED ORDER — CIPROFLOXACIN HCL 750 MG PO TABS: 750.0000 mg | ORAL_TABLET | Freq: Two times a day (BID) | ORAL | 0 refills | Status: DC

## 2017-04-02 NOTE — ED Triage Notes (Signed)
Patient states that Friday while working he was some debris fall in shoe without him knowing, then when he took sock off later, skin come off with it.  Patient has been cleaning area on top of right foot but this morning was bleeding so patient wanted foot checked out.

## 2017-04-02 NOTE — ED Provider Notes (Signed)
[facility] DEPT Provider Note   CSN: [id number] Arrival date & time: 04/02/17  0741     History   Chief Complaint Chief Complaint  Patient presents with  . Wound Infection    HPI Todd Mendoza is a 66 y.o. male presenting with wound of right foot 4 days.  Patient states he was at work when some of the "soap" he was using for cleaning fell on his foot and burned through his sneakers. He does not know the name of the chemicals he was using. Initially, he reports his foot and ankle were swollen. He reports improved swelling today, but continued redness surrounding the lesion. Additionally, patient reports increased bleeding from the area. He has been applying over-the-counter burn cream and peroxide. Denies history of poor healing or diabetes. He denies fever or chills. He has full range of motion of his ankle and toes without pain. He reports the lesion hurts after standing for extended periods of time, but when resting it, does not hurt. He reports his last tetanus shot was 10 or 11 years ago.  HPI  Past Medical History:  Diagnosis Date  . Hyperlipidemia   . Stroke ([place])   . Vertigo     Patient Active Problem List   Diagnosis Date Noted  . ED (erectile dysfunction) 11/03/2016  . Lumbar radiculopathy 06/30/2016  . Family history of diabetes mellitus 03/14/2016  . Encounter for prostate cancer screening 03/14/2016  . H/O: CVA (cerebrovascular accident) 12/26/2012  . HTN (hypertension) 12/26/2012  . HLD (hyperlipidemia) 12/26/2012  . Allergic rhinitis 12/26/2012    Past Surgical History:  Procedure Laterality Date  . NO PAST SURGERIES         Home Medications    Prior to Admission medications   Medication Sig Start Date End Date Taking? Authorizing Provider  aspirin EC 81 MG tablet Take 1 tablet (81 mg total) by mouth daily. 12/16/12   Othella Boyer, MD  atorvastatin (LIPITOR) 20 MG tablet Take 1 tablet (20 mg total) by mouth daily. 04/11/16   Binnie Rail, MD    ciprofloxacin (CIPRO) 750 MG tablet Take 1 tablet (750 mg total) by mouth 2 (two) times daily. 04/02/17   Noel Rodier, PA-C  hydrochlorothiazide (HYDRODIURIL) 25 MG tablet Take 1 tablet (25 mg total) by mouth daily. 02/21/17   Binnie Rail, MD    Family History Family History  Problem Relation Age of Onset  . Diabetes Father   . Colon cancer Neg Hx     Social History Social History  Substance Use Topics  . Smoking status: Former Smoker    Packs/day: 0.20    Types: Cigarettes  . Smokeless tobacco: Never Used  . Alcohol use No     Allergies   Patient has no known allergies.   Review of Systems Review of Systems  Constitutional: Negative for chills and fever.  Skin: Positive for wound.  Neurological: Negative for numbness.     Physical Exam Updated Vital Signs BP (!) 172/161 (BP Location: Left Arm)   Pulse 71   Temp 98.2 F (36.8 C) (Oral)   Resp 18   Ht 5\' 10"  (1.778 m)   Wt 89.1 kg (196 lb 7 oz)   SpO2 99%   BMI 28.19 kg/m   Physical Exam  Constitutional: He is oriented to person, place, and time. He appears well-developed and well-nourished. No distress.  HENT:  Head: Normocephalic and atraumatic.  Neck: Normal range of motion.  Cardiovascular: Normal rate, regular rhythm and  intact distal pulses.   Pulmonary/Chest: Effort normal and breath sounds normal.  Musculoskeletal: Normal range of motion.  Full range of motion of ankle and toes without pain.  Neurological: He is alert and oriented to person, place, and time. He has normal strength. No sensory deficit. GCS eye subscore is 4. GCS verbal subscore is 5. GCS motor subscore is 6.  Skin: Skin is warm and dry. He is not diaphoretic.  Patient with lesion on dorsal right foot. Approximately 3 inches long, and 1 inch wide. No obvious drainage from the wound. Varying levels of healing. No significant tenderness to palpation around the edges of the wound or bony structures of the foot. No signs of  surrounding cellulitis. Pulses intact. Compartments soft.  Psychiatric: He has a normal mood and affect.  Nursing note and vitals reviewed.    ED Treatments / Results  Labs (all labs ordered are listed, but only abnormal results are displayed) Labs Reviewed - No data to display  EKG  EKG Interpretation None       Radiology No results found.  Procedures Procedures (including critical care time)  Medications Ordered in ED Medications  Tdap (BOOSTRIX) injection 0.5 mL (0.5 mLs Intramuscular Given 04/02/17 0921)     Initial Impression / Assessment and Plan / ED Course  I have reviewed the triage vital signs and the nursing notes.  Pertinent labs & imaging results that were available during my care of the patient were reviewed by me and considered in my medical decision making (see chart for details).     Patient with wound to the right foot after coming in contact with chemicals that burned through his shoes. Patient unsure what the chemical is. At this time no obvious sign of infection, however will prescribe prescription for antibiotics with Pseudomonas coverage. Patient to follow-up with wound care clinic for further management. Case discussed with attending, Dr. Zenia Resides evaluated the patient. Tdap given today. Discussed findings with patient. Return precautions given. Patient states he understands and agrees to plan.  Final Clinical Impressions(s) / ED Diagnoses   Final diagnoses:  Wound, open, foot, right, initial encounter    New Prescriptions Discharge Medication List as of 04/02/2017  9:05 AM    START taking these medications   Details  ciprofloxacin (CIPRO) 750 MG tablet Take 1 tablet (750 mg total) by mouth 2 (two) times daily., Starting Mon 04/02/2017, Print         [place], Eladio [place], PA-C 04/02/17 1544

## 2017-04-02 NOTE — ED Provider Notes (Signed)
Medical screening examination/treatment/procedure(s) were conducted as a shared visit with non-physician practitioner(s) and myself.  I personally evaluated the patient during the encounter.   EKG Interpretation None     66 year old male sustaining injury to his right foot at work several days ago when he had a chemical burn to his shoe. Today he has eschar on the dorsum of his foot. No surrounding erythema. No crepitus noted. Neurovascular status intact. Will prescribe Cipro as well as give referral to the wound care clinic   Lacretia Leigh, MD 04/02/17 [phone]

## 2017-04-02 NOTE — ED Notes (Signed)
Dressing applied to pt's wound.  Wound care explained to pt and family.  Pt and family expressed understanding.

## 2017-04-02 NOTE — Discharge Instructions (Signed)
Continue to keep the area clean and dry. Take antibiotics as prescribed. He may use Tylenol or ibuprofen as needed for pain control. It is very important that you follow up with the wound care center for further evaluation and management of your foot. Return to the emergency department if you develop fevers, chills, or any new or worsening symptoms while on antibiotics.

## 2017-04-16 ENCOUNTER — Encounter (HOSPITAL_BASED_OUTPATIENT_CLINIC_OR_DEPARTMENT_OTHER): Payer: Medicare Other | Attending: Internal Medicine

## 2017-04-16 DIAGNOSIS — T25721A Corrosion of third degree of right foot, initial encounter: Secondary | ICD-10-CM | POA: Diagnosis not present

## 2017-04-16 DIAGNOSIS — T6591XA Toxic effect of unspecified substance, accidental (unintentional), initial encounter: Secondary | ICD-10-CM | POA: Diagnosis not present

## 2017-04-16 DIAGNOSIS — Y990 Civilian activity done for income or pay: Secondary | ICD-10-CM | POA: Insufficient documentation

## 2017-04-16 DIAGNOSIS — Z8673 Personal history of transient ischemic attack (TIA), and cerebral infarction without residual deficits: Secondary | ICD-10-CM | POA: Insufficient documentation

## 2017-04-16 DIAGNOSIS — Z87891 Personal history of nicotine dependence: Secondary | ICD-10-CM | POA: Insufficient documentation

## 2017-04-16 DIAGNOSIS — I10 Essential (primary) hypertension: Secondary | ICD-10-CM | POA: Insufficient documentation

## 2017-04-17 DIAGNOSIS — S91301A Unspecified open wound, right foot, initial encounter: Secondary | ICD-10-CM | POA: Diagnosis not present

## 2017-04-23 DIAGNOSIS — T25321A Burn of third degree of right foot, initial encounter: Secondary | ICD-10-CM | POA: Diagnosis not present

## 2017-04-23 DIAGNOSIS — T25721A Corrosion of third degree of right foot, initial encounter: Secondary | ICD-10-CM | POA: Diagnosis not present

## 2017-04-23 DIAGNOSIS — Z87891 Personal history of nicotine dependence: Secondary | ICD-10-CM | POA: Diagnosis not present

## 2017-04-23 DIAGNOSIS — T6591XA Toxic effect of unspecified substance, accidental (unintentional), initial encounter: Secondary | ICD-10-CM | POA: Diagnosis not present

## 2017-04-23 DIAGNOSIS — Z8673 Personal history of transient ischemic attack (TIA), and cerebral infarction without residual deficits: Secondary | ICD-10-CM | POA: Diagnosis not present

## 2017-04-23 DIAGNOSIS — I10 Essential (primary) hypertension: Secondary | ICD-10-CM | POA: Diagnosis not present

## 2017-04-28 ENCOUNTER — Telehealth: Payer: Self-pay | Admitting: Internal Medicine

## 2017-04-28 DIAGNOSIS — I10 Essential (primary) hypertension: Secondary | ICD-10-CM

## 2017-05-03 ENCOUNTER — Encounter (HOSPITAL_BASED_OUTPATIENT_CLINIC_OR_DEPARTMENT_OTHER): Payer: Medicare Other | Attending: Internal Medicine

## 2017-05-03 DIAGNOSIS — I10 Essential (primary) hypertension: Secondary | ICD-10-CM | POA: Diagnosis not present

## 2017-05-03 DIAGNOSIS — X58XXXA Exposure to other specified factors, initial encounter: Secondary | ICD-10-CM | POA: Insufficient documentation

## 2017-05-03 DIAGNOSIS — Z8673 Personal history of transient ischemic attack (TIA), and cerebral infarction without residual deficits: Secondary | ICD-10-CM | POA: Insufficient documentation

## 2017-05-03 DIAGNOSIS — T25321A Burn of third degree of right foot, initial encounter: Secondary | ICD-10-CM | POA: Insufficient documentation

## 2017-05-03 DIAGNOSIS — S91301A Unspecified open wound, right foot, initial encounter: Secondary | ICD-10-CM | POA: Diagnosis not present

## 2017-05-03 DIAGNOSIS — T25721A Corrosion of third degree of right foot, initial encounter: Secondary | ICD-10-CM | POA: Diagnosis not present

## 2017-05-04 ENCOUNTER — Ambulatory Visit: Payer: Medicare Other | Admitting: Internal Medicine

## 2017-05-07 MED ORDER — HYDROCHLOROTHIAZIDE 25 MG PO TABS: 25.0000 mg | ORAL_TABLET | Freq: Every day | ORAL | 0 refills | Status: DC

## 2017-05-07 NOTE — Telephone Encounter (Signed)
Reviewed chart pt is due for f/u appt sent 30 day script to [facility] until appt...Todd Mendoza

## 2017-05-07 NOTE — Telephone Encounter (Signed)
Pt wife called in and said that he needs a refill on  hydrochlorothiazide (HYDRODIURIL) 25 MG tablet [[id number]]    Pharmacy on file

## 2017-05-10 DIAGNOSIS — T25421A Corrosion of unspecified degree of right foot, initial encounter: Secondary | ICD-10-CM | POA: Diagnosis not present

## 2017-05-10 DIAGNOSIS — Z8673 Personal history of transient ischemic attack (TIA), and cerebral infarction without residual deficits: Secondary | ICD-10-CM | POA: Diagnosis not present

## 2017-05-10 DIAGNOSIS — T25321A Burn of third degree of right foot, initial encounter: Secondary | ICD-10-CM | POA: Diagnosis not present

## 2017-05-10 DIAGNOSIS — I10 Essential (primary) hypertension: Secondary | ICD-10-CM | POA: Diagnosis not present

## 2017-05-15 ENCOUNTER — Other Ambulatory Visit (INDEPENDENT_AMBULATORY_CARE_PROVIDER_SITE_OTHER): Payer: Medicare Other

## 2017-05-15 ENCOUNTER — Encounter: Payer: Self-pay | Admitting: Internal Medicine

## 2017-05-15 ENCOUNTER — Ambulatory Visit (INDEPENDENT_AMBULATORY_CARE_PROVIDER_SITE_OTHER): Payer: Medicare Other | Admitting: Internal Medicine

## 2017-05-15 VITALS — BP 120/72 | HR 70 | Temp 98.0°F | Resp 16 | Wt 196.0 lb

## 2017-05-15 DIAGNOSIS — Z23 Encounter for immunization: Secondary | ICD-10-CM | POA: Diagnosis not present

## 2017-05-15 DIAGNOSIS — I10 Essential (primary) hypertension: Secondary | ICD-10-CM

## 2017-05-15 DIAGNOSIS — E7800 Pure hypercholesterolemia, unspecified: Secondary | ICD-10-CM

## 2017-05-15 DIAGNOSIS — Z8673 Personal history of transient ischemic attack (TIA), and cerebral infarction without residual deficits: Secondary | ICD-10-CM

## 2017-05-15 LAB — COMPREHENSIVE METABOLIC PANEL
ALBUMIN: 3.9 g/dL (ref 3.5–5.2)
ALK PHOS: 82 U/L (ref 39–117)
ALT: 28 U/L (ref 0–53)
AST: 39 U/L — AB (ref 0–37)
BUN: 14 mg/dL (ref 6–23)
CALCIUM: 9.6 mg/dL (ref 8.4–10.5)
CO2: 32 mEq/L (ref 19–32)
CREATININE: 0.99 mg/dL (ref 0.40–1.50)
Chloride: 103 mEq/L (ref 96–112)
GFR: 96.93 mL/min (ref >60.00)
Glucose, Bld: 81 mg/dL (ref 70–99)
Potassium: 3.8 mEq/L (ref 3.5–5.1)
Sodium: 138 mEq/L (ref 135–145)
TOTAL PROTEIN: 8.3 g/dL (ref 6.0–8.3)
Total Bilirubin: 0.6 mg/dL (ref 0.2–1.2)

## 2017-05-15 NOTE — Patient Instructions (Addendum)
Call your insurance company and see if Cologuard is covered.   All other Health Maintenance issues reviewed.   All recommended immunizations and age-appropriate screenings are up-to-date or discussed.  Pneumovax immunization administered today.   Medications reviewed and updated.  No changes recommended at this time.    Please followup in one year

## 2017-05-15 NOTE — Assessment & Plan Note (Signed)
BP well controlled Current regimen effective and well tolerated Continue current medications at current doses cmp  

## 2017-05-15 NOTE — Assessment & Plan Note (Signed)
Lipids controlled and at goal Continue lipitor at current dose

## 2017-05-15 NOTE — Progress Notes (Signed)
Subjective:    Patient ID: Todd Mendoza, male    DOB: [birth year], 66 y.o.   MRN: [id number]  HPI The patient is here for follow up.  Hyperlipidemia: He is taking his medication daily. He is compliant with a low fat/cholesterol diet. He is exercising regularly. He denies myalgias.   Hypertension: He is taking his medication daily. He is compliant with a low sodium diet.  He denies chest pain, palpitations, edema, shortness of breath and regular headaches. He is exercising regularly - going to gym.      H/o CVA: he is taking his aspirin and statin daily.  He is exercising regularly and eating a healthy diet.      Medications and allergies reviewed with patient and updated if appropriate.  Patient Active Problem List   Diagnosis Date Noted  . ED (erectile dysfunction) 11/03/2016  . Lumbar radiculopathy 06/30/2016  . Family history of diabetes mellitus 03/14/2016  . Encounter for prostate cancer screening 03/14/2016  . H/O: CVA (cerebrovascular accident) 12/26/2012  . HTN (hypertension) 12/26/2012  . HLD (hyperlipidemia) 12/26/2012  . Allergic rhinitis 12/26/2012    Current Outpatient Prescriptions on File Prior to Visit  Medication Sig Dispense Refill  . aspirin EC 81 MG tablet Take 1 tablet (81 mg total) by mouth daily. 30 tablet 0  . atorvastatin (LIPITOR) 20 MG tablet TAKE 1 TABLET BY MOUTH DAILY 90 tablet 0  . hydrochlorothiazide (HYDRODIURIL) 25 MG tablet Take 1 tablet (25 mg total) by mouth daily. F/U appt is due must see MD for future refills 30 tablet 0   No current facility-administered medications on file prior to visit.     Past Medical History:  Diagnosis Date  . Hyperlipidemia   . Stroke ([place])   . Vertigo     Past Surgical History:  Procedure Laterality Date  . NO PAST SURGERIES      Social History   Social History  . Marital status: Single    Spouse name: N/A  . Number of children: N/A  . Years of education: N/A   Social History Main Topics  .  Smoking status: Former Smoker    Packs/day: 0.20    Types: Cigarettes  . Smokeless tobacco: Never Used  . Alcohol use No  . Drug use: No  . Sexual activity: Not Asked   Other Topics Concern  . None   Social History Narrative   Married   Psychologist, counselling daily    Family History  Problem Relation Age of Onset  . Diabetes Father   . Colon cancer Neg Hx     Review of Systems  Constitutional: Negative for chills and fever.  Respiratory: Positive for cough (dry, intermittent, not daily). Negative for shortness of breath and wheezing.   Cardiovascular: Negative for chest pain, palpitations and leg swelling.  Gastrointestinal: Negative for abdominal pain, blood in stool, constipation and diarrhea.       No gerd  Musculoskeletal: Negative for myalgias.  Neurological: Negative for weakness, light-headedness, numbness and headaches.       Objective:   Vitals:   05/15/17 1549  BP: 120/72  Pulse: 70  Resp: 16  Temp: 98 F (36.7 C)  SpO2: 96%   Wt Readings from Last 3 Encounters:  05/15/17 196 lb (88.9 kg)  04/02/17 196 lb 7 oz (89.1 kg)  03/15/17 191 lb (86.6 kg)   Body mass index is 28.12 kg/m.   Physical Exam    Constitutional: Appears well-developed and well-nourished. No distress.  HENT:  Head: Normocephalic and atraumatic.  Neck: Neck supple. No tracheal deviation present. No thyromegaly present.  No cervical lymphadenopathy Cardiovascular: Normal rate, regular rhythm and normal heart sounds.   No murmur heard. No carotid bruit .  No edema Pulmonary/Chest: Effort normal and breath sounds normal. No respiratory distress. No has no wheezes. No rales.  Skin: Skin is warm and dry. Not diaphoretic.  Psychiatric: Normal mood and affect. Behavior is normal.      Assessment & Plan:   Pneumovax today  See Problem List for Assessment and Plan of chronic medical problems.   FU annually

## 2017-05-15 NOTE — Assessment & Plan Note (Addendum)
Continue ASA, statin BP well controlled - continue hctz

## 2017-07-27 ENCOUNTER — Other Ambulatory Visit: Payer: Self-pay | Admitting: Internal Medicine

## 2017-10-30 NOTE — Patient Instructions (Addendum)
  Test(s) ordered today. Your results will be released to MyChart (or called to you) after review, usually within 72hours after test completion. If any changes need to be made, you will be notified at that same time.  No immunizations administered today.   Medications reviewed and updated.  No changes recommended at this time.  Please followup in 6 months   

## 2017-10-30 NOTE — Progress Notes (Signed)
Subjective:    Patient ID: Todd Mendoza, male    DOB: May 08, 1951, 67 y.o.   MRN: [id number]  HPI The patient is here for follow up.  Hypertension: He is taking his medication daily. He is compliant with a low sodium diet.  He denies chest pain, palpitations, edema, shortness of breath and regular headaches. He is exercising regularly.    Hyperlipidemia: He is taking his medication daily. He is compliant with a low fat/cholesterol diet. He is exercising regularly. He denies myalgias.   Family history of diabetes: He is eating healthy and compliant with a low sugar/carbohydrate diet.  He is exercising regularly.    Medications and allergies reviewed with patient and updated if appropriate.  Patient Active Problem List   Diagnosis Date Noted  . ED (erectile dysfunction) 11/03/2016  . Lumbar radiculopathy 06/30/2016  . Family history of diabetes mellitus 03/14/2016  . Encounter for prostate cancer screening 03/14/2016  . H/O: CVA (cerebrovascular accident) 12/26/2012  . HTN (hypertension) 12/26/2012  . HLD (hyperlipidemia) 12/26/2012  . Allergic rhinitis 12/26/2012    Current Outpatient Medications on File Prior to Visit  Medication Sig Dispense Refill  . aspirin EC 81 MG tablet Take 1 tablet (81 mg total) by mouth daily. 30 tablet 0  . atorvastatin (LIPITOR) 20 MG tablet TAKE 1 TABLET BY MOUTH DAILY 90 tablet 2  . hydrochlorothiazide (HYDRODIURIL) 25 MG tablet Take 1 tablet (25 mg total) by mouth daily. F/U appt is due must see MD for future refills 30 tablet 0   No current facility-administered medications on file prior to visit.     Past Medical History:  Diagnosis Date  . Hyperlipidemia   . Stroke ([place])   . Vertigo     Past Surgical History:  Procedure Laterality Date  . NO PAST SURGERIES      Social History   Socioeconomic History  . Marital status: Single    Spouse name: None  . Number of children: None  . Years of education: None  . Highest education  level: None  Social Needs  . Financial resource strain: None  . Food insecurity - worry: None  . Food insecurity - inability: None  . Transportation needs - medical: None  . Transportation needs - non-medical: None  Occupational History  . None  Tobacco Use  . Smoking status: Former Smoker    Packs/day: 0.20    Types: Cigarettes  . Smokeless tobacco: Never Used  Substance and Sexual Activity  . Alcohol use: No    Alcohol/week: 0.0 oz  . Drug use: No  . Sexual activity: None  Other Topics Concern  . None  Social History Narrative   Married   Psychologist, counselling daily    Family History  Problem Relation Age of Onset  . Diabetes Father   . Colon cancer Neg Hx     Review of Systems  Constitutional: Negative for chills and fever.  Respiratory: Negative for cough, shortness of breath and wheezing.   Cardiovascular: Negative.   Neurological: Negative for dizziness, light-headedness and headaches.       Objective:   Vitals:   10/31/17 1504  BP: 130/76  Pulse: 79  Resp: 16  Temp: 98.9 F (37.2 C)  SpO2: 95%   Wt Readings from Last 3 Encounters:  10/31/17 198 lb (89.8 kg)  05/15/17 196 lb (88.9 kg)  04/02/17 196 lb 7 oz (89.1 kg)   Body mass index is 28.41 kg/m.   Physical Exam    Constitutional:  Appears well-developed and well-nourished. No distress.  HENT:  Head: Normocephalic and atraumatic.  Neck: Neck supple. No tracheal deviation present. No thyromegaly present.  No cervical lymphadenopathy Cardiovascular: Normal rate, regular rhythm and normal heart sounds.   No murmur heard. No carotid bruit .  No edema Pulmonary/Chest: Effort normal and breath sounds normal. No respiratory distress. No has no wheezes. No rales.  Skin: Skin is warm and dry. Not diaphoretic.  Psychiatric: Normal mood and affect. Behavior is normal.      Assessment & Plan:    See Problem List for Assessment and Plan of chronic medical problems.

## 2017-10-31 ENCOUNTER — Encounter: Payer: Self-pay | Admitting: Internal Medicine

## 2017-10-31 ENCOUNTER — Ambulatory Visit (INDEPENDENT_AMBULATORY_CARE_PROVIDER_SITE_OTHER): Payer: Medicare Other | Admitting: Internal Medicine

## 2017-10-31 ENCOUNTER — Other Ambulatory Visit (INDEPENDENT_AMBULATORY_CARE_PROVIDER_SITE_OTHER): Payer: Medicare Other

## 2017-10-31 VITALS — BP 130/76 | HR 79 | Temp 98.9°F | Resp 16 | Wt 198.0 lb

## 2017-10-31 DIAGNOSIS — I10 Essential (primary) hypertension: Secondary | ICD-10-CM

## 2017-10-31 DIAGNOSIS — E7849 Other hyperlipidemia: Secondary | ICD-10-CM | POA: Diagnosis not present

## 2017-10-31 DIAGNOSIS — Z125 Encounter for screening for malignant neoplasm of prostate: Secondary | ICD-10-CM

## 2017-10-31 DIAGNOSIS — Z833 Family history of diabetes mellitus: Secondary | ICD-10-CM

## 2017-10-31 LAB — COMPREHENSIVE METABOLIC PANEL
ALT: 30 U/L (ref 0–53)
AST: 39 U/L — AB (ref 0–37)
Albumin: 4 g/dL (ref 3.5–5.2)
Alkaline Phosphatase: 76 U/L (ref 39–117)
BILIRUBIN TOTAL: 0.6 mg/dL (ref 0.2–1.2)
BUN: 12 mg/dL (ref 6–23)
CALCIUM: 9.3 mg/dL (ref 8.4–10.5)
CO2: 33 meq/L — AB (ref 19–32)
CREATININE: 1.01 mg/dL (ref 0.40–1.50)
Chloride: 101 mEq/L (ref 96–112)
GFR: 94.59 mL/min (ref >60.00)
Glucose, Bld: 85 mg/dL (ref 70–99)
Potassium: 3.8 mEq/L (ref 3.5–5.1)
SODIUM: 138 meq/L (ref 135–145)
Total Protein: 7.8 g/dL (ref 6.0–8.3)

## 2017-10-31 LAB — CBC WITH DIFFERENTIAL/PLATELET
BASOS ABS: 0 10*3/uL (ref 0.0–0.1)
Basophils Relative: 0.7 % (ref 0.0–3.0)
EOS ABS: 0.3 10*3/uL (ref 0.0–0.7)
Eosinophils Relative: 4.4 % (ref 0.0–5.0)
HCT: 37.3 % — ABNORMAL LOW (ref 39.0–52.0)
HEMOGLOBIN: 12.1 g/dL — AB (ref 13.0–17.0)
LYMPHS PCT: 50.9 % — AB (ref 12.0–46.0)
Lymphs Abs: 3.1 10*3/uL (ref 0.7–4.0)
MCHC: 32.6 g/dL (ref 30.0–36.0)
MCV: 88.5 fl (ref 78.0–100.0)
MONO ABS: 0.6 10*3/uL (ref 0.1–1.0)
Monocytes Relative: 9.4 % (ref 3.0–12.0)
Neutro Abs: 2.1 10*3/uL (ref 1.4–7.7)
Neutrophils Relative %: 34.6 % — ABNORMAL LOW (ref 43.0–77.0)
Platelets: 286 10*3/uL (ref 150.0–400.0)
RBC: 4.21 Mil/uL — ABNORMAL LOW (ref 4.22–5.81)
RDW: 13.1 % (ref 11.5–15.5)
WBC: 6 10*3/uL (ref 4.0–10.5)

## 2017-10-31 LAB — PSA, MEDICARE: PSA: 0.61 ng/mL (ref 0.10–4.00)

## 2017-10-31 LAB — LIPID PANEL
CHOL/HDL RATIO: 4
CHOLESTEROL: 131 mg/dL (ref 0–200)
HDL: 34 mg/dL — AB (ref >39.00)
LDL Cholesterol: 71 mg/dL (ref 0–99)
NonHDL: 97.37
TRIGLYCERIDES: 132 mg/dL (ref 0.0–149.0)
VLDL: 26.4 mg/dL (ref 0.0–40.0)

## 2017-10-31 LAB — HEMOGLOBIN A1C: Hgb A1c MFr Bld: 5.1 % (ref 4.6–6.5)

## 2017-10-31 NOTE — Assessment & Plan Note (Signed)
Check lipid panel, CMP Continue daily statin Regular exercise and healthy diet encouraged  

## 2017-10-31 NOTE — Assessment & Plan Note (Signed)
Has a healthy lifestyle-and is exercising regularly Will check A1c

## 2017-10-31 NOTE — Assessment & Plan Note (Signed)
Given his age, check PSA

## 2017-10-31 NOTE — Assessment & Plan Note (Signed)
BP well controlled Current regimen effective and well tolerated Continue current medications at current doses Cmp, cbc 

## 2018-01-08 ENCOUNTER — Other Ambulatory Visit: Payer: Self-pay

## 2018-01-08 ENCOUNTER — Ambulatory Visit: Payer: Medicare Other | Admitting: Physician Assistant

## 2018-01-08 ENCOUNTER — Encounter: Payer: Self-pay | Admitting: Physician Assistant

## 2018-01-08 VITALS — BP 144/84 | HR 72 | Temp 98.4°F | Ht 67.72 in | Wt 204.4 lb

## 2018-01-08 DIAGNOSIS — J22 Unspecified acute lower respiratory infection: Secondary | ICD-10-CM

## 2018-01-08 DIAGNOSIS — R05 Cough: Secondary | ICD-10-CM | POA: Diagnosis not present

## 2018-01-08 DIAGNOSIS — R059 Cough, unspecified: Secondary | ICD-10-CM

## 2018-01-08 MED ORDER — CEFDINIR 300 MG PO CAPS: 300.0000 mg | ORAL_CAPSULE | Freq: Two times a day (BID) | ORAL | 0 refills | Status: DC

## 2018-01-08 MED ORDER — IPRATROPIUM BROMIDE 0.03 % NA SOLN: 2.0000 | Freq: Two times a day (BID) | NASAL | 0 refills | Status: DC

## 2018-01-08 MED ORDER — BENZONATATE 100 MG PO CAPS: 100.0000 mg | ORAL_CAPSULE | Freq: Three times a day (TID) | ORAL | 0 refills | Status: DC | PRN

## 2018-01-08 NOTE — Patient Instructions (Addendum)
Please hydrate well with 64 oz of water if not more.  This will help thin out the mucus, and help the mucinex to work.   You may continue your mucinex at home. Please take the antibiotic to completion. Cough, Adult A cough helps to clear your throat and lungs. A cough may last only 2-3 weeks (acute), or it may last longer than 8 weeks (chronic). Many different things can cause a cough. A cough may be a sign of an illness or another medical condition. Follow these instructions at home:  Pay attention to any changes in your cough.  Take medicines only as told by your doctor. ? If you were prescribed an antibiotic medicine, take it as told by your doctor. Do not stop taking it even if you start to feel better. ? Talk with your doctor before you try using a cough medicine.  Drink enough fluid to keep your pee (urine) clear or pale yellow.  If the air is dry, use a cold steam vaporizer or humidifier in your home.  Stay away from things that make you cough at work or at home.  If your cough is worse at night, try using extra pillows to raise your head up higher while you sleep.  Do not smoke, and try not to be around smoke. If you need help quitting, ask your doctor.  Do not have caffeine.  Do not drink alcohol.  Rest as needed. Contact a doctor if:  You have new problems (symptoms).  You cough up yellow fluid (pus).  Your cough does not get better after 2-3 weeks, or your cough gets worse.  Medicine does not help your cough and you are not sleeping well.  You have pain that gets worse or pain that is not helped with medicine.  You have a fever.  You are losing weight and you do not know why.  You have night sweats. Get help right away if:  You cough up blood.  You have trouble breathing.  Your heartbeat is very fast. This information is not intended to replace advice given to you by your health care provider. Make sure you discuss any questions you have with your health  care provider. Document Released: 05/25/2011 Document Revised: 02/17/2016 Document Reviewed: 11/18/2014 Elsevier Interactive Patient Education  2018 [employer].     IF you received an x-ray today, you will receive an invoice from [facility]. Please contact [facility] at [phone] with questions or concerns regarding your invoice.   IF you received labwork today, you will receive an invoice from [place]. Please contact [facility] at [phone] with questions or concerns regarding your invoice.   Our billing staff will not be able to assist you with questions regarding bills from these companies.  You will be contacted with the lab results as soon as they are available. The fastest way to get your results is to activate your My Chart account. Instructions are located on the last page of this paperwork. If you have not heard from [place] regarding the results in 2 weeks, please contact this office.

## 2018-01-08 NOTE — Progress Notes (Signed)
PRIMARY CARE AT [facility] [address], [place] [address] [phone]  Date:  01/08/2018   Name:  Todd Mendoza   DOB:  Dec 15, 1950   MRN:  [id number]  PCP:  Binnie Rail, MD    History of Present Illness:  Todd Mendoza is a 67 y.o. male patient who presents to PCP with  Chief Complaint  Patient presents with  . Chest congestion    Cough, drk green flem, and Throat hurts since wednesday  He feels very fatigued at this time.   Sore throat and hoarseness. Productive cough with thick green mucus.   No fever.   No nasal congestion or runny nose No sob or dyspnea.   Non-smoker.   He has been taking the mucinex, which helps very temporary  Patient Active Problem List   Diagnosis Date Noted  . ED (erectile dysfunction) 11/03/2016  . Lumbar radiculopathy 06/30/2016  . Family history of diabetes mellitus 03/14/2016  . Encounter for prostate cancer screening 03/14/2016  . H/O: CVA (cerebrovascular accident) 12/26/2012  . HTN (hypertension) 12/26/2012  . HLD (hyperlipidemia) 12/26/2012  . Allergic rhinitis 12/26/2012    Past Medical History:  Diagnosis Date  . Hyperlipidemia   . Stroke ([place])   . Vertigo     Past Surgical History:  Procedure Laterality Date  . NO PAST SURGERIES      Social History   Tobacco Use  . Smoking status: Former Smoker    Packs/day: 0.20    Types: Cigarettes  . Smokeless tobacco: Never Used  Substance Use Topics  . Alcohol use: No    Alcohol/week: 0.0 oz  . Drug use: No    Family History  Problem Relation Age of Onset  . Diabetes Father   . Colon cancer Neg Hx     No Known Allergies  Medication list has been reviewed and updated.  Current Outpatient Medications on File Prior to Visit  Medication Sig Dispense Refill  . aspirin EC 81 MG tablet Take 1 tablet (81 mg total) by mouth daily. 30 tablet 0  . atorvastatin (LIPITOR) 20 MG tablet TAKE 1 TABLET BY MOUTH DAILY 90 tablet 2  . hydrochlorothiazide (HYDRODIURIL) 25 MG tablet  Take 1 tablet (25 mg total) by mouth daily. F/U appt is due must see MD for future refills 30 tablet 0   No current facility-administered medications on file prior to visit.     ROS ROS otherwise unremarkable unless listed above.  Physical Examination: BP (!) 144/84 (BP Location: Left Arm, Patient Position: Sitting, Cuff Size: Normal)   Pulse 72   Temp 98.4 F (36.9 C) (Oral)   Ht 5' 7.72" (1.72 m)   Wt 204 lb 6.4 oz (92.7 kg)   SpO2 94%   BMI 31.34 kg/m  Ideal Body Weight: Weight in (lb) to have BMI = 25: 162.7  Physical Exam  Constitutional: He is oriented to person, place, and time. He appears well-developed and well-nourished. No distress.  HENT:  Head: Normocephalic and atraumatic.  Right Ear: Tympanic membrane, external ear and ear canal normal.  Left Ear: Tympanic membrane, external ear and ear canal normal.  Nose: Rhinorrhea present. No mucosal edema. Right sinus exhibits no maxillary sinus tenderness and no frontal sinus tenderness. Left sinus exhibits no maxillary sinus tenderness and no frontal sinus tenderness.  Mouth/Throat: No uvula swelling. Posterior oropharyngeal erythema (minimal) present. No oropharyngeal exudate or posterior oropharyngeal edema.  Eyes: Pupils are equal, round, and reactive to light. Conjunctivae, EOM and lids are normal.  Right eye exhibits normal extraocular motion. Left eye exhibits normal extraocular motion.  Neck: Trachea normal and full passive range of motion without pain. No edema and no erythema present.  Cardiovascular: Normal rate, regular rhythm and normal heart sounds.  Pulmonary/Chest: Effort normal. No respiratory distress. He has no decreased breath sounds. He has no wheezes. He has no rhonchi.  Neurological: He is alert and oriented to person, place, and time.  Skin: Skin is warm and dry. He is not diaphoretic.  Psychiatric: He has a normal mood and affect. His behavior is normal.     Assessment and Plan: Todd Mendoza is a 67  y.o. male who is here today for cc of  Chief Complaint  Patient presents with  . Chest congestion    Cough, drk green flem, and Throat hurts since wednesday   Lower respiratory infection (e.g., bronchitis, pneumonia, pneumonitis, pulmonitis) - Plan: ipratropium (ATROVENT) 0.03 % nasal spray, cefdinir (OMNICEF) 300 MG capsule  Cough - Plan: ipratropium (ATROVENT) 0.03 % nasal spray, cefdinir (OMNICEF) 300 MG capsule, benzonatate (TESSALON) 100 MG capsule  Ivar Drape, PA-C [facility] 4/18/20197:34 AM

## 2018-01-25 ENCOUNTER — Other Ambulatory Visit: Payer: Self-pay | Admitting: Internal Medicine

## 2018-01-25 DIAGNOSIS — I10 Essential (primary) hypertension: Secondary | ICD-10-CM

## 2018-02-12 ENCOUNTER — Telehealth: Payer: Self-pay | Admitting: Emergency Medicine

## 2018-02-12 NOTE — Telephone Encounter (Signed)
Error

## 2018-03-18 NOTE — Progress Notes (Addendum)
Subjective:   Todd Mendoza is a 67 y.o. male who presents for Medicare Annual/Subsequent preventive examination.  Review of Systems:  No ROS.  Medicare Wellness Visit. Additional risk factors are reflected in the social history.  Cardiac Risk Factors include: advanced age (>11men, >22 women);dyslipidemia;hypertension;male gender Sleep patterns: feels rested on waking, gets up 1 times nightly to void and sleeps 7 hours nightly.    Home Safety/Smoke Alarms: Feels safe in home. Smoke alarms in place.  Living environment; residence and Firearm Safety: 1-story house/ trailer, no firearms., Lives with wife, no needs for DME, good support system Seat Belt Safety/Bike Helmet: Wears seat belt.   PSA-  Lab Results  Component Value Date   PSA 0.61 10/31/2017   PSA 0.34 11/03/2016   PSA 0.56 03/20/2016       Objective:    Vitals: BP (!) 152/83   Pulse 61   Resp 18   Ht 5\' 8"  (1.727 m)   Wt 195 lb (88.5 kg)   SpO2 98%   BMI 29.65 kg/m   Body mass index is 29.65 kg/m.  Advanced Directives 03/19/2018 04/02/2017 03/15/2017 12/11/2016 09/10/2014  Does Patient Have a Medical Advance Directive? Yes No No No No  Type of Paramedic of [place];Living will - - - -  Copy of [place] in Chart? No - copy requested - - - -  Would patient like information on creating a medical advance directive? - No - Patient declined Yes (ED - Information included in AVS) - Yes - Educational materials given    Tobacco Social History   Tobacco Use  Smoking Status Former Smoker  . Packs/day: 0.20  . Types: Cigarettes  Smokeless Tobacco Never Used     Counseling given: Not Answered  Past Medical History:  Diagnosis Date  . Hyperlipidemia   . Stroke ([place])   . Vertigo    Past Surgical History:  Procedure Laterality Date  . NO PAST SURGERIES     Family History  Problem Relation Age of Onset  . Diabetes Father   . Colon cancer Neg Hx    Social History    Socioeconomic History  . Marital status: Married    Spouse name: Not on file  . Number of children: Not on file  . Years of education: Not on file  . Highest education level: Not on file  Occupational History  . Not on file  Social Needs  . Financial resource strain: Not hard at all  . Food insecurity:    Worry: Never true    Inability: Never true  . Transportation needs:    Medical: No    Non-medical: No  Tobacco Use  . Smoking status: Former Smoker    Packs/day: 0.20    Types: Cigarettes  . Smokeless tobacco: Never Used  Substance and Sexual Activity  . Alcohol use: No    Alcohol/week: 0.0 oz  . Drug use: No  . Sexual activity: Yes  Lifestyle  . Physical activity:    Days per week: 5 days    Minutes per session: 50 min  . Stress: Not at all  Relationships  . Social connections:    Talks on phone: More than three times a week    Gets together: More than three times a week    Attends religious service: More than 4 times per year    Active member of club or organization: Yes    Attends meetings of clubs or organizations: More than  4 times per year    Relationship status: Married  Other Topics Concern  . Not on file  Social History Narrative   Married   Walks daily    Outpatient Encounter Medications as of 03/19/2018  Medication Sig  . aspirin EC 81 MG tablet Take 1 tablet (81 mg total) by mouth daily.  Marland Kitchen atorvastatin (LIPITOR) 20 MG tablet TAKE 1 TABLET BY MOUTH DAILY  . hydrochlorothiazide (HYDRODIURIL) 25 MG tablet TAKE 1 TABLET BY MOUTH EVERY DAY  . ibuprofen (ADVIL,MOTRIN) 200 MG tablet Take 200 mg by mouth every 6 (six) hours as needed.  . [DISCONTINUED] benzonatate (TESSALON) 100 MG capsule Take 1-2 capsules (100-200 mg total) by mouth 3 (three) times daily as needed for cough. (Patient not taking: Reported on 03/19/2018)  . [DISCONTINUED] cefdinir (OMNICEF) 300 MG capsule Take 1 capsule (300 mg total) by mouth 2 (two) times daily. (Patient not taking:  Reported on 03/19/2018)  . [DISCONTINUED] hydrochlorothiazide (HYDRODIURIL) 25 MG tablet Take 1 tablet (25 mg total) by mouth daily. F/U appt is due must see MD for future refills (Patient not taking: Reported on 03/19/2018)  . [DISCONTINUED] ipratropium (ATROVENT) 0.03 % nasal spray Place 2 sprays into both nostrils 2 (two) times daily. (Patient not taking: Reported on 03/19/2018)   No facility-administered encounter medications on file as of 03/19/2018.     Activities of Daily Living In your present state of health, do you have any difficulty performing the following activities: 03/19/2018  Hearing? N  Vision? N  Difficulty concentrating or making decisions? N  Walking or climbing stairs? N  Dressing or bathing? N  Doing errands, shopping? N  Preparing Food and eating ? N  Using the Toilet? N  In the past six months, have you accidently leaked urine? N  Do you have problems with loss of bowel control? N  Managing your Medications? N  Managing your Finances? N  Housekeeping or managing your Housekeeping? N  Some recent data might be hidden    Patient Care Team: Binnie Rail, MD as PCP - General (Internal Medicine)   Assessment:   This is a routine wellness examination for Todd Mendoza. Physical assessment deferred to PCP.   Exercise Activities and Dietary recommendations Current Exercise Habits: The patient has a physically strenous job, but has no regular exercise apart from work., Exercise limited by: None identified  Diet (meal preparation, eat out, water intake, caffeinated beverages, dairy products, fruits and vegetables): in general, a "healthy" diet  , well balanced   Reviewed heart healthy diet, Encouraged patient to increase daily water and fluid intake. Diet education was attached to patient's AVS.  Goals    . Patient Stated     I want to increase my physical activity by starting to go back to the [facility] twice weekly. Watch my diet for fat and salt. 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     . Weight (lb) < 200 lb (90.7 kg)     Continue to exercise and eat as healthy as possible       Fall Risk Fall Risk  03/19/2018 01/08/2018 03/15/2017 03/14/2016 09/10/2014  Falls in the past year? No No No No No  Number falls in past yr: - - - - -  Risk for fall due to : - - - - -   Depression Screen PHQ 2/9 Scores 03/19/2018 01/08/2018 03/15/2017 03/14/2016  PHQ - 2 Score 0 0 0  0    Cognitive Function       Ad8 score reviewed for issues:  Issues making decisions: no  Less interest in hobbies / activities: no  Repeats questions, stories (family complaining): no  Trouble using ordinary gadgets (microwave, computer, phone):no  Forgets the month or year: no  Mismanaging finances: no  Remembering appts: no  Daily problems with thinking and/or memory: no Ad8 score is= 0  Immunization History  Administered Date(s) Administered  . Pneumococcal Conjugate-13 04/11/2016  . Pneumococcal Polysaccharide-23 05/15/2017  . Tdap 04/02/2017   Screening Tests Health Maintenance  Topic Date Due  . COLONOSCOPY  03/28/2000  . INFLUENZA VACCINE  07/03/2018 (Originally 04/25/2018)  . TETANUS/TDAP  04/03/2027  . Hepatitis C Screening  Completed  . PNA vac Low Risk Adult  Completed      Plan:    Patient declined colonoscopy screening and states he will do Cologuard screening. Nurse will order Cologuard.  Continue doing brain stimulating activities (puzzles, reading, adult coloring books, staying active) to keep memory sharp.   Continue to eat heart healthy diet (full of fruits, vegetables, whole grains, lean protein, water--limit salt, fat, and sugar intake) and increase physical activity as tolerated.  I have personally reviewed and noted the following in the patient's chart:   . Medical and social history . Use of alcohol, tobacco or illicit drugs  . Current medications and supplements . Functional ability  and status . Nutritional status . Physical activity . Advanced directives . List of other physicians . Vitals . Screenings to include cognitive, depression, and falls . Referrals and appointments  In addition, I have reviewed and discussed with patient certain preventive protocols, quality metrics, and best practice recommendations. A written personalized care plan for preventive services as well as general preventive health recommendations were provided to patient.     Todd Cowboy, RN  03/19/2018    Medical screening examination/treatment/procedure(s) were performed by non-physician practitioner and as supervising physician I was immediately available for consultation/collaboration. I agree with above. Binnie Rail, MD

## 2018-03-19 ENCOUNTER — Ambulatory Visit (INDEPENDENT_AMBULATORY_CARE_PROVIDER_SITE_OTHER): Payer: Medicare Other | Admitting: *Deleted

## 2018-03-19 VITALS — BP 152/83 | HR 61 | Resp 18 | Ht 68.0 in | Wt 195.0 lb

## 2018-03-19 DIAGNOSIS — Z0000 Encounter for general adult medical examination without abnormal findings: Secondary | ICD-10-CM

## 2018-03-19 NOTE — Patient Instructions (Addendum)
Continue doing brain stimulating activities (puzzles, reading, adult coloring books, staying active) to keep memory sharp.   Continue to eat heart healthy diet (full of fruits, vegetables, whole grains, lean protein, water--limit salt, fat, and sugar intake) and increase physical activity as tolerated.   Todd Mendoza , Thank you for taking time to come for your Medicare Wellness Visit. I appreciate your ongoing commitment to your health goals. Please review the following plan we discussed and let me know if I can assist you in the future.   These are the goals we discussed: Goals    . Patient Stated     I want to increase my physical activity by starting to go back to the [facility] twice weekly. Watch my diet for fat and salt. 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     . Weight (lb) < 200 lb (90.7 kg)     Continue to exercise and eat as healthy as possible       This is a list of the screening recommended for you and due dates:  Health Maintenance  Topic Date Due  . Colon Cancer Screening  03/28/2000  . Flu Shot  07/03/2018*  . Tetanus Vaccine  04/03/2027  .  Hepatitis C: One time screening is recommended by Center for Disease Control  (CDC) for  adults born from [birth year] through 1966.   Completed  . Pneumonia vaccines  Completed  *Topic was postponed. The date shown is not the original due date.    DASH Eating Plan DASH stands for "Dietary Approaches to Stop Hypertension." The DASH eating plan is a healthy eating plan that has been shown to reduce high blood pressure (hypertension). It may also reduce your risk for type 2 diabetes, heart disease, and stroke. The DASH eating plan may also help with weight loss. What are tips for following this plan? General guidelines  Avoid eating more than 2,300 mg (milligrams) of salt (sodium) a day. If you have hypertension, you may need to reduce your sodium intake to 1,500 mg a day.  Limit  alcohol intake to no more than 1 drink a day for nonpregnant women and 2 drinks a day for men. One drink equals 12 oz of beer, 5 oz of Rossy Virag, or 1 oz of hard liquor.  Work with your health care provider to maintain a healthy body weight or to lose weight. Ask what an ideal weight is for you.  Get at least 30 minutes of exercise that causes your heart to beat faster (aerobic exercise) most days of the week. Activities may include walking, swimming, or biking.  Work with your health care provider or diet and nutrition specialist (dietitian) to adjust your eating plan to your individual calorie needs. Reading food labels  Check food labels for the amount of sodium per serving. Choose foods with less than 5 percent of the Daily Value of sodium. Generally, foods with less than 300 mg of sodium per serving fit into this eating plan.  To find whole grains, look for the word "whole" as the first word in the ingredient list. Shopping  Buy products labeled as "low-sodium" or "no salt added."  Buy fresh foods. Avoid canned foods and premade or frozen meals. Cooking  Avoid adding salt when cooking. Use salt-free seasonings or herbs instead of table salt or sea salt. Check with your health care provider or pharmacist before using salt substitutes.  Do not fry foods.  Cook foods using healthy methods such as baking, boiling, grilling, and broiling instead.  Cook with heart-healthy oils, such as olive, canola, soybean, or sunflower oil. Meal planning   Eat a balanced diet that includes: ? 5 or more servings of fruits and vegetables each day. At each meal, try to fill half of your plate with fruits and vegetables. ? Up to 6-8 servings of whole grains each day. ? Less than 6 oz of lean meat, poultry, or fish each day. A 3-oz serving of meat is about the same size as a deck of cards. One egg equals 1 oz. ? 2 servings of low-fat dairy each day. ? A serving of nuts, seeds, or beans 5 times each  week. ? Heart-healthy fats. Healthy fats called Omega-3 fatty acids are found in foods such as flaxseeds and coldwater fish, like sardines, salmon, and mackerel.  Limit how much you eat of the following: ? Canned or prepackaged foods. ? Food that is high in trans fat, such as fried foods. ? Food that is high in saturated fat, such as fatty meat. ? Sweets, desserts, sugary drinks, and other foods with added sugar. ? Full-fat dairy products.  Do not salt foods before eating.  Try to eat at least 2 vegetarian meals each week.  Eat more home-cooked food and less restaurant, buffet, and fast food.  When eating at a restaurant, ask that your food be prepared with less salt or no salt, if possible. What foods are recommended? The items listed may not be a complete list. Talk with your dietitian about what dietary choices are best for you. Grains Whole-grain or whole-wheat bread. Whole-grain or whole-wheat pasta. Brown rice. [place]. Bulgur. Whole-grain and low-sodium cereals. Pita bread. Low-fat, low-sodium crackers. Whole-wheat flour tortillas. Vegetables Fresh or frozen vegetables (raw, steamed, roasted, or grilled). Low-sodium or reduced-sodium tomato and vegetable juice. Low-sodium or reduced-sodium tomato sauce and tomato paste. Low-sodium or reduced-sodium canned vegetables. Fruits All fresh, dried, or frozen fruit. Canned fruit in natural juice (without added sugar). Meat and other protein foods Skinless chicken or [place]. Ground chicken or [place]. Pork with fat trimmed off. Fish and seafood. Egg whites. Dried beans, peas, or lentils. Unsalted nuts, nut butters, and seeds. Unsalted canned beans. Lean cuts of beef with fat trimmed off. Low-sodium, lean deli meat. Dairy Low-fat (1%) or fat-free (skim) milk. Fat-free, low-fat, or reduced-fat cheeses. Nonfat, low-sodium ricotta or cottage cheese. Low-fat or nonfat yogurt. Low-fat, low-sodium cheese. Fats and oils Soft margarine  without trans fats. Vegetable oil. Low-fat, reduced-fat, or light mayonnaise and salad dressings (reduced-sodium). Canola, safflower, olive, soybean, and sunflower oils. Avocado. Seasoning and other foods Herbs. Spices. Seasoning mixes without salt. Unsalted popcorn and pretzels. Fat-free sweets. What foods are not recommended? The items listed may not be a complete list. Talk with your dietitian about what dietary choices are best for you. Grains Baked goods made with fat, such as croissants, muffins, or some breads. Dry pasta or rice meal packs. Vegetables Creamed or fried vegetables. Vegetables in a cheese sauce. Regular canned vegetables (not low-sodium or reduced-sodium). Regular canned tomato sauce and paste (not low-sodium or reduced-sodium). Regular tomato and vegetable juice (not low-sodium or reduced-sodium). Angie Fava. Olives. Fruits Canned fruit in a light or heavy syrup. Fried fruit. Fruit in cream or butter sauce. Meat and other protein foods Fatty cuts of meat. Ribs. Fried meat. Berniece Salines. Sausage. Bologna and other processed lunch meats. Salami. Fatback. Hotdogs. Bratwurst. Salted nuts and seeds. Canned beans with added salt. Canned  or smoked fish. Whole eggs or egg yolks. Chicken or [place] with skin. Dairy Whole or 2% milk, cream, and half-and-half. Whole or full-fat cream cheese. Whole-fat or sweetened yogurt. Full-fat cheese. Nondairy creamers. Whipped toppings. Processed cheese and cheese spreads. Fats and oils Butter. Stick margarine. Lard. Shortening. Ghee. Bacon fat. Tropical oils, such as coconut, palm kernel, or palm oil. Seasoning and other foods Salted popcorn and pretzels. Onion salt, garlic salt, seasoned salt, table salt, and sea salt. Worcestershire sauce. Tartar sauce. Barbecue sauce. Teriyaki sauce. Soy sauce, including reduced-sodium. Steak sauce. Canned and packaged gravies. Fish sauce. Oyster sauce. Cocktail sauce. Horseradish that you find on the shelf. Ketchup.  Mustard. Meat flavorings and tenderizers. Bouillon cubes. Hot sauce and Tabasco sauce. Premade or packaged marinades. Premade or packaged taco seasonings. Relishes. Regular salad dressings. Where to find more information:  National Heart, Lung, and [place]: https://wilson-eaton.com/  American Heart Association: www.heart.org Summary  The DASH eating plan is a healthy eating plan that has been shown to reduce high blood pressure (hypertension). It may also reduce your risk for type 2 diabetes, heart disease, and stroke.  With the DASH eating plan, you should limit salt (sodium) intake to 2,300 mg a day. If you have hypertension, you may need to reduce your sodium intake to 1,500 mg a day.  When on the DASH eating plan, aim to eat more fresh fruits and vegetables, whole grains, lean proteins, low-fat dairy, and heart-healthy fats.  Work with your health care provider or diet and nutrition specialist (dietitian) to adjust your eating plan to your individual calorie needs. This information is not intended to replace advice given to you by your health care provider. Make sure you discuss any questions you have with your health care provider. Document Released: 08/31/2011 Document Revised: 09/04/2016 Document Reviewed: 09/04/2016 Elsevier Interactive Patient Education  2018 [employer].  Fat and Cholesterol Restricted Diet Getting too much fat and cholesterol in your diet may cause health problems. Following this diet helps keep your fat and cholesterol at normal levels. This can keep you from getting sick. What types of fat should I choose?  Choose monosaturated and polyunsaturated fats. These are found in foods such as olive oil, canola oil, flaxseeds, walnuts, almonds, and seeds.  Eat more omega-3 fats. Good choices include salmon, mackerel, sardines, tuna, flaxseed oil, and ground flaxseeds.  Limit saturated fats. These are in animal products such as meats, butter, and cream. They can also  be in plant products such as palm oil, palm kernel oil, and coconut oil.  Avoid foods with partially hydrogenated oils in them. These contain trans fats. Examples of foods that have trans fats are stick margarine, some tub margarines, cookies, crackers, and other baked goods. What general guidelines do I need to follow?  Check food labels. Look for the words "trans fat" and "saturated fat."  When preparing a meal: ? Fill half of your plate with vegetables and green salads. ? Fill one fourth of your plate with whole grains. Look for the word "whole" as the first word in the ingredient list. ? Fill one fourth of your plate with lean protein foods.  Eat more foods that have fiber, like apples, carrots, beans, peas, and barley.  Eat more home-cooked foods. Eat less at restaurants and buffets.  Limit or avoid alcohol.  Limit foods high in starch and sugar.  Limit fried foods.  Cook foods without frying them. Baking, boiling, grilling, and broiling are all great options.  Lose weight if  you are overweight. Losing even a small amount of weight can help your overall health. It can also help prevent diseases such as diabetes and heart disease. What foods can I eat? Grains Whole grains, such as whole wheat or whole grain breads, crackers, cereals, and pasta. Unsweetened oatmeal, bulgur, barley, quinoa, or brown rice. Corn or whole wheat flour tortillas. Vegetables Fresh or frozen vegetables (raw, steamed, roasted, or grilled). Green salads. Fruits All fresh, canned (in natural juice), or frozen fruits. Meat and Other Protein Products Ground beef (85% or leaner), grass-fed beef, or beef trimmed of fat. Skinless chicken or [place]. Ground chicken or [place]. Pork trimmed of fat. All fish and seafood. Eggs. Dried beans, peas, or lentils. Unsalted nuts or seeds. Unsalted canned or dry beans. Dairy Low-fat dairy products, such as skim or 1% milk, 2% or reduced-fat cheeses, low-fat ricotta or  cottage cheese, or plain low-fat yogurt. Fats and Oils Tub margarines without trans fats. Light or reduced-fat mayonnaise and salad dressings. Avocado. Olive, canola, sesame, or safflower oils. Natural peanut or almond butter (choose ones without added sugar and oil). The items listed above may not be a complete list of recommended foods or beverages. Contact your dietitian for more options. What foods are not recommended? Grains White bread. White pasta. White rice. Cornbread. Bagels, pastries, and croissants. Crackers that contain trans fat. Vegetables White potatoes. Corn. Creamed or fried vegetables. Vegetables in a cheese sauce. Fruits Dried fruits. Canned fruit in light or heavy syrup. Fruit juice. Meat and Other Protein Products Fatty cuts of meat. Ribs, chicken wings, bacon, sausage, bologna, salami, chitterlings, fatback, hot dogs, bratwurst, and packaged luncheon meats. Liver and organ meats. Dairy Whole or 2% milk, cream, half-and-half, and cream cheese. Whole milk cheeses. Whole-fat or sweetened yogurt. Full-fat cheeses. Nondairy creamers and whipped toppings. Processed cheese, cheese spreads, or cheese curds. Sweets and Desserts Corn syrup, sugars, honey, and molasses. Candy. Jam and jelly. Syrup. Sweetened cereals. Cookies, pies, cakes, donuts, muffins, and ice cream. Fats and Oils Butter, stick margarine, lard, shortening, ghee, or bacon fat. Coconut, palm kernel, or palm oils. Beverages Alcohol. Sweetened drinks (such as sodas, lemonade, and fruit drinks or punches). The items listed above may not be a complete list of foods and beverages to avoid. Contact your dietitian for more information. This information is not intended to replace advice given to you by your health care provider. Make sure you discuss any questions you have with your health care provider. Document Released: 03/12/2012 Document Revised: 05/18/2016 Document Reviewed: 12/11/2013 Elsevier Interactive Patient  Education  [employer].

## 2018-04-30 ENCOUNTER — Encounter: Payer: Self-pay | Admitting: Internal Medicine

## 2018-04-30 ENCOUNTER — Other Ambulatory Visit (INDEPENDENT_AMBULATORY_CARE_PROVIDER_SITE_OTHER): Payer: Medicare Other

## 2018-04-30 ENCOUNTER — Ambulatory Visit (INDEPENDENT_AMBULATORY_CARE_PROVIDER_SITE_OTHER): Payer: Medicare Other | Admitting: Internal Medicine

## 2018-04-30 VITALS — BP 114/68 | HR 75 | Temp 98.3°F | Resp 16 | Wt 199.0 lb

## 2018-04-30 DIAGNOSIS — D649 Anemia, unspecified: Secondary | ICD-10-CM

## 2018-04-30 DIAGNOSIS — R5383 Other fatigue: Secondary | ICD-10-CM

## 2018-04-30 DIAGNOSIS — I10 Essential (primary) hypertension: Secondary | ICD-10-CM

## 2018-04-30 DIAGNOSIS — E7849 Other hyperlipidemia: Secondary | ICD-10-CM

## 2018-04-30 LAB — CBC WITH DIFFERENTIAL/PLATELET
BASOS ABS: 0 10*3/uL (ref 0.0–0.1)
Basophils Relative: 0.4 % (ref 0.0–3.0)
Eosinophils Absolute: 0.2 10*3/uL (ref 0.0–0.7)
Eosinophils Relative: 3.7 % (ref 0.0–5.0)
HEMATOCRIT: 35.5 % — AB (ref 39.0–52.0)
HEMOGLOBIN: 11.5 g/dL — AB (ref 13.0–17.0)
LYMPHS PCT: 46.3 % — AB (ref 12.0–46.0)
Lymphs Abs: 3.1 10*3/uL (ref 0.7–4.0)
MCHC: 32.4 g/dL (ref 30.0–36.0)
MCV: 86.4 fl (ref 78.0–100.0)
MONOS PCT: 10.9 % (ref 3.0–12.0)
Monocytes Absolute: 0.7 10*3/uL (ref 0.1–1.0)
NEUTROS ABS: 2.6 10*3/uL (ref 1.4–7.7)
Neutrophils Relative %: 38.7 % — ABNORMAL LOW (ref 43.0–77.0)
PLATELETS: 249 10*3/uL (ref 150.0–400.0)
RBC: 4.11 Mil/uL — ABNORMAL LOW (ref 4.22–5.81)
RDW: 13.5 % (ref 11.5–15.5)
WBC: 6.7 10*3/uL (ref 4.0–10.5)

## 2018-04-30 LAB — LIPID PANEL
CHOL/HDL RATIO: 4
Cholesterol: 117 mg/dL (ref 0–200)
HDL: 32 mg/dL — AB (ref >39.00)
LDL CALC: 57 mg/dL (ref 0–99)
NONHDL: 84.62
Triglycerides: 137 mg/dL (ref 0.0–149.0)
VLDL: 27.4 mg/dL (ref 0.0–40.0)

## 2018-04-30 LAB — COMPREHENSIVE METABOLIC PANEL
ALT: 24 U/L (ref 0–53)
AST: 33 U/L (ref 0–37)
Albumin: 4.1 g/dL (ref 3.5–5.2)
Alkaline Phosphatase: 77 U/L (ref 39–117)
BUN: 12 mg/dL (ref 6–23)
CALCIUM: 9.6 mg/dL (ref 8.4–10.5)
CHLORIDE: 105 meq/L (ref 96–112)
CO2: 29 meq/L (ref 19–32)
Creatinine, Ser: 0.91 mg/dL (ref 0.40–1.50)
GFR: 106.52 mL/min (ref >60.00)
GLUCOSE: 86 mg/dL (ref 70–99)
POTASSIUM: 3.3 meq/L — AB (ref 3.5–5.1)
Sodium: 141 mEq/L (ref 135–145)
Total Bilirubin: 0.6 mg/dL (ref 0.2–1.2)
Total Protein: 8 g/dL (ref 6.0–8.3)

## 2018-04-30 LAB — TSH: TSH: 1.09 u[IU]/mL (ref 0.35–4.50)

## 2018-04-30 LAB — FERRITIN: FERRITIN: 7.2 ng/mL — AB (ref 22.0–322.0)

## 2018-04-30 LAB — IRON: Iron: 91 ug/dL (ref 42–165)

## 2018-04-30 NOTE — Progress Notes (Signed)
Subjective:    Patient ID: Todd Mendoza, male    DOB: 1951-06-27, 67 y.o.   MRN: [id number]  HPI The patient is here for follow up.  Hypertension: He is taking his medication daily. He is compliant with a low sodium diet.  He denies chest pain, palpitations, edema, shortness of breath and regular headaches. He is exercising regularly.  He does not monitor his blood pressure at home.    Hyperlipidemia: He is taking his medication daily. He is compliant with a low fat/cholesterol diet. He is exercising regularly. He denies myalgias.   Anemia: His blood work 6 months ago showed mild anemia.  He denies any abnormal bruising or bleeding. He has a little fatigue.  He is overdue for colonoscopy.  Medications and allergies reviewed with patient and updated if appropriate.  Patient Active Problem List   Diagnosis Date Noted  . Anemia 04/30/2018  . ED (erectile dysfunction) 11/03/2016  . Lumbar radiculopathy 06/30/2016  . Family history of diabetes mellitus 03/14/2016  . Encounter for prostate cancer screening 03/14/2016  . H/O: CVA (cerebrovascular accident) 12/26/2012  . HTN (hypertension) 12/26/2012  . HLD (hyperlipidemia) 12/26/2012  . Allergic rhinitis 12/26/2012    Current Outpatient Medications on File Prior to Visit  Medication Sig Dispense Refill  . aspirin EC 81 MG tablet Take 1 tablet (81 mg total) by mouth daily. 30 tablet 0  . atorvastatin (LIPITOR) 20 MG tablet TAKE 1 TABLET BY MOUTH DAILY 90 tablet 2  . hydrochlorothiazide (HYDRODIURIL) 25 MG tablet TAKE 1 TABLET BY MOUTH EVERY DAY 30 tablet 5  . ibuprofen (ADVIL,MOTRIN) 200 MG tablet Take 200 mg by mouth every 6 (six) hours as needed.     No current facility-administered medications on file prior to visit.     Past Medical History:  Diagnosis Date  . Hyperlipidemia   . Stroke ([place])   . Vertigo     Past Surgical History:  Procedure Laterality Date  . NO PAST SURGERIES      Social History   Socioeconomic  History  . Marital status: Married    Spouse name: Not on file  . Number of children: Not on file  . Years of education: Not on file  . Highest education level: Not on file  Occupational History  . Not on file  Social Needs  . Financial resource strain: Not hard at all  . Food insecurity:    Worry: Never true    Inability: Never true  . Transportation needs:    Medical: No    Non-medical: No  Tobacco Use  . Smoking status: Former Smoker    Packs/day: 0.20    Types: Cigarettes  . Smokeless tobacco: Never Used  Substance and Sexual Activity  . Alcohol use: No    Alcohol/week: 0.0 oz  . Drug use: No  . Sexual activity: Yes  Lifestyle  . Physical activity:    Days per week: 5 days    Minutes per session: 50 min  . Stress: Not at all  Relationships  . Social connections:    Talks on phone: More than three times a week    Gets together: More than three times a week    Attends religious service: More than 4 times per year    Active member of club or organization: Yes    Attends meetings of clubs or organizations: More than 4 times per year    Relationship status: Married  Other Topics Concern  . Not on file  Social History Narrative   Married   Psychologist, counselling daily    Family History  Problem Relation Age of Onset  . Diabetes Father   . Colon cancer Neg Hx     Review of Systems  Constitutional: Positive for fatigue. Negative for appetite change, chills and fever.  Respiratory: Positive for cough (occasional, dry). Negative for shortness of breath and wheezing.   Cardiovascular: Negative for chest pain, palpitations and leg swelling.  Gastrointestinal: Negative for abdominal pain, blood in stool and nausea.  Neurological: Negative for light-headedness and headaches.       Objective:   Vitals:   04/30/18 1521  BP: 114/68  Pulse: 75  Resp: 16  Temp: 98.3 F (36.8 C)  SpO2: 96%   BP Readings from Last 3 Encounters:  04/30/18 114/68  03/19/18 (!) 152/83  01/08/18  (!) 144/84   Wt Readings from Last 3 Encounters:  04/30/18 199 lb (90.3 kg)  03/19/18 195 lb (88.5 kg)  01/08/18 204 lb 6.4 oz (92.7 kg)   Body mass index is 30.26 kg/m.   Physical Exam    Constitutional: Appears well-developed and well-nourished. No distress.  HENT:  Head: Normocephalic and atraumatic.  Neck: Neck supple. No tracheal deviation present. No thyromegaly present.  No cervical lymphadenopathy Cardiovascular: Normal rate, regular rhythm and normal heart sounds.   No murmur heard. No carotid bruit .  No edema Pulmonary/Chest: Effort normal and breath sounds normal. No respiratory distress. No has no wheezes. No rales.  Skin: Skin is warm and dry. Not diaphoretic.  Psychiatric: Normal mood and affect. Behavior is normal.      Assessment & Plan:    See Problem List for Assessment and Plan of chronic medical problems.

## 2018-04-30 NOTE — Assessment & Plan Note (Signed)
Mild and not daily Cbc, iron levels, tsh Continue regular exercise Working on weight loss - he thinks the weight gain is causing the fatigue

## 2018-04-30 NOTE — Assessment & Plan Note (Signed)
BP well controlled Current regimen effective and well tolerated Continue current medications at current doses cmp  

## 2018-04-30 NOTE — Patient Instructions (Addendum)
  Test(s) ordered today. Your results will be released to MyChart (or called to you) after review, usually within 72hours after test completion. If any changes need to be made, you will be notified at that same time.  Medications reviewed and updated.  No changes recommended at this time.    Please followup in 6 months   

## 2018-04-30 NOTE — Assessment & Plan Note (Signed)
Check lipid panel, cmp Continue daily statin Regular exercise and healthy diet encouraged  

## 2018-04-30 NOTE — Assessment & Plan Note (Signed)
Last blood work showed mild anemia No evidence of bleeding or abnormal bruising Check CBC, iron, ferritin Overdue for colonoscopy

## 2018-05-09 ENCOUNTER — Telehealth: Payer: Self-pay | Admitting: *Deleted

## 2018-05-09 DIAGNOSIS — D649 Anemia, unspecified: Secondary | ICD-10-CM

## 2018-05-09 DIAGNOSIS — E876 Hypokalemia: Secondary | ICD-10-CM

## 2018-05-09 NOTE — Telephone Encounter (Signed)
Notes recorded by Cresenciano Lick, CMA on 05/09/2018 at 10:29 AM EDT Pt's wife informed of below. Labs ordered. ------  Notes recorded by Johney Frame, Adelina Mings, [place] on 05/03/2018 at 3:03 PM EDT LVM for pt to call back as soon as possible.   ------  Notes recorded by Binnie Rail, MD on 05/02/2018 at 5:26 PM EDT Cholesterol is very good. Kidney and liver tests normal. Thyroid function normal. He has mild anemia and slightly low iron and potassium. Lets recheck labs in 1 month. (iron, ferritin, cbc and bmp)

## 2018-06-03 ENCOUNTER — Other Ambulatory Visit (INDEPENDENT_AMBULATORY_CARE_PROVIDER_SITE_OTHER): Payer: Medicare Other

## 2018-06-03 ENCOUNTER — Other Ambulatory Visit: Payer: Self-pay | Admitting: Internal Medicine

## 2018-06-03 DIAGNOSIS — D649 Anemia, unspecified: Secondary | ICD-10-CM | POA: Diagnosis not present

## 2018-06-03 DIAGNOSIS — E876 Hypokalemia: Secondary | ICD-10-CM | POA: Diagnosis not present

## 2018-06-03 LAB — COMPREHENSIVE METABOLIC PANEL
ALT: 24 U/L (ref 0–53)
AST: 31 U/L (ref 0–37)
Albumin: 3.9 g/dL (ref 3.5–5.2)
Alkaline Phosphatase: 77 U/L (ref 39–117)
BILIRUBIN TOTAL: 0.5 mg/dL (ref 0.2–1.2)
BUN: 17 mg/dL (ref 6–23)
CALCIUM: 9.3 mg/dL (ref 8.4–10.5)
CO2: 28 meq/L (ref 19–32)
CREATININE: 1.02 mg/dL (ref 0.40–1.50)
Chloride: 105 mEq/L (ref 96–112)
GFR: 93.35 mL/min (ref >60.00)
Glucose, Bld: 78 mg/dL (ref 70–99)
Potassium: 3.4 mEq/L — ABNORMAL LOW (ref 3.5–5.1)
Sodium: 140 mEq/L (ref 135–145)
Total Protein: 7.8 g/dL (ref 6.0–8.3)

## 2018-06-03 LAB — CBC WITH DIFFERENTIAL/PLATELET
BASOS ABS: 0 10*3/uL (ref 0.0–0.1)
Basophils Relative: 0.6 % (ref 0.0–3.0)
EOS ABS: 0.2 10*3/uL (ref 0.0–0.7)
Eosinophils Relative: 3.4 % (ref 0.0–5.0)
HEMATOCRIT: 35.2 % — AB (ref 39.0–52.0)
Hemoglobin: 11.4 g/dL — ABNORMAL LOW (ref 13.0–17.0)
LYMPHS ABS: 2.6 10*3/uL (ref 0.7–4.0)
LYMPHS PCT: 51.5 % — AB (ref 12.0–46.0)
MCHC: 32.5 g/dL (ref 30.0–36.0)
MCV: 85.9 fl (ref 78.0–100.0)
Monocytes Absolute: 0.5 10*3/uL (ref 0.1–1.0)
Monocytes Relative: 10.3 % (ref 3.0–12.0)
NEUTROS PCT: 34.2 % — AB (ref 43.0–77.0)
Neutro Abs: 1.8 10*3/uL (ref 1.4–7.7)
PLATELETS: 218 10*3/uL (ref 150.0–400.0)
RBC: 4.09 Mil/uL — ABNORMAL LOW (ref 4.22–5.81)
RDW: 14 % (ref 11.5–15.5)
WBC: 5.1 10*3/uL (ref 4.0–10.5)

## 2018-06-03 LAB — FERRITIN: FERRITIN: 10.4 ng/mL — AB (ref 22.0–322.0)

## 2018-06-03 LAB — IRON: Iron: 86 ug/dL (ref 42–165)

## 2018-06-07 ENCOUNTER — Other Ambulatory Visit: Payer: Self-pay | Admitting: Internal Medicine

## 2018-06-07 DIAGNOSIS — Z1211 Encounter for screening for malignant neoplasm of colon: Secondary | ICD-10-CM

## 2018-06-20 ENCOUNTER — Encounter: Payer: Self-pay | Admitting: Gastroenterology

## 2018-07-17 ENCOUNTER — Ambulatory Visit (AMBULATORY_SURGERY_CENTER): Payer: Self-pay | Admitting: *Deleted

## 2018-07-17 ENCOUNTER — Encounter: Payer: Self-pay | Admitting: Gastroenterology

## 2018-07-17 VITALS — Ht 71.0 in | Wt 206.0 lb

## 2018-07-17 DIAGNOSIS — Z1211 Encounter for screening for malignant neoplasm of colon: Secondary | ICD-10-CM

## 2018-07-17 MED ORDER — PEG 3350-KCL-NA BICARB-NACL 420 G PO SOLR: 4000.0000 mL | Freq: Once | ORAL | 0 refills | Status: AC

## 2018-07-17 NOTE — Progress Notes (Signed)
Wife with patient today during PV. Patient denies any allergies to eggs or soy. Patient denies any past surgeries. Patient denies any oxygen use at home. Patient denies taking any diet/weight loss medications or blood thinners. EMMI education offered, pt declined.

## 2018-07-26 ENCOUNTER — Telehealth: Payer: Self-pay | Admitting: Gastroenterology

## 2018-07-26 NOTE — Telephone Encounter (Signed)
Spoke with patient. He states he has had 3 BM's today and each time he has seen blood on the stool, "red" blood. Pt denies blood clots and large amts of blood. Patient denies constipation or diarrhea, he states he has had 3 regular stools but he does not normally go this often. Encouraged patient to call me back today if this happens again and to call our office tomorrow if this continues to happen, we need to know this. Pt verbalizes understanding.

## 2018-07-26 NOTE — Telephone Encounter (Signed)
Called pt. No answer on wife cell #.  Left message given Dr.Gessner's recommendations.

## 2018-07-26 NOTE — Telephone Encounter (Signed)
Called wife. Left message for a return before 5 pm

## 2018-07-26 NOTE — Telephone Encounter (Signed)
Sounds like anorectal bleeding but if worsens or other ? He should call back If severe then should go to ED

## 2018-07-31 ENCOUNTER — Encounter: Payer: Self-pay | Admitting: Gastroenterology

## 2018-07-31 ENCOUNTER — Ambulatory Visit (AMBULATORY_SURGERY_CENTER): Payer: Medicare Other | Admitting: Gastroenterology

## 2018-07-31 VITALS — BP 103/79 | HR 57 | Temp 97.3°F | Resp 8 | Ht 71.0 in | Wt 206.0 lb

## 2018-07-31 DIAGNOSIS — Z1211 Encounter for screening for malignant neoplasm of colon: Secondary | ICD-10-CM | POA: Diagnosis not present

## 2018-07-31 DIAGNOSIS — D122 Benign neoplasm of ascending colon: Secondary | ICD-10-CM | POA: Diagnosis not present

## 2018-07-31 DIAGNOSIS — D123 Benign neoplasm of transverse colon: Secondary | ICD-10-CM | POA: Diagnosis not present

## 2018-07-31 DIAGNOSIS — D128 Benign neoplasm of rectum: Secondary | ICD-10-CM | POA: Diagnosis not present

## 2018-07-31 MED ORDER — SODIUM CHLORIDE 0.9 % IV SOLN: 500.0000 mL | Freq: Once | INTRAVENOUS | Status: DC

## 2018-07-31 NOTE — Progress Notes (Signed)
Called to room to assist during endoscopic procedure.  Patient ID and intended procedure confirmed with present staff. Received instructions for my participation in the procedure from the performing physician.  

## 2018-07-31 NOTE — Progress Notes (Signed)
PT taken to PACU. Monitors in place. VSS. Report given to RN. 

## 2018-07-31 NOTE — Patient Instructions (Signed)
Thank you for allowing [place] to care for you today!  Await the pathology results by mail, approximately 2 weeks.  Next colonoscopy will be determined at that time.   Resume previous diet and medications.  Return to normal activities tomorrow.  Start a high fiber diet.  Handout provided.  Begin fiber supplements   1-2 times per day for optimal results.      YOU HAD AN ENDOSCOPIC PROCEDURE TODAY AT [facility]:   Refer to the procedure report that was given to you for any specific questions about what was found during the examination.  If the procedure report does not answer your questions, please call your gastroenterologist to clarify.  If you requested that your care partner not be given the details of your procedure findings, then the procedure report has been included in a sealed envelope for you to review at your convenience later.  YOU SHOULD EXPECT: Some feelings of bloating in the abdomen. Passage of more gas than usual.  Walking can help get rid of the air that was put into your GI tract during the procedure and reduce the bloating. If you had a lower endoscopy (such as a colonoscopy or flexible sigmoidoscopy) you may notice spotting of blood in your stool or on the toilet paper. If you underwent a bowel prep for your procedure, you may not have a normal bowel movement for a few days.  Please Note:  You might notice some irritation and congestion in your nose or some drainage.  This is from the oxygen used during your procedure.  There is no need for concern and it should clear up in a day or so.  SYMPTOMS TO REPORT IMMEDIATELY:   Following lower endoscopy (colonoscopy or flexible sigmoidoscopy):  Excessive amounts of blood in the stool  Significant tenderness or worsening of abdominal pains  Swelling of the abdomen that is new, acute  Fever of 100F or higher   For urgent or emergent issues, a gastroenterologist can be reached at any hour by calling [phone]  [phone].   DIET:  We do recommend a small meal at first, but then you may proceed to your regular diet.  Drink plenty of fluids but you should avoid alcoholic beverages for 24 hours.  ACTIVITY:  You should plan to take it easy for the rest of today and you should NOT DRIVE or use heavy machinery until tomorrow (because of the sedation medicines used during the test).    FOLLOW UP: Our staff will call the number listed on your records the next business day following your procedure to check on you and address any questions or concerns that you may have regarding the information given to you following your procedure. If we do not reach you, we will leave a message.  However, if you are feeling well and you are not experiencing any problems, there is no need to return our call.  We will assume that you have returned to your regular daily activities without incident.  If any biopsies were taken you will be contacted by phone or by letter within the next 1-3 weeks.  Please call us at [phone] if you have not heard about the biopsies in 3 weeks.    SIGNATURES/CONFIDENTIALITY: You and/or your care partner have signed paperwork which will be entered into your electronic medical record.  These signatures attest to the fact that that the information above on your After Visit Summary has been reviewed and is understood.  Full responsibility  of the confidentiality of this discharge information lies with you and/or your care-partner.

## 2018-07-31 NOTE — Progress Notes (Signed)
Pt did not have a licensed driver with him today.  Was going to attempt to drive self home today.  He admitted he was going to try to drive after telling us he had a driver. Pt agreed to take taxi home today and will retrieve his car tomorrow.  Kept pt in recovery area until taxi arrived.  [employer])  Pt taken out in wheelchair, wife accompanied.

## 2018-07-31 NOTE — Progress Notes (Signed)
Pt's states no medical or surgical changes since previsit or office visit. 

## 2018-07-31 NOTE — Op Note (Signed)
[facility] Patient Name: Todd Mendoza Procedure Date: 07/31/2018 8:40 AM MRN: [id number] Endoscopist: Justice Britain , MD Age: 67 Referring MD:  Date of Birth: 03-14-1951 Gender: Male Account #: [id number] Procedure:                Colonoscopy Indications:              Screening for colorectal malignant neoplasm Medicines:                Monitored Anesthesia Care Procedure:                Pre-Anesthesia Assessment:                           - Prior to the procedure, a History and Physical                            was performed, and patient medications and                            allergies were reviewed. The patient's tolerance of                            previous anesthesia was also reviewed. The risks                            and benefits of the procedure and the sedation                            options and risks were discussed with the patient.                            All questions were answered, and informed consent                            was obtained. Prior Anticoagulants: The patient has                            taken no previous anticoagulant or antiplatelet                            agents. ASA Grade Assessment: II - A patient with                            mild systemic disease. After reviewing the risks                            and benefits, the patient was deemed in                            satisfactory condition to undergo the procedure.                           After obtaining informed consent, the colonoscope  was passed under direct vision. Throughout the                            procedure, the patient's blood pressure, pulse, and                            oxygen saturations were monitored continuously. The                            Model CF-HQ190L [id number]) scope was introduced                            through the anus and advanced to the 5 cm into the                            ileum. The  colonoscopy was performed without                            difficulty. The patient tolerated the procedure.                            The quality of the bowel preparation was evaluated                            using the BBPS [facility] Bowel Preparation Scale)                            with scores of: Right Colon = 3, Transverse Colon =                            3 and Left Colon = 3 (entire mucosa seen well with                            no residual staining, small fragments of stool or                            opaque liquid). The total BBPS score equals 9. Scope In: 9:02:35 AM Scope Out: 9:19:30 AM Scope Withdrawal Time: 0 hours 13 minutes 28 seconds  Total Procedure Duration: 0 hours 16 minutes 55 seconds  Findings:                 The digital rectal exam findings include                            non-thrombosed internal hemorrhoids. Pertinent                            negatives include no palpable rectal lesions.                           The terminal ileum and ileocecal valve appeared  normal.                           Three sessile polyps were found in the rectum,                            transverse colon and ascending colon. The polyps                            were 1 to 5 mm in size. These polyps were removed                            with a cold snare. Resection and retrieval were                            complete.                           Multiple small-mouthed diverticula were found in                            the recto-sigmoid colon and sigmoid colon.                           Normal mucosa was found in the entire colon                            otherwise.                           Non-bleeding non-thrombosed internal hemorrhoids                            were found during retroflexion, during perianal                            exam and during digital exam. The hemorrhoids were                            Grade II (internal  hemorrhoids that prolapse but                            reduce spontaneously). Complications:            No immediate complications. Estimated Blood Loss:     Estimated blood loss was minimal. Impression:               - Non-thrombosed internal hemorrhoids found on                            digital rectal exam.                           - The examined portion of the ileum was normal.                           - Three  1 to 5 mm polyps in the rectum, in the                            transverse colon and in the ascending colon,                            removed with a cold snare. Resected and retrieved.                           - Diverticulosis in the recto-sigmoid colon and in                            the sigmoid colon.                           - Normal mucosa in the entire examined colon                            otherwise.                           - Non-bleeding non-thrombosed internal hemorrhoids. Recommendation:           - The patient will be observed post-procedure,                            until all discharge criteria are met.                           - Discharge patient to home.                           - Patient has a contact number available for                            emergencies. The signs and symptoms of potential                            delayed complications were discussed with the                            patient. Return to normal activities tomorrow.                            Written discharge instructions were provided to the                            patient.                           - High fiber diet.                           - Begin Fiber supplementation 1-2 times daily to  optimize stool caliber and decrease straining.                           - Repeat colonoscopy in 3 - 5 years for                            surveillance based on pathology results nand                            findings of adenomatous tissue.                            - The findings and recommendations were discussed                            with the patient.                           - The findings and recommendations were discussed                            with the patient's family. Justice Britain, MD 07/31/2018 9:26:25 AM

## 2018-08-01 ENCOUNTER — Telehealth: Payer: Self-pay

## 2018-08-01 NOTE — Telephone Encounter (Signed)
  Follow up Call-  Call back number 07/31/2018  Post procedure Call Back phone  # [phone]  Permission to leave phone message Yes  Some recent data might be hidden     Patient questions:  Do you have a fever, pain , or abdominal swelling? No. Pain Score  0 *  Have you tolerated food without any problems? Yes.    Have you been able to return to your normal activities? Yes.    Do you have any questions about your discharge instructions: Diet   No. Medications  No. Follow up visit  No.  Do you have questions or concerns about your Care? No.  Actions: * If pain score is 4 or above: No action needed, pain <4.

## 2018-08-05 ENCOUNTER — Encounter: Payer: Self-pay | Admitting: Gastroenterology

## 2018-09-05 ENCOUNTER — Encounter (HOSPITAL_COMMUNITY): Payer: Self-pay | Admitting: Emergency Medicine

## 2018-09-05 ENCOUNTER — Other Ambulatory Visit: Payer: Self-pay

## 2018-09-05 ENCOUNTER — Emergency Department (HOSPITAL_COMMUNITY): Payer: Medicare Other

## 2018-09-05 ENCOUNTER — Emergency Department (HOSPITAL_COMMUNITY)
Admission: EM | Admit: 2018-09-05 | Discharge: 2018-09-05 | Disposition: A | Discharge status: Home / Self Care | Payer: Medicare Other | Attending: Emergency Medicine | Admitting: Emergency Medicine

## 2018-09-05 DIAGNOSIS — I10 Essential (primary) hypertension: Secondary | ICD-10-CM | POA: Insufficient documentation

## 2018-09-05 DIAGNOSIS — Z79899 Other long term (current) drug therapy: Secondary | ICD-10-CM | POA: Diagnosis not present

## 2018-09-05 DIAGNOSIS — Z7982 Long term (current) use of aspirin: Secondary | ICD-10-CM | POA: Insufficient documentation

## 2018-09-05 DIAGNOSIS — Z87891 Personal history of nicotine dependence: Secondary | ICD-10-CM | POA: Insufficient documentation

## 2018-09-05 DIAGNOSIS — R42 Dizziness and giddiness: Secondary | ICD-10-CM | POA: Diagnosis present

## 2018-09-05 DIAGNOSIS — R05 Cough: Secondary | ICD-10-CM | POA: Diagnosis not present

## 2018-09-05 DIAGNOSIS — B349 Viral infection, unspecified: Secondary | ICD-10-CM

## 2018-09-05 DIAGNOSIS — Z8673 Personal history of transient ischemic attack (TIA), and cerebral infarction without residual deficits: Secondary | ICD-10-CM | POA: Diagnosis not present

## 2018-09-05 LAB — INFLUENZA PANEL BY PCR (TYPE A & B)
INFLAPCR: NEGATIVE
Influenza B By PCR: POSITIVE — AB

## 2018-09-05 MED ORDER — BENZONATATE 100 MG PO CAPS: 100.0000 mg | ORAL_CAPSULE | Freq: Three times a day (TID) | ORAL | 0 refills | Status: DC

## 2018-09-05 MED ORDER — IBUPROFEN 600 MG PO TABS: 600.0000 mg | ORAL_TABLET | Freq: Four times a day (QID) | ORAL | 0 refills | Status: DC | PRN

## 2018-09-05 NOTE — ED Triage Notes (Signed)
Patient complaining of generalized body aches, coughing, and having pain all over. Patient states it has been going on for three days.

## 2018-09-05 NOTE — ED Provider Notes (Signed)
[facility] DEPT Provider Note   CSN: [id number] Arrival date & time: 09/05/18  0345     History   Chief Complaint Chief Complaint  Patient presents with  . Generalized Body Aches  . Cough    HPI Todd Mendoza is a 67 y.o. male.  The history is provided by the patient. No language interpreter was used.  Cough      67 year old male with history of hypertension, prior stroke, anemia presenting with flulike symptoms.  Patient report for the past 3 days he has had lightheadedness, generalized body aches, chills, throat irritation, cough that is nonproductive and decreased activity level.  Symptom has been persistent, not improve despite using TheraFlu.  His granddaughter was sick with similar symptoms.  He denies fever, severe headache, sneezing, ear pain, nausea vomiting diarrhea abdominal pain or back pain.  He denies tobacco or alcohol abuse.  He has not had his flu shot.  He wife report patient had similar symptoms last year and was diagnosed with pneumonia.  Past Medical History:  Diagnosis Date  . Hyperlipidemia   . Hypertension   . Stroke ([place])   . Vertigo     Patient Active Problem List   Diagnosis Date Noted  . Anemia 04/30/2018  . Fatigue 04/30/2018  . ED (erectile dysfunction) 11/03/2016  . Lumbar radiculopathy 06/30/2016  . Family history of diabetes mellitus 03/14/2016  . Encounter for prostate cancer screening 03/14/2016  . H/O: CVA (cerebrovascular accident) 12/26/2012  . HTN (hypertension) 12/26/2012  . HLD (hyperlipidemia) 12/26/2012  . Allergic rhinitis 12/26/2012    Past Surgical History:  Procedure Laterality Date  . NO PAST SURGERIES          Home Medications    Prior to Admission medications   Medication Sig Start Date End Date Taking? Authorizing Provider  aspirin EC 81 MG tablet Take 1 tablet (81 mg total) by mouth daily. 12/16/12  Yes Sharda, Neema K, MD  atorvastatin (LIPITOR) 20 MG tablet TAKE 1 TABLET  BY MOUTH DAILY 06/03/18  Yes Burns, Claudina Lick, MD  hydrochlorothiazide (HYDRODIURIL) 25 MG tablet TAKE 1 TABLET BY MOUTH EVERY DAY 01/25/18  Yes Burns, Claudina Lick, MD    Family History Family History  Problem Relation Age of Onset  . Diabetes Father   . Colon cancer Neg Hx   . Colon polyps Neg Hx   . Esophageal cancer Neg Hx   . Stomach cancer Neg Hx   . Rectal cancer Neg Hx     Social History Social History   Tobacco Use  . Smoking status: Former Smoker    Packs/day: 0.20    Types: Cigarettes  . Smokeless tobacco: Never Used  Substance Use Topics  . Alcohol use: No    Alcohol/week: 0.0 standard drinks  . Drug use: No     Allergies   Patient has no known allergies.   Review of Systems Review of Systems  Respiratory: Positive for cough.   All other systems reviewed and are negative.    Physical Exam Updated Vital Signs BP 121/89 (BP Location: Right Arm)   Pulse 71   Temp 97.9 F (36.6 C) (Oral)   Resp 20   Ht 5\' 11"  (1.803 m)   Wt 101.6 kg   SpO2 96%   BMI 31.24 kg/m   Physical Exam Vitals signs and nursing note reviewed.  Constitutional:      General: He is not in acute distress.    Appearance: He is well-developed.  HENT:  Head: Atraumatic.     Right Ear: Tympanic membrane normal.     Left Ear: Tympanic membrane normal.     Nose: Nose normal.     Mouth/Throat:     Mouth: Mucous membranes are moist.     Pharynx: No oropharyngeal exudate or posterior oropharyngeal erythema.  Eyes:     Conjunctiva/sclera: Conjunctivae normal.  Neck:     Musculoskeletal: Neck supple.  Cardiovascular:     Rate and Rhythm: Normal rate and regular rhythm.     Pulses: Normal pulses.     Heart sounds: No murmur. No friction rub. No gallop.   Pulmonary:     Effort: Pulmonary effort is normal.     Breath sounds: Normal breath sounds.  Abdominal:     General: Abdomen is flat.     Tenderness: There is no abdominal tenderness.  Musculoskeletal:        General: No  swelling.  Lymphadenopathy:     Cervical: No cervical adenopathy.  Skin:    Findings: No rash.  Neurological:     Mental Status: He is alert.  Psychiatric:        Mood and Affect: Mood normal.      ED Treatments / Results  Labs (all labs ordered are listed, but only abnormal results are displayed) Labs Reviewed  INFLUENZA PANEL BY PCR (TYPE A & B)    EKG None  Radiology Dg Chest 2 View  Result Date: 09/05/2018 CLINICAL DATA:  67 year old male with cough. EXAM: CHEST - 2 VIEW COMPARISON:  Chest radiograph dated 12/16/2012 FINDINGS: There is no focal consolidation, pleural effusion, or pneumothorax. Top-normal cardiac size. The aorta is mildly tortuous. No acute osseous pathology. IMPRESSION: No active cardiopulmonary disease. Electronically Signed   By: Anner Crete M.D.   On: 09/05/2018 06:42    Procedures Procedures (including critical care time)  Medications Ordered in ED Medications - No data to display   Initial Impression / Assessment and Plan / ED Course  I have reviewed the triage vital signs and the nursing notes.  Pertinent labs & imaging results that were available during my care of the patient were reviewed by me and considered in my medical decision making (see chart for details).     BP 121/89 (BP Location: Right Arm)   Pulse 71   Temp 97.9 F (36.6 C) (Oral)   Resp 20   Ht 5\' 11"  (1.803 m)   Wt 101.6 kg   SpO2 96%   BMI 31.24 kg/m    Final Clinical Impressions(s) / ED Diagnoses   Final diagnoses:  Viral illness    ED Discharge Orders         Ordered    benzonatate (TESSALON) 100 MG capsule  Every 8 hours     09/05/18 0749    ibuprofen (ADVIL,MOTRIN) 600 MG tablet  Every 6 hours PRN     09/05/18 0749         6:13 AM Pt symptoms consistent with URI. CXR negative for acute infiltrate. Pt will be discharged with symptomatic treatment.  Discussed return precautions.  Pt is hemodynamically stable & in NAD prior to discharge.  Care  discussed with Dr. Florina Ou.  Flu PCR ordered.     Domenic Moras, PA-C 09/05/18 0751    Shanon Rosser, MD 09/05/18 2255

## 2018-10-07 ENCOUNTER — Other Ambulatory Visit: Payer: Self-pay

## 2018-10-07 ENCOUNTER — Emergency Department (HOSPITAL_COMMUNITY)
Admission: EM | Admit: 2018-10-07 | Discharge: 2018-10-07 | Disposition: A | Discharge status: Home / Self Care | Payer: Medicare Other | Attending: Emergency Medicine | Admitting: Emergency Medicine

## 2018-10-07 ENCOUNTER — Encounter (HOSPITAL_COMMUNITY): Payer: Self-pay | Admitting: Emergency Medicine

## 2018-10-07 DIAGNOSIS — R05 Cough: Secondary | ICD-10-CM | POA: Diagnosis present

## 2018-10-07 DIAGNOSIS — J209 Acute bronchitis, unspecified: Secondary | ICD-10-CM | POA: Insufficient documentation

## 2018-10-07 DIAGNOSIS — Z87891 Personal history of nicotine dependence: Secondary | ICD-10-CM | POA: Diagnosis not present

## 2018-10-07 DIAGNOSIS — Z79899 Other long term (current) drug therapy: Secondary | ICD-10-CM | POA: Insufficient documentation

## 2018-10-07 DIAGNOSIS — I10 Essential (primary) hypertension: Secondary | ICD-10-CM | POA: Diagnosis not present

## 2018-10-07 DIAGNOSIS — Z7982 Long term (current) use of aspirin: Secondary | ICD-10-CM | POA: Diagnosis not present

## 2018-10-07 MED ORDER — DOXYCYCLINE HYCLATE 100 MG PO CAPS: 100.0000 mg | ORAL_CAPSULE | Freq: Two times a day (BID) | ORAL | 0 refills | Status: DC

## 2018-10-07 MED ORDER — ALBUTEROL SULFATE HFA 108 (90 BASE) MCG/ACT IN AERS: 1.0000 | INHALATION_SPRAY | Freq: Four times a day (QID) | RESPIRATORY_TRACT | 0 refills | Status: DC | PRN

## 2018-10-07 NOTE — ED Provider Notes (Signed)
[facility] DEPT Provider Note   CSN: [id number] Arrival date & time: 10/07/18  0645     History   Chief Complaint Chief Complaint  Patient presents with  . Cough    HPI Todd Mendoza is a 68 y.o. male.  HPI Patient is a 68 year old male presents the emergency department with ongoing productive cough and phlegm.  He denies fevers.  He reports of occasional shortness of breath with exertion.  Denies orthopnea.  No unilateral leg swelling.  Reports his cough just does not seem to clear.  He coughs at night.  No weakness.  Eating and drinking normally.  Denies chest pain.  No abdominal pain.  No other complaints.   Past Medical History:  Diagnosis Date  . Hyperlipidemia   . Hypertension   . Stroke ([place])   . Vertigo     Patient Active Problem List   Diagnosis Date Noted  . Anemia 04/30/2018  . Fatigue 04/30/2018  . ED (erectile dysfunction) 11/03/2016  . Lumbar radiculopathy 06/30/2016  . Family history of diabetes mellitus 03/14/2016  . Encounter for prostate cancer screening 03/14/2016  . H/O: CVA (cerebrovascular accident) 12/26/2012  . HTN (hypertension) 12/26/2012  . HLD (hyperlipidemia) 12/26/2012  . Allergic rhinitis 12/26/2012    Past Surgical History:  Procedure Laterality Date  . NO PAST SURGERIES          Home Medications    Prior to Admission medications   Medication Sig Start Date End Date Taking? Authorizing Provider  albuterol (PROVENTIL HFA;VENTOLIN HFA) 108 (90 Base) MCG/ACT inhaler Inhale 1-2 puffs into the lungs every 6 (six) hours as needed for wheezing or shortness of breath. 10/07/18   Jola Schmidt, MD  aspirin EC 81 MG tablet Take 1 tablet (81 mg total) by mouth daily. 12/16/12   Othella Boyer, MD  atorvastatin (LIPITOR) 20 MG tablet TAKE 1 TABLET BY MOUTH DAILY 06/03/18   Burns, Claudina Lick, MD  benzonatate (TESSALON) 100 MG capsule Take 1 capsule (100 mg total) by mouth every 8 (eight) hours. 09/05/18   Domenic Moras, PA-C  doxycycline (VIBRAMYCIN) 100 MG capsule Take 1 capsule (100 mg total) by mouth 2 (two) times daily. 10/07/18   Jola Schmidt, MD  hydrochlorothiazide (HYDRODIURIL) 25 MG tablet TAKE 1 TABLET BY MOUTH EVERY DAY 01/25/18   Binnie Rail, MD  ibuprofen (ADVIL,MOTRIN) 600 MG tablet Take 1 tablet (600 mg total) by mouth every 6 (six) hours as needed. 09/05/18   Domenic Moras, PA-C    Family History Family History  Problem Relation Age of Onset  . Diabetes Father   . Colon cancer Neg Hx   . Colon polyps Neg Hx   . Esophageal cancer Neg Hx   . Stomach cancer Neg Hx   . Rectal cancer Neg Hx     Social History Social History   Tobacco Use  . Smoking status: Former Smoker    Packs/day: 0.20    Types: Cigarettes  . Smokeless tobacco: Never Used  Substance Use Topics  . Alcohol use: No    Alcohol/week: 0.0 standard drinks  . Drug use: No     Allergies   Patient has no known allergies.   Review of Systems Review of Systems  All other systems reviewed and are negative.    Physical Exam Updated Vital Signs BP (!) 145/86 (BP Location: Left Arm)   Pulse 77   Temp 98.6 F (37 C) (Oral)   Resp 18   Ht 5\' 10"  (  1.778 m)   Wt 106.1 kg   SpO2 95%   BMI 33.58 kg/m   Physical Exam Vitals signs and nursing note reviewed.  Constitutional:      Appearance: He is well-developed.  HENT:     Head: Normocephalic and atraumatic.  Neck:     Musculoskeletal: Normal range of motion.  Cardiovascular:     Rate and Rhythm: Normal rate and regular rhythm.     Heart sounds: Normal heart sounds.  Pulmonary:     Effort: Pulmonary effort is normal. No respiratory distress.     Breath sounds: Normal breath sounds. No stridor. No wheezing or rhonchi.  Abdominal:     General: There is no distension.     Palpations: Abdomen is soft.     Tenderness: There is no abdominal tenderness.  Musculoskeletal: Normal range of motion.  Skin:    General: Skin is warm and dry.  Neurological:       Mental Status: He is alert and oriented to person, place, and time.  Psychiatric:        Judgment: Judgment normal.      ED Treatments / Results  Labs (all labs ordered are listed, but only abnormal results are displayed) Labs Reviewed - No data to display  EKG None  Radiology No results found.  Procedures Procedures (including critical care time)  Medications Ordered in ED Medications - No data to display   Initial Impression / Assessment and Plan / ED Course  I have reviewed the triage vital signs and the nursing notes.  Pertinent labs & imaging results that were available during my care of the patient were reviewed by me and considered in my medical decision making (see chart for details).     Well-appearing.  Acute bronchitis.  Vital signs are stable.  No increased work of breathing.  Overall well-appearing.  Sitting in the chair in the emergency department room watching TV without any issues.  Eating and drinking normally.  Close primary care follow-up.  Will prescribe course of antibiotics and bronchodilators.  He understands if this does not clear he will need follow-up with his primary care physician and a chest x-ray  Final Clinical Impressions(s) / ED Diagnoses   Final diagnoses:  Acute bronchitis, unspecified organism    ED Discharge Orders         Ordered    doxycycline (VIBRAMYCIN) 100 MG capsule  2 times daily,   Status:  Discontinued     10/07/18 0743    albuterol (PROVENTIL HFA;VENTOLIN HFA) 108 (90 Base) MCG/ACT inhaler  Every 6 hours PRN     10/07/18 0743    doxycycline (VIBRAMYCIN) 100 MG capsule  2 times daily     10/07/18 0743           Jola Schmidt, MD 10/07/18 [phone]

## 2018-10-07 NOTE — ED Triage Notes (Signed)
Pt reports cough for the last several weeks that has not improved. Pt reports a yellow productive cough.

## 2018-10-09 ENCOUNTER — Other Ambulatory Visit (INDEPENDENT_AMBULATORY_CARE_PROVIDER_SITE_OTHER): Payer: Medicare Other

## 2018-10-09 ENCOUNTER — Ambulatory Visit (INDEPENDENT_AMBULATORY_CARE_PROVIDER_SITE_OTHER): Payer: Medicare Other | Admitting: Internal Medicine

## 2018-10-09 ENCOUNTER — Encounter: Payer: Self-pay | Admitting: Internal Medicine

## 2018-10-09 ENCOUNTER — Other Ambulatory Visit: Payer: Medicare Other

## 2018-10-09 VITALS — BP 136/84 | HR 73 | Temp 98.6°F | Ht 70.0 in | Wt 214.0 lb

## 2018-10-09 DIAGNOSIS — Z8673 Personal history of transient ischemic attack (TIA), and cerebral infarction without residual deficits: Secondary | ICD-10-CM | POA: Diagnosis not present

## 2018-10-09 DIAGNOSIS — D649 Anemia, unspecified: Secondary | ICD-10-CM

## 2018-10-09 DIAGNOSIS — I10 Essential (primary) hypertension: Secondary | ICD-10-CM | POA: Diagnosis not present

## 2018-10-09 DIAGNOSIS — E7849 Other hyperlipidemia: Secondary | ICD-10-CM | POA: Diagnosis not present

## 2018-10-09 LAB — COMPREHENSIVE METABOLIC PANEL
ALT: 31 U/L (ref 0–53)
AST: 35 U/L (ref 0–37)
Albumin: 3.9 g/dL (ref 3.5–5.2)
Alkaline Phosphatase: 81 U/L (ref 39–117)
BUN: 11 mg/dL (ref 6–23)
CHLORIDE: 104 meq/L (ref 96–112)
CO2: 31 meq/L (ref 19–32)
Calcium: 9.7 mg/dL (ref 8.4–10.5)
Creatinine, Ser: 0.92 mg/dL (ref 0.40–1.50)
GFR: 105.05 mL/min (ref >60.00)
Glucose, Bld: 84 mg/dL (ref 70–99)
Potassium: 4.4 mEq/L (ref 3.5–5.1)
Sodium: 139 mEq/L (ref 135–145)
Total Bilirubin: 0.5 mg/dL (ref 0.2–1.2)
Total Protein: 8.1 g/dL (ref 6.0–8.3)

## 2018-10-09 LAB — IRON,TIBC AND FERRITIN PANEL
%SAT: 27 % (calc) (ref 20–48)
Ferritin: 25 ng/mL (ref 24–380)
Iron: 93 ug/dL (ref 50–180)
TIBC: 339 mcg/dL (calc) (ref 250–425)

## 2018-10-09 LAB — LIPID PANEL
Cholesterol: 124 mg/dL (ref 0–200)
HDL: 30.1 mg/dL — ABNORMAL LOW (ref >39.00)
NonHDL: 94.3
Total CHOL/HDL Ratio: 4
Triglycerides: 268 mg/dL — ABNORMAL HIGH (ref 0.0–149.0)
VLDL: 53.6 mg/dL — ABNORMAL HIGH (ref 0.0–40.0)

## 2018-10-09 LAB — CBC WITH DIFFERENTIAL/PLATELET
Basophils Absolute: 0.1 10*3/uL (ref 0.0–0.1)
Basophils Relative: 0.7 % (ref 0.0–3.0)
Eosinophils Absolute: 0.4 10*3/uL (ref 0.0–0.7)
Eosinophils Relative: 4.7 % (ref 0.0–5.0)
HEMATOCRIT: 38.7 % — AB (ref 39.0–52.0)
Hemoglobin: 12.4 g/dL — ABNORMAL LOW (ref 13.0–17.0)
Lymphocytes Relative: 42.5 % (ref 12.0–46.0)
Lymphs Abs: 3.5 10*3/uL (ref 0.7–4.0)
MCHC: 32 g/dL (ref 30.0–36.0)
MCV: 88.4 fl (ref 78.0–100.0)
Monocytes Absolute: 0.9 10*3/uL (ref 0.1–1.0)
Monocytes Relative: 10.6 % (ref 3.0–12.0)
Neutro Abs: 3.4 10*3/uL (ref 1.4–7.7)
Neutrophils Relative %: 41.5 % — ABNORMAL LOW (ref 43.0–77.0)
Platelets: 256 10*3/uL (ref 150.0–400.0)
RBC: 4.38 Mil/uL (ref 4.22–5.81)
RDW: 14.2 % (ref 11.5–15.5)
WBC: 8.3 10*3/uL (ref 4.0–10.5)

## 2018-10-09 LAB — LDL CHOLESTEROL, DIRECT: Direct LDL: 62 mg/dL

## 2018-10-09 MED ORDER — HYDROCHLOROTHIAZIDE 25 MG PO TABS: 25.0000 mg | ORAL_TABLET | Freq: Every day | ORAL | 5 refills | Status: DC

## 2018-10-09 NOTE — Assessment & Plan Note (Addendum)
Colonoscopy done 07/2018 Ck cbc, iron panel

## 2018-10-09 NOTE — Assessment & Plan Note (Signed)
Continue statin, lipids Stressed weight loss, start exercise Low sodium diet Continue current meds

## 2018-10-09 NOTE — Patient Instructions (Addendum)

## 2018-10-09 NOTE — Progress Notes (Signed)
Subjective:    Patient ID: Todd Mendoza, male    DOB: 1951/07/09, 68 y.o.   MRN: [id number]  HPI The patient is here for follow up.  Hypertension: He is taking his medication daily. He is not compliant with a low sodium diet.  He denies chest pain, palpitations, edema, shortness of breath and regular headaches. He is not exercising regularly.  He does not monitor his blood pressure at home.    Hyperlipidemia: He is taking his medication daily. He is compliant with a low fat/cholesterol diet. He is not exercising regularly. He denies myalgias.   H/o CVA:  He is taking his aspirin, statin and BP medication daily.    He has gained weight since he was here last.   He is not exercising regularly.  He wonders if he can be prescribed something to help with weight loss.    Medications and allergies reviewed with patient and updated if appropriate.  Patient Active Problem List   Diagnosis Date Noted  . Anemia 04/30/2018  . Fatigue 04/30/2018  . ED (erectile dysfunction) 11/03/2016  . Lumbar radiculopathy 06/30/2016  . Family history of diabetes mellitus 03/14/2016  . Encounter for prostate cancer screening 03/14/2016  . H/O: CVA (cerebrovascular accident) 12/26/2012  . HTN (hypertension) 12/26/2012  . HLD (hyperlipidemia) 12/26/2012  . Allergic rhinitis 12/26/2012    Current Outpatient Medications on File Prior to Visit  Medication Sig Dispense Refill  . albuterol (PROVENTIL HFA;VENTOLIN HFA) 108 (90 Base) MCG/ACT inhaler Inhale 1-2 puffs into the lungs every 6 (six) hours as needed for wheezing or shortness of breath. 1 Inhaler 0  . aspirin EC 81 MG tablet Take 1 tablet (81 mg total) by mouth daily. 30 tablet 0  . atorvastatin (LIPITOR) 20 MG tablet TAKE 1 TABLET BY MOUTH DAILY 90 tablet 2  . hydrochlorothiazide (HYDRODIURIL) 25 MG tablet TAKE 1 TABLET BY MOUTH EVERY DAY 30 tablet 5  . ibuprofen (ADVIL,MOTRIN) 600 MG tablet Take 1 tablet (600 mg total) by mouth every 6 (six) hours as  needed. 30 tablet 0   No current facility-administered medications on file prior to visit.     Past Medical History:  Diagnosis Date  . Hyperlipidemia   . Hypertension   . Stroke ([place])   . Vertigo     Past Surgical History:  Procedure Laterality Date  . NO PAST SURGERIES      Social History   Socioeconomic History  . Marital status: Married    Spouse name: Not on file  . Number of children: Not on file  . Years of education: Not on file  . Highest education level: Not on file  Occupational History  . Not on file  Social Needs  . Financial resource strain: Not hard at all  . Food insecurity:    Worry: Never true    Inability: Never true  . Transportation needs:    Medical: No    Non-medical: No  Tobacco Use  . Smoking status: Former Smoker    Packs/day: 0.20    Types: Cigarettes  . Smokeless tobacco: Never Used  Substance and Sexual Activity  . Alcohol use: No    Alcohol/week: 0.0 standard drinks  . Drug use: No  . Sexual activity: Yes  Lifestyle  . Physical activity:    Days per week: 5 days    Minutes per session: 50 min  . Stress: Not at all  Relationships  . Social connections:    Talks on phone: More than  three times a week    Gets together: More than three times a week    Attends religious service: More than 4 times per year    Active member of club or organization: Yes    Attends meetings of clubs or organizations: More than 4 times per year    Relationship status: Married  Other Topics Concern  . Not on file  Social History Narrative   Married   Walks daily    Family History  Problem Relation Age of Onset  . Diabetes Father   . Colon cancer Neg Hx   . Colon polyps Neg Hx   . Esophageal cancer Neg Hx   . Stomach cancer Neg Hx   . Rectal cancer Neg Hx     Review of Systems  Constitutional: Negative for chills and fever.  Respiratory: Positive for cough (mildly productive). Negative for shortness of breath and wheezing.     Cardiovascular: Negative for chest pain, palpitations and leg swelling.  Neurological: Positive for light-headedness (with standing quickly). Negative for dizziness, weakness, numbness and headaches.       Objective:   Vitals:   10/09/18 1304  BP: 136/84  Pulse: 73  Temp: 98.6 F (37 C)  SpO2: 96%   BP Readings from Last 3 Encounters:  10/09/18 136/84  10/07/18 (!) 145/86  09/05/18 (!) 124/93   Wt Readings from Last 3 Encounters:  10/09/18 214 lb (97.1 kg)  10/07/18 234 lb (106.1 kg)  09/05/18 224 lb (101.6 kg)   Body mass index is 30.71 kg/m.   Physical Exam    Constitutional: Appears well-developed and well-nourished. No distress.  HENT:  Head: Normocephalic and atraumatic.  Neck: Neck supple. No tracheal deviation present. No thyromegaly present.  No cervical lymphadenopathy Cardiovascular: Normal rate, regular rhythm and normal heart sounds.   No murmur heard. No carotid bruit .  No edema Pulmonary/Chest: Effort normal and breath sounds normal. No respiratory distress. No has no wheezes. No rales.  Skin: Skin is warm and dry. Not diaphoretic.  Psychiatric: Normal mood and affect. Behavior is normal.      Assessment & Plan:   Advised no medication for weight loss - he needs to start exercising and eat less  See Problem List for Assessment and Plan of chronic medical problems.

## 2018-10-09 NOTE — Assessment & Plan Note (Signed)
Check lipid panel  Continue daily statin Regular exercise and healthy diet encouraged  

## 2018-10-09 NOTE — Assessment & Plan Note (Signed)
BP well controlled Current regimen effective and well tolerated Continue current medications at current doses cmp  

## 2018-11-19 ENCOUNTER — Ambulatory Visit: Payer: Medicare Other | Admitting: Internal Medicine

## 2019-02-12 ENCOUNTER — Telehealth: Payer: Self-pay | Admitting: Internal Medicine

## 2019-02-12 NOTE — Progress Notes (Signed)
Subjective:    Patient ID: Todd Mendoza, male    DOB: May 29, 1951, 68 y.o.   MRN: [id number]  HPI The patient is here for an acute visit for fatigue, SOB.  His wife called yesterday and was concerned that he had weight gain especially in his abdomen, shortness of breath, little bit of a cough, weakness.  He is here today by himself due to the coronavirus restrictions.  He does not feel that any of his symptoms are concerning or are acute.  He states he was going to the gym until they closed and was eating less sugars.  He had lost weight, but is no longer able to lose weight.  He was concerned because the scale at home varies by a couple of pounds sometimes each week.    He has been doing some walking.  He has noticed that when he walks around the track and he gets SOB.  He thinks the SOB has gotten worse.  He is not overly concerned about this and he still thinks it is related to his weight.  He really does want to try to lose weight and wonders if he should take a medication to help with that.  He has an occasional cough, but nothing regular.  He has not had any wheezing, chest pain or palpitations.  He denies any swelling in his legs or hands.  He states his abdomen gets bloated at times, but typically after eating.  He is not having abdominal distention.  He does feel tired at times.  He does not really sleep well.  Sometimes he wakes up at 1:00 in the morning and cannot get to sleep again until 5:00 in the morning and then is up at 6.  He has not concerned necessarily about any of his symptoms.  Again he thinks it is all related to his weight.  He does not feel that anything is acute.   Medications and allergies reviewed with patient and updated if appropriate.  Patient Active Problem List   Diagnosis Date Noted  . DOE (dyspnea on exertion) 02/13/2019  . Anemia 04/30/2018  . Fatigue 04/30/2018  . ED (erectile dysfunction) 11/03/2016  . Lumbar radiculopathy 06/30/2016  . Family  history of diabetes mellitus 03/14/2016  . Encounter for prostate cancer screening 03/14/2016  . H/O: CVA (cerebrovascular accident) 12/26/2012  . HTN (hypertension) 12/26/2012  . HLD (hyperlipidemia) 12/26/2012  . Allergic rhinitis 12/26/2012    Current Outpatient Medications on File Prior to Visit  Medication Sig Dispense Refill  . albuterol (PROVENTIL HFA;VENTOLIN HFA) 108 (90 Base) MCG/ACT inhaler Inhale 1-2 puffs into the lungs every 6 (six) hours as needed for wheezing or shortness of breath. 1 Inhaler 0  . aspirin EC 81 MG tablet Take 1 tablet (81 mg total) by mouth daily. 30 tablet 0  . atorvastatin (LIPITOR) 20 MG tablet TAKE 1 TABLET BY MOUTH DAILY 90 tablet 2  . hydrochlorothiazide (HYDRODIURIL) 25 MG tablet Take 1 tablet (25 mg total) by mouth daily. 30 tablet 5  . ibuprofen (ADVIL,MOTRIN) 600 MG tablet Take 1 tablet (600 mg total) by mouth every 6 (six) hours as needed. 30 tablet 0   No current facility-administered medications on file prior to visit.     Past Medical History:  Diagnosis Date  . Hyperlipidemia   . Hypertension   . Stroke ([place])   . Vertigo     Past Surgical History:  Procedure Laterality Date  . NO PAST SURGERIES  Social History   Socioeconomic History  . Marital status: Married    Spouse name: Not on file  . Number of children: Not on file  . Years of education: Not on file  . Highest education level: Not on file  Occupational History  . Not on file  Social Needs  . Financial resource strain: Not hard at all  . Food insecurity:    Worry: Never true    Inability: Never true  . Transportation needs:    Medical: No    Non-medical: No  Tobacco Use  . Smoking status: Former Smoker    Packs/day: 0.20    Types: Cigarettes  . Smokeless tobacco: Never Used  Substance and Sexual Activity  . Alcohol use: No    Alcohol/week: 0.0 standard drinks  . Drug use: No  . Sexual activity: Yes  Lifestyle  . Physical activity:    Days per week:  5 days    Minutes per session: 50 min  . Stress: Not at all  Relationships  . Social connections:    Talks on phone: More than three times a week    Gets together: More than three times a week    Attends religious service: More than 4 times per year    Active member of club or organization: Yes    Attends meetings of clubs or organizations: More than 4 times per year    Relationship status: Married  Other Topics Concern  . Not on file  Social History Narrative   Married   Walks daily    Family History  Problem Relation Age of Onset  . Diabetes Father   . Colon cancer Neg Hx   . Colon polyps Neg Hx   . Esophageal cancer Neg Hx   . Stomach cancer Neg Hx   . Rectal cancer Neg Hx     Review of Systems  Constitutional: Positive for fatigue. Negative for chills and fever.  Respiratory: Positive for shortness of breath (with exertion). Negative for cough and wheezing.   Cardiovascular: Negative for chest pain, palpitations and leg swelling.  Gastrointestinal: Positive for constipation (controlled with metamucil). Negative for abdominal pain, diarrhea and nausea.  Genitourinary: Negative for difficulty urinating, dysuria and hematuria.  Neurological: Negative for light-headedness and headaches.       Objective:   Vitals:   02/13/19 0740  BP: 128/82  Pulse: 80  Temp: 98.2 F (36.8 C)  SpO2: 96%   BP Readings from Last 3 Encounters:  02/13/19 128/82  10/09/18 136/84  10/07/18 (!) 145/86   Wt Readings from Last 3 Encounters:  02/13/19 211 lb (95.7 kg)  10/09/18 214 lb (97.1 kg)  10/07/18 234 lb (106.1 kg)   Body mass index is 30.28 kg/m.   Physical Exam    Constitutional: Appears well-developed and well-nourished. No distress.  HENT:  Head: Normocephalic and atraumatic.  Neck: Neck supple. No tracheal deviation present. No thyromegaly present.  No cervical lymphadenopathy Cardiovascular: Normal rate, regular rhythm and normal heart sounds.   No murmur heard.  No carotid bruit .  No edema Pulmonary/Chest: Effort normal and breath sounds normal. No respiratory distress. No has no wheezes. No rales.  Abdomen:  Soft, NT, ND Skin: Skin is warm and dry. Not diaphoretic.  Psychiatric: Normal mood and affect. Behavior is normal.       Assessment & Plan:    See Problem List for Assessment and Plan of chronic medical problems.

## 2019-02-12 NOTE — Telephone Encounter (Signed)
I have talked with patient's wife---patient's wife denies patient having chest pain, arm pain, fever, n/v/d, no wheezing, is urinating well, is taking all meds as directed, extreme weight gain started about 20 days ago, resulting in about 40-50 pounds, shortness of breath started about the same time--not sure if there's any swelling in his ankles, but definitely huge amount of swelling in abdomin and some in face, and just all over his body---patient does not want to go to urgent care or ED, he does not like those doctors, in the past, they don't really tell him anything and it costs about $500---patient's wife advised that if patient develops other symptoms or worsens thru nite, call 911 or go to ED---patient has scheduled IN OFFICE visit with dr burns for 7:45am 02/13/19

## 2019-02-12 NOTE — Telephone Encounter (Signed)
Sounds like he probably needs to be evaluated in person.  He needs blood work a chest xray and someone to listen to his lungs.    May need to go to urgent care or come in tomorrow early.

## 2019-02-12 NOTE — Telephone Encounter (Signed)
Patients wife stating that the patient has been having ongoing shortness of breath, a cough, tiredness and weight gain in his stomach area.  They are unable to do a virtual visit so I scheduled a phone call visit first thing tomorrow morning because they wife was at work and was not able to be with him to do the appointment at this time.   She said that she works at [employer] and spoke to one of the doctors there who said he did not think it was COVID related but that he needed to be seen.   Please advise if anything more needs to be done at this time.

## 2019-02-12 NOTE — Telephone Encounter (Signed)
After speaking to Jonelle Sidle and Dr Quay Burow they would like patient to be seen in the office. Appointment scheduled and check in process given.

## 2019-02-12 NOTE — Telephone Encounter (Signed)
How would you like to handle this? 

## 2019-02-13 ENCOUNTER — Ambulatory Visit (INDEPENDENT_AMBULATORY_CARE_PROVIDER_SITE_OTHER)
Admission: RE | Admit: 2019-02-13 | Discharge: 2019-02-13 | Disposition: A | Discharge status: Home / Self Care | Payer: Medicare Other | Source: Ambulatory Visit | Attending: Internal Medicine | Admitting: Internal Medicine

## 2019-02-13 ENCOUNTER — Telehealth: Payer: Self-pay | Admitting: Internal Medicine

## 2019-02-13 ENCOUNTER — Ambulatory Visit (INDEPENDENT_AMBULATORY_CARE_PROVIDER_SITE_OTHER): Payer: Medicare Other | Admitting: Internal Medicine

## 2019-02-13 ENCOUNTER — Other Ambulatory Visit: Payer: Self-pay

## 2019-02-13 ENCOUNTER — Other Ambulatory Visit (INDEPENDENT_AMBULATORY_CARE_PROVIDER_SITE_OTHER): Payer: Medicare Other

## 2019-02-13 ENCOUNTER — Encounter: Payer: Self-pay | Admitting: Internal Medicine

## 2019-02-13 VITALS — BP 128/82 | HR 80 | Temp 98.2°F | Ht 70.0 in | Wt 211.0 lb

## 2019-02-13 DIAGNOSIS — R0609 Other forms of dyspnea: Secondary | ICD-10-CM

## 2019-02-13 DIAGNOSIS — E6609 Other obesity due to excess calories: Secondary | ICD-10-CM

## 2019-02-13 DIAGNOSIS — E669 Obesity, unspecified: Secondary | ICD-10-CM | POA: Insufficient documentation

## 2019-02-13 DIAGNOSIS — Z6830 Body mass index (BMI) 30.0-30.9, adult: Secondary | ICD-10-CM

## 2019-02-13 DIAGNOSIS — R5383 Other fatigue: Secondary | ICD-10-CM | POA: Diagnosis not present

## 2019-02-13 DIAGNOSIS — R0600 Dyspnea, unspecified: Secondary | ICD-10-CM | POA: Diagnosis not present

## 2019-02-13 DIAGNOSIS — E663 Overweight: Secondary | ICD-10-CM | POA: Insufficient documentation

## 2019-02-13 LAB — CBC WITH DIFFERENTIAL/PLATELET
Basophils Absolute: 0.1 10*3/uL (ref 0.0–0.1)
Basophils Relative: 0.9 % (ref 0.0–3.0)
Eosinophils Absolute: 0.3 10*3/uL (ref 0.0–0.7)
Eosinophils Relative: 4.6 % (ref 0.0–5.0)
HCT: 39.1 % (ref 39.0–52.0)
Hemoglobin: 12.8 g/dL — ABNORMAL LOW (ref 13.0–17.0)
Lymphocytes Relative: 46.6 % — ABNORMAL HIGH (ref 12.0–46.0)
Lymphs Abs: 2.7 10*3/uL (ref 0.7–4.0)
MCHC: 32.6 g/dL (ref 30.0–36.0)
MCV: 86.8 fl (ref 78.0–100.0)
Monocytes Absolute: 0.6 10*3/uL (ref 0.1–1.0)
Monocytes Relative: 10.4 % (ref 3.0–12.0)
Neutro Abs: 2.2 10*3/uL (ref 1.4–7.7)
Neutrophils Relative %: 37.5 % — ABNORMAL LOW (ref 43.0–77.0)
Platelets: 272 10*3/uL (ref 150.0–400.0)
RBC: 4.51 Mil/uL (ref 4.22–5.81)
RDW: 13.9 % (ref 11.5–15.5)
WBC: 5.9 10*3/uL (ref 4.0–10.5)

## 2019-02-13 LAB — COMPREHENSIVE METABOLIC PANEL
ALT: 37 U/L (ref 0–53)
AST: 42 U/L — ABNORMAL HIGH (ref 0–37)
Albumin: 4 g/dL (ref 3.5–5.2)
Alkaline Phosphatase: 84 U/L (ref 39–117)
BUN: 8 mg/dL (ref 6–23)
CO2: 27 mEq/L (ref 19–32)
Calcium: 9.4 mg/dL (ref 8.4–10.5)
Chloride: 101 mEq/L (ref 96–112)
Creatinine, Ser: 1.06 mg/dL (ref 0.40–1.50)
GFR: 83.85 mL/min (ref >60.00)
Glucose, Bld: 117 mg/dL — ABNORMAL HIGH (ref 70–99)
Potassium: 3.5 mEq/L (ref 3.5–5.1)
Sodium: 136 mEq/L (ref 135–145)
Total Bilirubin: 0.5 mg/dL (ref 0.2–1.2)
Total Protein: 8.3 g/dL (ref 6.0–8.3)

## 2019-02-13 LAB — TSH: TSH: 1.39 u[IU]/mL (ref 0.35–4.50)

## 2019-02-13 NOTE — Assessment & Plan Note (Signed)
He is having some fatigue, but this is not new Some of it may be related to his broken sleep-can try melatonin He also thinks is possibly related to his weight We will check basic blood work to rule out other causes-CBC, CMP, TSH Am not hearing any other concerning symptoms to warrant further evaluation

## 2019-02-13 NOTE — Assessment & Plan Note (Signed)
He does have some shortness of breath with exertion at times when walking around the track-he thinks this is gotten slightly worse, but this is not a new complaint He feels this is all related to his weight and he is trying to lose weight, but is having a tough time No other concerning symptoms We will go ahead and get a chest x-ray and blood work-CBC, CMP, TSH Discussed possible cardiology referral, but he deferred at this time and will continue to exercise and work on his weight loss If the shortness of breath continues/worsens he will let me know so that I can refer him to cardiology

## 2019-02-13 NOTE — Telephone Encounter (Signed)
Patient's wife informed of below.

## 2019-02-13 NOTE — Assessment & Plan Note (Signed)
He is trying to lose weight and having difficulty Discussed with him that I do not think a weight loss medication is safe or would necessarily help him Advised him to increase his exercise-he is doing some exercise, but not as consistently as when the gym was open Also discussed decreasing his portions-he is likely consuming too many calories

## 2019-02-13 NOTE — Telephone Encounter (Signed)
Please call with results.  His chest x-ray is normal.  Kidney function is normal.  Liver tests are normal.  Thyroid function is normal.  His anemia has improved and he barely has any anemia at this point.  Overall blood work looks good and does not explain any of his symptoms.

## 2019-02-13 NOTE — Patient Instructions (Addendum)
Have blood work and a chest x-ray today.   Tests ordered today. Your results will be released to [place] (or called to you) after review, usually within 72hours after test completion. If any changes need to be made, you will be notified at that same time.   Medications reviewed and updated.  Changes include :   none     Decrease your portions.  Increase your exercise.

## 2019-02-26 ENCOUNTER — Other Ambulatory Visit: Payer: Self-pay | Admitting: Internal Medicine

## 2019-03-20 NOTE — Progress Notes (Deleted)
Subjective:   Todd Mendoza is a 68 y.o. male who presents for Medicare Annual/Subsequent preventive examination. I connected with patient by a telephone and verified that I am speaking with the correct person using two identifiers. Patient stated full name and DOB. Patient gave permission to continue with telephonic visit. Patient's location was at home and Nurse's location was at [facility] office.  Review of Systems:     Sleep patterns: {SX; SLEEP PATTERNS:18802::"feels rested on waking","does not get up to void","gets up *** times nightly to void","sleeps *** hours nightly"}.    Home Safety/Smoke Alarms: Feels safe in home. Smoke alarms in place.  Living environment; residence and Firearm Safety: {Rehab home environment / accessibility:30080::"no firearms","firearms stored safely"}. Seat Belt Safety/Bike Helmet: Wears seat belt.   PSA-  Lab Results  Component Value Date   PSA 0.61 10/31/2017   PSA 0.34 11/03/2016   PSA 0.56 03/20/2016       Objective:    Vitals: There were no vitals taken for this visit.  There is no height or weight on file to calculate BMI.  Advanced Directives 10/07/2018 09/05/2018 03/19/2018 04/02/2017 03/15/2017 12/11/2016 09/10/2014  Does Patient Have a Medical Advance Directive? Yes No Yes No No No No  Type of [employer] of [place];Living will - - - -  Does patient want to make changes to medical advance directive? No - Patient declined - - - - - -  Copy of [place] in Chart? No - copy requested - No - copy requested - - - -  Would patient like information on creating a medical advance directive? - No - Patient declined - No - Patient declined Yes (ED - Information included in AVS) - Yes - Educational materials given    Tobacco Social History   Tobacco Use  Smoking Status Former Smoker  . Packs/day: 0.20  . Types: Cigarettes  Smokeless Tobacco Never Used     Counseling given:  Not Answered  Past Medical History:  Diagnosis Date  . Hyperlipidemia   . Hypertension   . Stroke ([place])   . Vertigo    Past Surgical History:  Procedure Laterality Date  . NO PAST SURGERIES     Family History  Problem Relation Age of Onset  . Diabetes Father   . Colon cancer Neg Hx   . Colon polyps Neg Hx   . Esophageal cancer Neg Hx   . Stomach cancer Neg Hx   . Rectal cancer Neg Hx    Social History   Socioeconomic History  . Marital status: Married    Spouse name: Not on file  . Number of children: Not on file  . Years of education: Not on file  . Highest education level: Not on file  Occupational History  . Not on file  Social Needs  . Financial resource strain: Not hard at all  . Food insecurity    Worry: Never true    Inability: Never true  . Transportation needs    Medical: No    Non-medical: No  Tobacco Use  . Smoking status: Former Smoker    Packs/day: 0.20    Types: Cigarettes  . Smokeless tobacco: Never Used  Substance and Sexual Activity  . Alcohol use: No    Alcohol/week: 0.0 standard drinks  . Drug use: No  . Sexual activity: Yes  Lifestyle  . Physical activity    Days per week: 5 days    Minutes per  session: 50 min  . Stress: Not at all  Relationships  . Social connections    Talks on phone: More than three times a week    Gets together: More than three times a week    Attends religious service: More than 4 times per year    Active member of club or organization: Yes    Attends meetings of clubs or organizations: More than 4 times per year    Relationship status: Married  Other Topics Concern  . Not on file  Social History Narrative   Married   Walks daily    Outpatient Encounter Medications as of 03/21/2019  Medication Sig  . albuterol (PROVENTIL HFA;VENTOLIN HFA) 108 (90 Base) MCG/ACT inhaler Inhale 1-2 puffs into the lungs every 6 (six) hours as needed for wheezing or shortness of breath.  Marland Kitchen aspirin EC 81 MG tablet Take 1  tablet (81 mg total) by mouth daily.  Marland Kitchen atorvastatin (LIPITOR) 20 MG tablet TAKE 1 TABLET BY MOUTH DAILY  . hydrochlorothiazide (HYDRODIURIL) 25 MG tablet Take 1 tablet (25 mg total) by mouth daily.  Marland Kitchen ibuprofen (ADVIL,MOTRIN) 600 MG tablet Take 1 tablet (600 mg total) by mouth every 6 (six) hours as needed.   No facility-administered encounter medications on file as of 03/21/2019.     Activities of Daily Living No flowsheet data found.  Patient Care Team: Binnie Rail, MD as PCP - General (Internal Medicine)   Assessment:   This is a routine wellness examination for Izek. Physical assessment deferred to PCP.   Exercise Activities and Dietary recommendations   Diet (meal preparation, eat out, water intake, caffeinated beverages, dairy products, fruits and vegetables): {Desc; diets:16563}     Goals    . Patient Stated     I want to increase my physical activity by starting to go back to the [facility] twice weekly. Watch my diet for fat and salt. 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     . Weight (lb) < 200 lb (90.7 kg)     Continue to exercise and eat as healthy as possible       Fall Risk Fall Risk  03/19/2018 01/08/2018 03/15/2017 03/14/2016 09/10/2014  Falls in the past year? No No No No No  Number falls in past yr: - - - - -  Risk for fall due to : - - - - -    Depression Screen PHQ 2/9 Scores 10/09/2018 03/19/2018 01/08/2018 03/15/2017  PHQ - 2 Score 0 0 0 0    Cognitive Function        Immunization History  Administered Date(s) Administered  . Influenza, High Dose Seasonal PF 09/17/2018  . Pneumococcal Conjugate-13 04/11/2016  . Pneumococcal Polysaccharide-23 05/15/2017  . Tdap 04/02/2017   Screening Tests Health Maintenance  Topic Date Due  . INFLUENZA VACCINE  04/26/2019  . COLONOSCOPY  07/31/2021  . TETANUS/TDAP  04/03/2027  . Hepatitis C Screening  Completed  . PNA vac Low Risk Adult  Completed        Plan:     I have personally reviewed and noted the following in the patient's chart:   . Medical and social history . Use of alcohol, tobacco or illicit drugs  . Current medications and supplements . Functional ability and status . Nutritional status . Physical activity . Advanced directives . List of other physicians . Screenings to include cognitive, depression, and falls . Referrals and appointments  In addition, I have reviewed and discussed with patient certain preventive protocols, quality metrics, and best practice recommendations. A written personalized care plan for preventive services as well as general preventive health recommendations were provided to patient.     Michiel Cowboy, RN  03/20/2019

## 2019-03-21 ENCOUNTER — Telehealth: Payer: Self-pay | Admitting: *Deleted

## 2019-03-21 ENCOUNTER — Ambulatory Visit: Payer: Medicare Other

## 2019-03-21 NOTE — Telephone Encounter (Signed)
Called patient and LVM x2 regarding today's scheduled appointment to do AWV telephonically. Nurse requested that the patient call-back.

## 2019-03-24 ENCOUNTER — Other Ambulatory Visit: Payer: Self-pay | Admitting: Internal Medicine

## 2019-03-25 ENCOUNTER — Ambulatory Visit (INDEPENDENT_AMBULATORY_CARE_PROVIDER_SITE_OTHER): Payer: Medicare Other | Admitting: *Deleted

## 2019-03-25 ENCOUNTER — Other Ambulatory Visit: Payer: Self-pay | Admitting: Internal Medicine

## 2019-03-25 DIAGNOSIS — Z0000 Encounter for general adult medical examination without abnormal findings: Secondary | ICD-10-CM

## 2019-03-25 DIAGNOSIS — I10 Essential (primary) hypertension: Secondary | ICD-10-CM

## 2019-03-25 NOTE — Progress Notes (Addendum)
Subjective:   Todd Mendoza is a 68 y.o. male who presents for Medicare Annual/Subsequent preventive examination. I connected with patient by a telephone and verified that I am speaking with the correct person using two identifiers. Patient stated full name and DOB. Patient gave permission to continue with telephonic visit. Patient's location was at home and Nurse's location was at [place] office.   Review of Systems:   Cardiac Risk Factors include: advanced age (>49men, >91 women);dyslipidemia;male gender;hypertension Sleep patterns: feels rested on waking, gets up 0-1 times nightly to void and sleeps 6-7 hours nightly.    Home Safety/Smoke Alarms: Feels safe in home. Smoke alarms in place.  Living environment; residence and Firearm Safety: 1-story house/ trailer. Lives with wife, no needs for DME, good support system Seat Belt Safety/Bike Helmet: Wears seat belt.   PSA-  Lab Results  Component Value Date   PSA 0.61 10/31/2017   PSA 0.34 11/03/2016   PSA 0.56 03/20/2016       Objective:    Vitals: There were no vitals taken for this visit.  There is no height or weight on file to calculate BMI.  Advanced Directives 03/25/2019 10/07/2018 09/05/2018 03/19/2018 04/02/2017 03/15/2017 12/11/2016  Does Patient Have a Medical Advance Directive? Yes Yes No Yes No No No  Type of Paramedic of [place];Living will Healthcare Power of [place];Living will - - -  Does patient want to make changes to medical advance directive? - No - Patient declined - - - - -  Copy of [place] in Chart? No - copy requested No - copy requested - No - copy requested - - -  Would patient like information on creating a medical advance directive? - - No - Patient declined - No - Patient declined Yes (ED - Information included in AVS) -    Tobacco Social History   Tobacco Use  Smoking Status Former Smoker  . Packs/day: 0.20  . Types:  Cigarettes  Smokeless Tobacco Never Used     Counseling given: Not Answered  Past Medical History:  Diagnosis Date  . Hyperlipidemia   . Hypertension   . Stroke ([place])   . Vertigo    Past Surgical History:  Procedure Laterality Date  . NO PAST SURGERIES     Family History  Problem Relation Age of Onset  . Diabetes Father   . Colon cancer Neg Hx   . Colon polyps Neg Hx   . Esophageal cancer Neg Hx   . Stomach cancer Neg Hx   . Rectal cancer Neg Hx    Social History   Socioeconomic History  . Marital status: Married    Spouse name: Not on file  . Number of children: Not on file  . Years of education: Not on file  . Highest education level: Not on file  Occupational History  . Occupation: Psychologist, clinical a Rent  Social Needs  . Financial resource strain: Not hard at all  . Food insecurity    Worry: Never true    Inability: Never true  . Transportation needs    Medical: No    Non-medical: No  Tobacco Use  . Smoking status: Former Smoker    Packs/day: 0.20    Types: Cigarettes  . Smokeless tobacco: Never Used  Substance and Sexual Activity  . Alcohol use: No    Alcohol/week: 0.0 standard drinks  . Drug use: No  . Sexual activity: Yes  Lifestyle  .  Physical activity    Days per week: 5 days    Minutes per session: 50 min  . Stress: Not at all  Relationships  . Social connections    Talks on phone: More than three times a week    Gets together: More than three times a week    Attends religious service: More than 4 times per year    Active member of club or organization: Yes    Attends meetings of clubs or organizations: More than 4 times per year    Relationship status: Married  Other Topics Concern  . Not on file  Social History Narrative   Married   Walks daily    Outpatient Encounter Medications as of 03/25/2019  Medication Sig  . albuterol (PROVENTIL HFA;VENTOLIN HFA) 108 (90 Base) MCG/ACT inhaler Inhale 1-2 puffs into the lungs every 6 (six) hours  as needed for wheezing or shortness of breath.  Marland Kitchen aspirin EC 81 MG tablet Take 1 tablet (81 mg total) by mouth daily.  Marland Kitchen atorvastatin (LIPITOR) 20 MG tablet TAKE 1 TABLET BY MOUTH EVERY DAY  . hydrochlorothiazide (HYDRODIURIL) 25 MG tablet TAKE 1 TABLET BY MOUTH EVERY DAY  . ibuprofen (ADVIL,MOTRIN) 600 MG tablet Take 1 tablet (600 mg total) by mouth every 6 (six) hours as needed.   No facility-administered encounter medications on file as of 03/25/2019.     Activities of Daily Living In your present state of health, do you have any difficulty performing the following activities: 03/25/2019  Hearing? N  Vision? N  Difficulty concentrating or making decisions? N  Walking or climbing stairs? N  Dressing or bathing? N  Doing errands, shopping? N  Preparing Food and eating ? N  Using the Toilet? N  In the past six months, have you accidently leaked urine? N  Do you have problems with loss of bowel control? N  Managing your Medications? N  Managing your Finances? N  Housekeeping or managing your Housekeeping? N  Some recent data might be hidden    Patient Care Team: Binnie Rail, MD as PCP - General (Internal Medicine)   Assessment:   This is a routine wellness examination for Todd Mendoza. Physical assessment deferred to PCP.   Exercise Activities and Dietary recommendations Current Exercise Habits: Home exercise routine;Structured exercise class(Typically goes to the gym when it is open), Type of exercise: walking;calisthenics;strength training/weights, Time (Minutes): 50, Frequency (Times/Week): 5, Weekly Exercise (Minutes/Week): 250, Intensity: Mild, Exercise limited by: None identified  Diet (meal preparation, eat out, water intake, caffeinated beverages, dairy products, fruits and vegetables): in general, a "healthy" diet  , well balanced. eats a variety of fruits and vegetables daily, limits salt, fat/cholesterol, sugar,carbohydrates,caffeine, drinks 6-8 glasses of water daily.    Goals    . Patient Stated     I want to increase my physical activity by starting to go back to the [facility] twice weekly. Watch my diet for fat and salt. 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     . Weight (lb) < 200 lb (90.7 kg)     Continue to exercise and eat as healthy as possible       Fall Risk Fall Risk  03/25/2019 03/19/2018 01/08/2018 03/15/2017 03/14/2016  Falls in the past year? 0 No No No No  Number falls in past yr: - - - - -  Risk for fall due to : - - - - -  Depression Screen PHQ 2/9 Scores 03/25/2019 10/09/2018 03/19/2018 01/08/2018  PHQ - 2 Score 0 0 0 0    Cognitive Function       Ad8 score reviewed for issues:  Issues making decisions: no  Less interest in hobbies / activities: no  Repeats questions, stories (family complaining): no  Trouble using ordinary gadgets (microwave, computer, phone):no  Forgets the month or year: no  Mismanaging finances: no  Remembering appts: no  Daily problems with thinking and/or memory: no Ad8 score is= 0  Immunization History  Administered Date(s) Administered  . Influenza, High Dose Seasonal PF 09/17/2018  . Pneumococcal Conjugate-13 04/11/2016  . Pneumococcal Polysaccharide-23 05/15/2017  . Tdap 04/02/2017   Screening Tests Health Maintenance  Topic Date Due  . INFLUENZA VACCINE  04/26/2019  . COLONOSCOPY  07/31/2021  . TETANUS/TDAP  04/03/2027  . Hepatitis C Screening  Completed  . PNA vac Low Risk Adult  Completed       Plan:    Reviewed health maintenance screenings with patient today and relevant education, vaccines, and/or referrals were provided.   Continue to eat heart healthy diet (full of fruits, vegetables, whole grains, lean protein, water--limit salt, fat, and sugar intake) and increase physical activity as tolerated.  Continue doing brain stimulating activities (puzzles, reading, adult coloring books, staying active) to keep memory  sharp.   I have personally reviewed and noted the following in the patient's chart:   . Medical and social history . Use of alcohol, tobacco or illicit drugs  . Current medications and supplements . Functional ability and status . Nutritional status . Physical activity . Advanced directives . List of other physicians . Screenings to include cognitive, depression, and falls . Referrals and appointments  In addition, I have reviewed and discussed with patient certain preventive protocols, quality metrics, and best practice recommendations. A written personalized care plan for preventive services as well as general preventive health recommendations were provided to patient.     Michiel Cowboy, RN  03/25/2019   Medical screening examination/treatment/procedure(s) were performed by non-physician practitioner and as supervising physician I was immediately available for consultation/collaboration. I agree with above. Binnie Rail, MD

## 2019-04-09 NOTE — Progress Notes (Signed)
Subjective:    Patient ID: Todd Mendoza, male    DOB: 1951/03/06, 68 y.o.   MRN: [id number]  HPI The patient is here for follow up.  He is exercising irregularly.    Hypertension: He is taking his medication daily. He is compliant with a low sodium diet.  He denies chest pain, palpitations, edema, shortness of breath and regular headaches.  He does not monitor his blood pressure at home.    Hyperlipidemia: He is taking his medication daily. He is compliant with a low fat/cholesterol diet. He denies myalgias.   H/o CVA:  He is taking his medications daily.     Medications and allergies reviewed with patient and updated if appropriate.  Patient Active Problem List   Diagnosis Date Noted  . DOE (dyspnea on exertion) 02/13/2019  . Obese 02/13/2019  . Anemia 04/30/2018  . Fatigue 04/30/2018  . ED (erectile dysfunction) 11/03/2016  . Lumbar radiculopathy 06/30/2016  . Family history of diabetes mellitus 03/14/2016  . Encounter for prostate cancer screening 03/14/2016  . H/O: CVA (cerebrovascular accident) 12/26/2012  . HTN (hypertension) 12/26/2012  . HLD (hyperlipidemia) 12/26/2012  . Allergic rhinitis 12/26/2012    Current Outpatient Medications on File Prior to Visit  Medication Sig Dispense Refill  . albuterol (PROVENTIL HFA;VENTOLIN HFA) 108 (90 Base) MCG/ACT inhaler Inhale 1-2 puffs into the lungs every 6 (six) hours as needed for wheezing or shortness of breath. 1 Inhaler 0  . aspirin EC 81 MG tablet Take 1 tablet (81 mg total) by mouth daily. 30 tablet 0  . atorvastatin (LIPITOR) 20 MG tablet TAKE 1 TABLET BY MOUTH EVERY DAY 30 tablet 0  . hydrochlorothiazide (HYDRODIURIL) 25 MG tablet TAKE 1 TABLET BY MOUTH EVERY DAY 90 tablet 1  . ibuprofen (ADVIL,MOTRIN) 600 MG tablet Take 1 tablet (600 mg total) by mouth every 6 (six) hours as needed. 30 tablet 0   No current facility-administered medications on file prior to visit.     Past Medical History:  Diagnosis Date  .  Hyperlipidemia   . Hypertension   . Stroke ([place])   . Vertigo     Past Surgical History:  Procedure Laterality Date  . NO PAST SURGERIES      Social History   Socioeconomic History  . Marital status: Married    Spouse name: Not on file  . Number of children: Not on file  . Years of education: Not on file  . Highest education level: Not on file  Occupational History  . Occupation: Psychologist, clinical a Rent  Social Needs  . Financial resource strain: Not hard at all  . Food insecurity    Worry: Never true    Inability: Never true  . Transportation needs    Medical: No    Non-medical: No  Tobacco Use  . Smoking status: Former Smoker    Packs/day: 0.20    Types: Cigarettes  . Smokeless tobacco: Never Used  Substance and Sexual Activity  . Alcohol use: No    Alcohol/week: 0.0 standard drinks  . Drug use: No  . Sexual activity: Yes  Lifestyle  . Physical activity    Days per week: 5 days    Minutes per session: 50 min  . Stress: Not at all  Relationships  . Social connections    Talks on phone: More than three times a week    Gets together: More than three times a week    Attends religious service: More than 4 times per  year    Active member of club or organization: Yes    Attends meetings of clubs or organizations: More than 4 times per year    Relationship status: Married  Other Topics Concern  . Not on file  Social History Narrative   Married   Walks daily    Family History  Problem Relation Age of Onset  . Diabetes Father   . Colon cancer Neg Hx   . Colon polyps Neg Hx   . Esophageal cancer Neg Hx   . Stomach cancer Neg Hx   . Rectal cancer Neg Hx     Review of Systems  Constitutional: Negative for chills and fever.  Respiratory: Negative for cough, shortness of breath and wheezing.   Cardiovascular: Negative for chest pain, palpitations and leg swelling.  Musculoskeletal: Negative for myalgias.  Neurological: Negative for weakness, light-headedness,  numbness and headaches.       Objective:   Vitals:   04/10/19 0741  BP: 138/88  Pulse: 71  Resp: 15  Temp: 98.2 F (36.8 C)  SpO2: 98%   BP Readings from Last 3 Encounters:  04/10/19 138/88  02/13/19 128/82  10/09/18 136/84   Wt Readings from Last 3 Encounters:  04/10/19 209 lb (94.8 kg)  02/13/19 211 lb (95.7 kg)  10/09/18 214 lb (97.1 kg)   Body mass index is 29.99 kg/m.   Physical Exam    Constitutional: Appears well-developed and well-nourished. No distress.  HENT:  Head: Normocephalic and atraumatic.  Neck: Neck supple. No tracheal deviation present. No thyromegaly present.  No cervical lymphadenopathy Cardiovascular: Normal rate, regular rhythm and normal heart sounds.   No murmur heard. No carotid bruit .  No edema Pulmonary/Chest: Effort normal and breath sounds normal. No respiratory distress. No has no wheezes. No rales.  Skin: Skin is warm and dry. Not diaphoretic.  Psychiatric: Normal mood and affect. Behavior is normal.      Assessment & Plan:    See Problem List for Assessment and Plan of chronic medical problems.

## 2019-04-09 NOTE — Patient Instructions (Addendum)
  Medications reviewed and updated.  Changes include :   none    Please followup in 6 months   

## 2019-04-10 ENCOUNTER — Ambulatory Visit (INDEPENDENT_AMBULATORY_CARE_PROVIDER_SITE_OTHER): Payer: Medicare Other | Admitting: Internal Medicine

## 2019-04-10 ENCOUNTER — Other Ambulatory Visit: Payer: Self-pay

## 2019-04-10 ENCOUNTER — Encounter: Payer: Self-pay | Admitting: Internal Medicine

## 2019-04-10 VITALS — BP 138/88 | HR 71 | Temp 98.2°F | Resp 15 | Ht 70.0 in | Wt 209.0 lb

## 2019-04-10 DIAGNOSIS — I10 Essential (primary) hypertension: Secondary | ICD-10-CM | POA: Diagnosis not present

## 2019-04-10 DIAGNOSIS — Z833 Family history of diabetes mellitus: Secondary | ICD-10-CM | POA: Diagnosis not present

## 2019-04-10 DIAGNOSIS — Z8673 Personal history of transient ischemic attack (TIA), and cerebral infarction without residual deficits: Secondary | ICD-10-CM

## 2019-04-10 DIAGNOSIS — E7849 Other hyperlipidemia: Secondary | ICD-10-CM | POA: Diagnosis not present

## 2019-04-10 NOTE — Assessment & Plan Note (Signed)
Low sugar diet Increase exercise Work on weight loss

## 2019-04-10 NOTE — Assessment & Plan Note (Signed)
Continue statin, ASA 81 mg Stressed increasing exercise to help decrease BP - may need additional medication Low sodiumd diet

## 2019-04-10 NOTE — Assessment & Plan Note (Signed)
Continue statin LDL 62 09/2018 Will recheck lipid panel in 6 months Increase exercise

## 2019-04-10 NOTE — Assessment & Plan Note (Addendum)
Higher than ideal Will work on increasing exercise and weight loss Low sodium diet Continue current medication May need additional medication at next visit if BP still on high side

## 2019-04-19 ENCOUNTER — Other Ambulatory Visit: Payer: Self-pay | Admitting: Internal Medicine

## 2019-06-23 NOTE — Progress Notes (Signed)
Subjective:    Patient ID: Todd Mendoza, male    DOB: 1951/03/06, 68 y.o.   MRN: [id number]  HPI The patient is here for an acute visit.   His wife was diagnosed with hepatitis C.  He wants to be tested.  He feels he is low risk and has no concerning symptoms, but she wanted him to be tested.  He denies any nominal pain, abnormal stools, blood in the stool or black stool.  He denies any nausea or heartburn.  She is apparently on treatment, but he is not sure of the details.  She is unsure when she contracted this.    Medications and allergies reviewed with patient and updated if appropriate.  Patient Active Problem List   Diagnosis Date Noted  . DOE (dyspnea on exertion) 02/13/2019  . Obese 02/13/2019  . Anemia 04/30/2018  . Fatigue 04/30/2018  . ED (erectile dysfunction) 11/03/2016  . Lumbar radiculopathy 06/30/2016  . Family history of diabetes mellitus 03/14/2016  . Encounter for prostate cancer screening 03/14/2016  . H/O: CVA (cerebrovascular accident) 12/26/2012  . HTN (hypertension) 12/26/2012  . HLD (hyperlipidemia) 12/26/2012  . Allergic rhinitis 12/26/2012    Current Outpatient Medications on File Prior to Visit  Medication Sig Dispense Refill  . albuterol (PROVENTIL HFA;VENTOLIN HFA) 108 (90 Base) MCG/ACT inhaler Inhale 1-2 puffs into the lungs every 6 (six) hours as needed for wheezing or shortness of breath. 1 Inhaler 0  . aspirin EC 81 MG tablet Take 1 tablet (81 mg total) by mouth daily. 30 tablet 0  . atorvastatin (LIPITOR) 20 MG tablet TAKE 1 TABLET BY MOUTH EVERY DAY 90 tablet 1  . hydrochlorothiazide (HYDRODIURIL) 25 MG tablet TAKE 1 TABLET BY MOUTH EVERY DAY 90 tablet 1  . ibuprofen (ADVIL,MOTRIN) 600 MG tablet Take 1 tablet (600 mg total) by mouth every 6 (six) hours as needed. 30 tablet 0   No current facility-administered medications on file prior to visit.     Past Medical History:  Diagnosis Date  . Hyperlipidemia   . Hypertension   . Stroke  ([place])   . Vertigo     Past Surgical History:  Procedure Laterality Date  . NO PAST SURGERIES      Social History   Socioeconomic History  . Marital status: Married    Spouse name: Not on file  . Number of children: Not on file  . Years of education: Not on file  . Highest education level: Not on file  Occupational History  . Occupation: Psychologist, clinical a [employer]  Social Needs  . Financial resource strain: Not hard at all  . Food insecurity    Worry: Never true    Inability: Never true  . Transportation needs    Medical: No    Non-medical: No  Tobacco Use  . Smoking status: Former Smoker    Packs/day: 0.20    Types: Cigarettes  . Smokeless tobacco: Never Used  Substance and Sexual Activity  . Alcohol use: No    Alcohol/week: 0.0 standard drinks  . Drug use: No  . Sexual activity: Yes  Lifestyle  . Physical activity    Days per week: 5 days    Minutes per session: 50 min  . Stress: Not at all  Relationships  . Social connections    Talks on phone: More than three times a week    Gets together: More than three times a week    Attends religious service: More than 4 times per  year    Active member of club or organization: Yes    Attends meetings of clubs or organizations: More than 4 times per year    Relationship status: Married  Other Topics Concern  . Not on file  Social History Narrative   Married   Walks daily    Family History  Problem Relation Age of Onset  . Diabetes Father   . Colon cancer Neg Hx   . Colon polyps Neg Hx   . Esophageal cancer Neg Hx   . Stomach cancer Neg Hx   . Rectal cancer Neg Hx     Review of Systems  Constitutional: Negative for chills, fatigue and fever.  Gastrointestinal: Negative for abdominal pain, blood in stool, constipation, diarrhea and nausea.       Objective:   Vitals:   06/24/19 1109  BP: 138/82  Pulse: 67  Resp: 16  Temp: 98.5 F (36.9 C)  SpO2: 95%   BP Readings from Last 3 Encounters:  06/24/19  138/82  04/10/19 138/88  02/13/19 128/82   Wt Readings from Last 3 Encounters:  06/24/19 212 lb 1.9 oz (96.2 kg)  04/10/19 209 lb (94.8 kg)  02/13/19 211 lb (95.7 kg)   Body mass index is 30.44 kg/m.   Physical Exam Constitutional:      Appearance: Normal appearance.  HENT:     Head: Normocephalic and atraumatic.  Abdominal:     General: There is no distension.     Palpations: Abdomen is soft.     Tenderness: There is no abdominal tenderness. There is no guarding or rebound.  Skin:    General: Skin is warm and dry.  Neurological:     Mental Status: He is alert.            Assessment & Plan:    See Problem List for Assessment and Plan of chronic medical problems.

## 2019-06-24 ENCOUNTER — Other Ambulatory Visit: Payer: Self-pay

## 2019-06-24 ENCOUNTER — Other Ambulatory Visit (INDEPENDENT_AMBULATORY_CARE_PROVIDER_SITE_OTHER): Payer: Medicare Other

## 2019-06-24 ENCOUNTER — Ambulatory Visit (INDEPENDENT_AMBULATORY_CARE_PROVIDER_SITE_OTHER): Payer: Medicare Other | Admitting: Internal Medicine

## 2019-06-24 ENCOUNTER — Encounter: Payer: Self-pay | Admitting: Internal Medicine

## 2019-06-24 VITALS — BP 138/82 | HR 67 | Temp 98.5°F | Resp 16 | Ht 70.0 in | Wt 212.1 lb

## 2019-06-24 DIAGNOSIS — Z1159 Encounter for screening for other viral diseases: Secondary | ICD-10-CM

## 2019-06-24 DIAGNOSIS — Z205 Contact with and (suspected) exposure to viral hepatitis: Secondary | ICD-10-CM | POA: Diagnosis not present

## 2019-06-24 DIAGNOSIS — B1920 Unspecified viral hepatitis C without hepatic coma: Secondary | ICD-10-CM | POA: Insufficient documentation

## 2019-06-24 DIAGNOSIS — Z23 Encounter for immunization: Secondary | ICD-10-CM | POA: Diagnosis not present

## 2019-06-24 LAB — HEPATIC FUNCTION PANEL
ALT: 28 U/L (ref 0–53)
AST: 33 U/L (ref 0–37)
Albumin: 3.9 g/dL (ref 3.5–5.2)
Alkaline Phosphatase: 85 U/L (ref 39–117)
Bilirubin, Direct: 0 mg/dL (ref 0.0–0.3)
Total Bilirubin: 0.5 mg/dL (ref 0.2–1.2)
Total Protein: 7.9 g/dL (ref 6.0–8.3)

## 2019-06-24 NOTE — Assessment & Plan Note (Signed)
His wife was diagnosed with hepatitis C-it is unsure when she contracted it and is currently undergoing treatment He would like to be tested He has no concerning symptoms and his exam is normal Hepatitis C antibody, hepatic function blood work today

## 2019-06-24 NOTE — Patient Instructions (Signed)
  Tests ordered today. Your results will be released to MyChart (or called to you) after review.  If any changes need to be made, you will be notified at that same time.  Flu immunization administered today.    Medications reviewed and updated.  Changes include :   none     

## 2019-06-26 ENCOUNTER — Encounter: Payer: Self-pay | Admitting: Internal Medicine

## 2019-06-26 DIAGNOSIS — R768 Other specified abnormal immunological findings in serum: Secondary | ICD-10-CM | POA: Insufficient documentation

## 2019-06-28 LAB — HCV RNA,QUANTITATIVE REAL TIME PCR
HCV Quantitative Log: 6.56 Log IU/mL — ABNORMAL HIGH
HCV RNA, PCR, QN: 3630000 IU/mL — ABNORMAL HIGH

## 2019-06-28 LAB — HEPATITIS C ANTIBODY
Hepatitis C Ab: REACTIVE — AB
SIGNAL TO CUT-OFF: 34.1 — ABNORMAL HIGH (ref <1.00)

## 2019-07-01 ENCOUNTER — Other Ambulatory Visit: Payer: Self-pay | Admitting: Internal Medicine

## 2019-07-01 DIAGNOSIS — R768 Other specified abnormal immunological findings in serum: Secondary | ICD-10-CM

## 2019-07-22 DIAGNOSIS — D689 Coagulation defect, unspecified: Secondary | ICD-10-CM | POA: Diagnosis not present

## 2019-07-22 DIAGNOSIS — Z1159 Encounter for screening for other viral diseases: Secondary | ICD-10-CM | POA: Diagnosis not present

## 2019-07-22 DIAGNOSIS — R945 Abnormal results of liver function studies: Secondary | ICD-10-CM | POA: Diagnosis not present

## 2019-07-25 DIAGNOSIS — R945 Abnormal results of liver function studies: Secondary | ICD-10-CM | POA: Diagnosis not present

## 2019-08-05 DIAGNOSIS — D689 Coagulation defect, unspecified: Secondary | ICD-10-CM | POA: Diagnosis not present

## 2019-09-10 ENCOUNTER — Other Ambulatory Visit: Payer: Self-pay | Admitting: Internal Medicine

## 2019-09-10 DIAGNOSIS — I10 Essential (primary) hypertension: Secondary | ICD-10-CM

## 2019-10-04 DIAGNOSIS — R945 Abnormal results of liver function studies: Secondary | ICD-10-CM | POA: Diagnosis not present

## 2019-10-11 DIAGNOSIS — R739 Hyperglycemia, unspecified: Secondary | ICD-10-CM | POA: Insufficient documentation

## 2019-10-11 NOTE — Assessment & Plan Note (Addendum)
chronic W/o residual effects Continue ASA, statin

## 2019-10-11 NOTE — Patient Instructions (Addendum)
  Blood work was ordered.   ° ° °Medications reviewed and updated.  Changes include :   none ° ° ° °Please followup in 6 months ° ° °

## 2019-10-11 NOTE — Progress Notes (Signed)
Subjective:    Patient ID: Todd Mendoza, male    DOB: 1951/09/06, 69 y.o.   MRN: [id number]  HPI The patient is here for follow up of their chronic medical problems, including hypertension, hyperlipidemia, hyperglycemia, h/o CVA and hepatitis C.   He is not exercising regularly.    He is taking all of his medications as prescribed.    He has bad headaches every two weeks approximately.  He takes aleve and it goes away.  He is not too concerned, but his wife was.     Medications and allergies reviewed with patient and updated if appropriate.  Patient Active Problem List   Diagnosis Date Noted  . Hyperglycemia 10/11/2019  . Hepatitis C antibody test positive 06/26/2019  . DOE (dyspnea on exertion) 02/13/2019  . Obese 02/13/2019  . Anemia 04/30/2018  . Fatigue 04/30/2018  . ED (erectile dysfunction) 11/03/2016  . Lumbar radiculopathy 06/30/2016  . Family history of diabetes mellitus 03/14/2016  . Encounter for prostate cancer screening 03/14/2016  . H/O: CVA (cerebrovascular accident) 12/26/2012  . HTN (hypertension) 12/26/2012  . HLD (hyperlipidemia) 12/26/2012  . Allergic rhinitis 12/26/2012    Current Outpatient Medications on File Prior to Visit  Medication Sig Dispense Refill  . albuterol (PROVENTIL HFA;VENTOLIN HFA) 108 (90 Base) MCG/ACT inhaler Inhale 1-2 puffs into the lungs every 6 (six) hours as needed for wheezing or shortness of breath. 1 Inhaler 0  . aspirin EC 81 MG tablet Take 1 tablet (81 mg total) by mouth daily. 30 tablet 0  . atorvastatin (LIPITOR) 20 MG tablet TAKE 1 TABLET BY MOUTH EVERY DAY 90 tablet 1  . hydrochlorothiazide (HYDRODIURIL) 25 MG tablet TAKE 1 TABLET BY MOUTH EVERY DAY 90 tablet 1  . Sofosbuvir-Velpatasvir 400-100 MG TABS      No current facility-administered medications on file prior to visit.    Past Medical History:  Diagnosis Date  . Hyperlipidemia   . Hypertension   . Stroke ([place])   . Vertigo     Past Surgical History:    Procedure Laterality Date  . NO PAST SURGERIES      Social History   Socioeconomic History  . Marital status: Married    Spouse name: Not on file  . Number of children: Not on file  . Years of education: Not on file  . Highest education level: Not on file  Occupational History  . 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 Smoking status: Former Smoker    Packs/day: 0.20    Types: Cigarettes  . Smokeless tobacco: Never Used  Substance and Sexual Activity  . Alcohol use: No    Alcohol/week: 0.0 standard drinks  . Drug use: No  . Sexual activity: Yes  Other Topics Concern  . Not on file  Social History Narrative   Married   Psychologist, counselling daily   Social Determinants of Health   Financial Resource Strain:   . Difficulty of Paying Living Expenses: Not on file  Food Insecurity:   . Worried About Charity fundraiser in the Last Year: Not on file  . Ran Out of Food in the Last Year: Not on file  Transportation Needs:   . Lack of Transportation (Medical): Not on file  . Lack of Transportation (Non-Medical): Not on file  Physical Activity:   . Days of Exercise per Week: Not on file  . Minutes of Exercise per Session: Not on file  Stress:   . Feeling of Stress :  Not on file  Social Connections:   . Frequency of Communication with Friends and Family: Not on file  . Frequency of Social Gatherings with Friends and Family: Not on file  . Attends Religious Services: Not on file  . Active Member of Clubs or Organizations: Not on file  . Attends Archivist Meetings: Not on file  . Marital Status: Not on file    Family History  Problem Relation Age of Onset  . Diabetes Father   . Colon cancer Neg Hx   . Colon polyps Neg Hx   . Esophageal cancer Neg Hx   . Stomach cancer Neg Hx   . Rectal cancer Neg Hx     Review of Systems  Constitutional: Negative for fever.  Respiratory: Negative for cough, shortness of breath and wheezing.   Cardiovascular: Negative for  chest pain, palpitations and leg swelling.  Neurological: Positive for light-headedness (occ) and headaches.       Objective:   Vitals:   10/13/19 0744  BP: 140/82  Pulse: 78  Resp: 16  Temp: 98.2 F (36.8 C)  SpO2: 96%   BP Readings from Last 3 Encounters:  10/13/19 140/82  06/24/19 138/82  04/10/19 138/88   Wt Readings from Last 3 Encounters:  10/13/19 212 lb (96.2 kg)  06/24/19 212 lb 1.9 oz (96.2 kg)  04/10/19 209 lb (94.8 kg)   Body mass index is 30.42 kg/m.   Physical Exam    Constitutional: Appears well-developed and well-nourished. No distress.  HENT:  Head: Normocephalic and atraumatic.  Neck: Neck supple. No tracheal deviation present. No thyromegaly present.  No cervical lymphadenopathy Cardiovascular: Normal rate, regular rhythm and normal heart sounds.   No murmur heard. No carotid bruit .  No edema Pulmonary/Chest: Effort normal and breath sounds normal. No respiratory distress. No has no wheezes. No rales.  Skin: Skin is warm and dry. Not diaphoretic.  Psychiatric: Normal mood and affect. Behavior is normal.      Assessment & Plan:    See Problem List for Assessment and Plan of chronic medical problems.    This visit occurred during the SARS-CoV-2 public health emergency.  Safety protocols were in place, including screening questions prior to the visit, additional usage of staff PPE, and extensive cleaning of exam room while observing appropriate contact time as indicated for disinfecting solutions.

## 2019-10-13 ENCOUNTER — Other Ambulatory Visit: Payer: Self-pay

## 2019-10-13 ENCOUNTER — Ambulatory Visit (INDEPENDENT_AMBULATORY_CARE_PROVIDER_SITE_OTHER): Payer: Medicare Other | Admitting: Internal Medicine

## 2019-10-13 ENCOUNTER — Encounter: Payer: Self-pay | Admitting: Internal Medicine

## 2019-10-13 VITALS — BP 140/82 | HR 78 | Temp 98.2°F | Resp 16 | Ht 70.0 in | Wt 212.0 lb

## 2019-10-13 DIAGNOSIS — E7849 Other hyperlipidemia: Secondary | ICD-10-CM | POA: Diagnosis not present

## 2019-10-13 DIAGNOSIS — Z8673 Personal history of transient ischemic attack (TIA), and cerebral infarction without residual deficits: Secondary | ICD-10-CM | POA: Diagnosis not present

## 2019-10-13 DIAGNOSIS — Z6830 Body mass index (BMI) 30.0-30.9, adult: Secondary | ICD-10-CM

## 2019-10-13 DIAGNOSIS — I10 Essential (primary) hypertension: Secondary | ICD-10-CM | POA: Diagnosis not present

## 2019-10-13 DIAGNOSIS — R768 Other specified abnormal immunological findings in serum: Secondary | ICD-10-CM | POA: Diagnosis not present

## 2019-10-13 DIAGNOSIS — R739 Hyperglycemia, unspecified: Secondary | ICD-10-CM

## 2019-10-13 DIAGNOSIS — R519 Headache, unspecified: Secondary | ICD-10-CM | POA: Insufficient documentation

## 2019-10-13 DIAGNOSIS — E6609 Other obesity due to excess calories: Secondary | ICD-10-CM

## 2019-10-13 LAB — COMPREHENSIVE METABOLIC PANEL
ALT: 20 U/L (ref 0–53)
AST: 28 U/L (ref 0–37)
Albumin: 4 g/dL (ref 3.5–5.2)
Alkaline Phosphatase: 91 U/L (ref 39–117)
BUN: 13 mg/dL (ref 6–23)
CO2: 28 mEq/L (ref 19–32)
Calcium: 9.8 mg/dL (ref 8.4–10.5)
Chloride: 101 mEq/L (ref 96–112)
Creatinine, Ser: 0.97 mg/dL (ref 0.40–1.50)
GFR: 92.71 mL/min (ref >60.00)
Glucose, Bld: 79 mg/dL (ref 70–99)
Potassium: 3.5 mEq/L (ref 3.5–5.1)
Sodium: 137 mEq/L (ref 135–145)
Total Bilirubin: 0.5 mg/dL (ref 0.2–1.2)
Total Protein: 8.2 g/dL (ref 6.0–8.3)

## 2019-10-13 LAB — LIPID PANEL
Cholesterol: 123 mg/dL (ref 0–200)
HDL: 31.7 mg/dL — ABNORMAL LOW (ref >39.00)
LDL Cholesterol: 69 mg/dL (ref 0–99)
NonHDL: 90.92
Total CHOL/HDL Ratio: 4
Triglycerides: 109 mg/dL (ref 0.0–149.0)
VLDL: 21.8 mg/dL (ref 0.0–40.0)

## 2019-10-13 LAB — HEMOGLOBIN A1C: Hgb A1c MFr Bld: 5.4 % (ref 4.6–6.5)

## 2019-10-13 NOTE — Assessment & Plan Note (Addendum)
Tested positive for Hep C, has seen specialist Currently on sofobuvir-velpatasvir - just started it - will be on it for 8 weeks Per pt liver looked good He denies side effects from the medication

## 2019-10-13 NOTE — Assessment & Plan Note (Signed)
occurring every two weeks - relieved with aleve Discussed some of the common causes of headaches He is not concerned For now continue aleve prn

## 2019-10-13 NOTE — Assessment & Plan Note (Signed)
Chronic Check lipid panel  Continue daily statin Regular exercise and healthy diet encouraged  

## 2019-10-13 NOTE — Assessment & Plan Note (Signed)
With htn, hyperlipdemia Stressed exercise, healthy diet, decreased portions Will work on weight loss F/u in 6 months

## 2019-10-13 NOTE — Assessment & Plan Note (Signed)
Chronic Check a1c Low sugar / carb diet Stressed regular exercise  

## 2019-10-13 NOTE — Assessment & Plan Note (Addendum)
Chronic BP controlled Current regimen effective and well tolerated Continue current medications at current doses cmp  

## 2019-10-18 ENCOUNTER — Other Ambulatory Visit: Payer: Self-pay | Admitting: Internal Medicine

## 2019-11-10 DIAGNOSIS — Z79899 Other long term (current) drug therapy: Secondary | ICD-10-CM | POA: Diagnosis not present

## 2019-11-16 NOTE — Progress Notes (Signed)
Subjective:    Patient ID: Todd Mendoza, male    DOB: 07/29/1951, 69 y.o.   MRN: [id number]  HPI The patient is here for an acute visit.  He went to the [employer] and started working out.  His back pain started a couple of days ago.  He woke up with back pain.  When he got out of bed he felt like his pain was stronger and his back was going to give out.  The is in his lower central back.  He put some some methanol smelling otc medication.  When he lays down he has to lay in a certain way because of the pain.  He has intermittent right upper lateral leg pain which stops mid-thigh.  He has some tingling/numbness in the right upper leg.  He denies leg weakness.      Weight gain;  He would like to be put on a weight loss pill.  He drinks soda every once in while.  He drinks one glass of welch's fruit punch.  He drinks a canned coffee drink on occasion.  He eats a bigger dinner.  He does not eat breakfast.  He snacks on honey graham crackers and rice krispy treats.      Medications and allergies reviewed with patient and updated if appropriate.  Patient Active Problem List   Diagnosis Date Noted  . Frequent headaches 10/13/2019  . Hyperglycemia 10/11/2019  . Hepatitis C antibody test positive 06/26/2019  . DOE (dyspnea on exertion) 02/13/2019  . Obese 02/13/2019  . Anemia 04/30/2018  . Fatigue 04/30/2018  . ED (erectile dysfunction) 11/03/2016  . Lumbar radiculopathy 06/30/2016  . Family history of diabetes mellitus 03/14/2016  . Encounter for prostate cancer screening 03/14/2016  . H/O: CVA (cerebrovascular accident) 12/26/2012  . HTN (hypertension) 12/26/2012  . HLD (hyperlipidemia) 12/26/2012  . Allergic rhinitis 12/26/2012    Current Outpatient Medications on File Prior to Visit  Medication Sig Dispense Refill  . albuterol (PROVENTIL HFA;VENTOLIN HFA) 108 (90 Base) MCG/ACT inhaler Inhale 1-2 puffs into the lungs every 6 (six) hours as needed for wheezing or shortness of breath. 1  Inhaler 0  . aspirin EC 81 MG tablet Take 1 tablet (81 mg total) by mouth daily. 30 tablet 0  . atorvastatin (LIPITOR) 20 MG tablet TAKE 1 TABLET BY MOUTH EVERY DAY 90 tablet 3  . hydrochlorothiazide (HYDRODIURIL) 25 MG tablet TAKE 1 TABLET BY MOUTH EVERY DAY 90 tablet 1  . Sofosbuvir-Velpatasvir 400-100 MG TABS      No current facility-administered medications on file prior to visit.    Past Medical History:  Diagnosis Date  . Hyperlipidemia   . Hypertension   . Stroke ([place])   . Vertigo     Past Surgical History:  Procedure Laterality Date  . NO PAST SURGERIES      Social History   Socioeconomic History  . Marital status: Married    Spouse name: Not on file  . Number of children: Not on file  . Years of education: Not on file  . Highest education level: Not on file  Occupational History  . 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 Smoking status: Former Smoker    Packs/day: 0.20    Types: Cigarettes  . Smokeless tobacco: Never Used  Substance and Sexual Activity  . Alcohol use: No    Alcohol/week: 0.0 standard drinks  . Drug use: No  . Sexual activity: Yes  Other Topics Concern  . Not  on file  Social History Narrative   Married   Psychologist, counselling daily   Social Determinants of Health   Financial Resource Strain:   . Difficulty of Paying Living Expenses: Not on file  Food Insecurity:   . Worried About Charity fundraiser in the Last Year: Not on file  . Ran Out of Food in the Last Year: Not on file  Transportation Needs:   . Lack of Transportation (Medical): Not on file  . Lack of Transportation (Non-Medical): Not on file  Physical Activity:   . Days of Exercise per Week: Not on file  . Minutes of Exercise per Session: Not on file  Stress:   . Feeling of Stress : Not on file  Social Connections:   . Frequency of Communication with Friends and Family: Not on file  . Frequency of Social Gatherings with Friends and Family: Not on file  . Attends Religious  Services: Not on file  . Active Member of Clubs or Organizations: Not on file  . Attends Archivist Meetings: Not on file  . Marital Status: Not on file    Family History  Problem Relation Age of Onset  . Diabetes Father   . Colon cancer Neg Hx   . Colon polyps Neg Hx   . Esophageal cancer Neg Hx   . Stomach cancer Neg Hx   . Rectal cancer Neg Hx     Review of Systems  Constitutional: Negative for fever.  Musculoskeletal: Positive for back pain and myalgias (lower back).  Neurological: Positive for numbness. Negative for weakness.       Objective:   Vitals:   11/17/19 1341  BP: 140/80  Pulse: 70  Resp: 16  Temp: 98.2 F (36.8 C)  SpO2: 96%   BP Readings from Last 3 Encounters:  11/17/19 140/80  10/13/19 140/82  06/24/19 138/82   Wt Readings from Last 3 Encounters:  11/17/19 219 lb (99.3 kg)  10/13/19 212 lb (96.2 kg)  06/24/19 212 lb 1.9 oz (96.2 kg)   Body mass index is 31.42 kg/m.   Physical Exam Constitutional:      Appearance: Normal appearance.  Musculoskeletal:        General: Tenderness (lower back with palpation) present.     Right lower leg: No edema.     Left lower leg: No edema.  Skin:    General: Skin is warm and dry.  Neurological:     Mental Status: He is alert.     Sensory: No sensory deficit.     Motor: No weakness.     Gait: Gait normal.            Assessment & Plan:    See Problem List for Assessment and Plan of chronic medical problems.    This visit occurred during the SARS-CoV-2 public health emergency.  Safety protocols were in place, including screening questions prior to the visit, additional usage of staff PPE, and extensive cleaning of exam room while observing appropriate contact time as indicated for disinfecting solutions.

## 2019-11-17 ENCOUNTER — Ambulatory Visit (INDEPENDENT_AMBULATORY_CARE_PROVIDER_SITE_OTHER): Payer: Medicare Other | Admitting: Internal Medicine

## 2019-11-17 ENCOUNTER — Other Ambulatory Visit: Payer: Self-pay

## 2019-11-17 ENCOUNTER — Encounter: Payer: Self-pay | Admitting: Internal Medicine

## 2019-11-17 VITALS — BP 140/80 | HR 70 | Temp 98.2°F | Resp 16 | Ht 70.0 in | Wt 219.0 lb

## 2019-11-17 DIAGNOSIS — M5416 Radiculopathy, lumbar region: Secondary | ICD-10-CM

## 2019-11-17 DIAGNOSIS — R635 Abnormal weight gain: Secondary | ICD-10-CM | POA: Diagnosis not present

## 2019-11-17 MED ORDER — CYCLOBENZAPRINE HCL 10 MG PO TABS: 10.0000 mg | ORAL_TABLET | Freq: Three times a day (TID) | ORAL | 0 refills | Status: DC | PRN

## 2019-11-17 MED ORDER — MELOXICAM 15 MG PO TABS: 15.0000 mg | ORAL_TABLET | Freq: Every day | ORAL | 0 refills | Status: DC

## 2019-11-17 NOTE — Patient Instructions (Addendum)
Take meloxican 15 mg daily with food.  Do not take any advil while taking this.    Take the cyclobenzaprine 10 mg three times a day as needed for muscle tightness in your back.  This may cause dizziness.      Avoid activities that cause the pain to be worse.  Do back exercises.    Please call if there is no improvement in your symptoms.      Low Back Sprain or Strain Rehab Ask your health care provider which exercises are safe for you. Do exercises exactly as told by your health care provider and adjust them as directed. It is normal to feel mild stretching, pulling, tightness, or discomfort as you do these exercises. Stop right away if you feel sudden pain or your pain gets worse. Do not begin these exercises until told by your health care provider. Stretching and range-of-motion exercises These exercises warm up your muscles and joints and improve the movement and flexibility of your back. These exercises also help to relieve pain, numbness, and tingling. Lumbar rotation  1. Lie on your back on a firm surface and bend your knees. 2. Straighten your arms out to your sides so each arm forms a 90-degree angle (right angle) with a side of your body. 3. Slowly move (rotate) both of your knees to one side of your body until you feel a stretch in your lower back (lumbar). Try not to let your shoulders lift off the floor. 4. Hold this position for __________ seconds. 5. Tense your abdominal muscles and slowly move your knees back to the starting position. 6. Repeat this exercise on the other side of your body. Repeat __________ times. Complete this exercise __________ times a day. Single knee to chest  1. Lie on your back on a firm surface with both legs straight. 2. Bend one of your knees. Use your hands to move your knee up toward your chest until you feel a gentle stretch in your lower back and buttock. ? Hold your leg in this position by holding on to the front of your knee. ? Keep your  other leg as straight as possible. 3. Hold this position for __________ seconds. 4. Slowly return to the starting position. 5. Repeat with your other leg. Repeat __________ times. Complete this exercise __________ times a day. Prone extension on elbows  1. Lie on your abdomen on a firm surface (prone position). 2. Prop yourself up on your elbows. 3. Use your arms to help lift your chest up until you feel a gentle stretch in your abdomen and your lower back. ? This will place some of your body weight on your elbows. If this is uncomfortable, try stacking pillows under your chest. ? Your hips should stay down, against the surface that you are lying on. Keep your hip and back muscles relaxed. 4. Hold this position for __________ seconds. 5. Slowly relax your upper body and return to the starting position. Repeat __________ times. Complete this exercise __________ times a day. Strengthening exercises These exercises build strength and endurance in your back. Endurance is the ability to use your muscles for a long time, even after they get tired. Pelvic tilt This exercise strengthens the muscles that lie deep in the abdomen. 1. Lie on your back on a firm surface. Bend your knees and keep your feet flat on the floor. 2. Tense your abdominal muscles. Tip your pelvis up toward the ceiling and flatten your lower back into the floor. ?  To help with this exercise, you may place a small towel under your lower back and try to push your back into the towel. 3. Hold this position for __________ seconds. 4. Let your muscles relax completely before you repeat this exercise. Repeat __________ times. Complete this exercise __________ times a day. Alternating arm and leg raises  1. Get on your hands and knees on a firm surface. If you are on a hard floor, you may want to use padding, such as an exercise mat, to cushion your knees. 2. Line up your arms and legs. Your hands should be directly below your  shoulders, and your knees should be directly below your hips. 3. Lift your left leg behind you. At the same time, raise your right arm and straighten it in front of you. ? Do not lift your leg higher than your hip. ? Do not lift your arm higher than your shoulder. ? Keep your abdominal and back muscles tight. ? Keep your hips facing the ground. ? Do not arch your back. ? Keep your balance carefully, and do not hold your breath. 4. Hold this position for __________ seconds. 5. Slowly return to the starting position. 6. Repeat with your right leg and your left arm. Repeat __________ times. Complete this exercise __________ times a day. Abdominal set with straight leg raise  1. Lie on your back on a firm surface. 2. Bend one of your knees and keep your other leg straight. 3. Tense your abdominal muscles and lift your straight leg up, 4-6 inches (10-15 cm) off the ground. 4. Keep your abdominal muscles tight and hold this position for __________ seconds. ? Do not hold your breath. ? Do not arch your back. Keep it flat against the ground. 5. Keep your abdominal muscles tense as you slowly lower your leg back to the starting position. 6. Repeat with your other leg. Repeat __________ times. Complete this exercise __________ times a day. Single leg lower with bent knees 1. Lie on your back on a firm surface. 2. Tense your abdominal muscles and lift your feet off the floor, one foot at a time, so your knees and hips are bent in 90-degree angles (right angles). ? Your knees should be over your hips and your lower legs should be parallel to the floor. 3. Keeping your abdominal muscles tense and your knee bent, slowly lower one of your legs so your toe touches the ground. 4. Lift your leg back up to return to the starting position. ? Do not hold your breath. ? Do not let your back arch. Keep your back flat against the ground. 5. Repeat with your other leg. Repeat __________ times. Complete this  exercise __________ times a day. Posture and body mechanics Good posture and healthy body mechanics can help to relieve stress in your body's tissues and joints. Body mechanics refers to the movements and positions of your body while you do your daily activities. Posture is part of body mechanics. Good posture means:  Your spine is in its natural S-curve position (neutral).  Your shoulders are pulled back slightly.  Your head is not tipped forward. Follow these guidelines to improve your posture and body mechanics in your everyday activities. Standing   When standing, keep your spine neutral and your feet about hip width apart. Keep a slight bend in your knees. Your ears, shoulders, and hips should line up.  When you do a task in which you stand in one place for a long time,  place one foot up on a stable object that is 2-4 inches (5-10 cm) high, such as a footstool. This helps keep your spine neutral. Sitting   When sitting, keep your spine neutral and keep your feet flat on the floor. Use a footrest, if necessary, and keep your thighs parallel to the floor. Avoid rounding your shoulders, and avoid tilting your head forward.  When working at a desk or a computer, keep your desk at a height where your hands are slightly lower than your elbows. Slide your chair under your desk so you are close enough to maintain good posture.  When working at a computer, place your monitor at a height where you are looking straight ahead and you do not have to tilt your head forward or downward to look at the screen. Resting  When lying down and resting, avoid positions that are most painful for you.  If you have pain with activities such as sitting, bending, stooping, or squatting, lie in a position in which your body does not bend very much. For example, avoid curling up on your side with your arms and knees near your chest (fetal position).  If you have pain with activities such as standing for a long  time or reaching with your arms, lie with your spine in a neutral position and bend your knees slightly. Try the following positions: ? Lying on your side with a pillow between your knees. ? Lying on your back with a pillow under your knees. Lifting   When lifting objects, keep your feet at least shoulder width apart and tighten your abdominal muscles.  Bend your knees and hips and keep your spine neutral. It is important to lift using the strength of your legs, not your back. Do not lock your knees straight out.  Always ask for help to lift heavy or awkward objects. This information is not intended to replace advice given to you by your health care provider. Make sure you discuss any questions you have with your health care provider. Document Revised: 01/03/2019 Document Reviewed: 10/03/2018 Elsevier Patient Education  [place].

## 2019-11-17 NOTE — Assessment & Plan Note (Signed)
Has gained weight and wants to be on a weight loss medication Discussed what he is eating / drinking and he is consuming many more calories than he realizes Stressed not drinking any calories and stop snacking - increase fruits, veges Will restart regular exercise when able - when back is better

## 2019-11-17 NOTE — Assessment & Plan Note (Signed)
Lower back pain with right upper leg pain/tingling Will avoid gabapentin and steroids due to possible weight gain Start meloxicam 15 mg daily with food Flexeril 10 mg TID prn Back exercises Will call if no improvement

## 2019-11-24 ENCOUNTER — Ambulatory Visit: Payer: Medicare Other | Attending: Internal Medicine

## 2019-11-24 DIAGNOSIS — Z23 Encounter for immunization: Secondary | ICD-10-CM | POA: Insufficient documentation

## 2019-11-24 NOTE — Progress Notes (Signed)
   Covid-19 Vaccination Clinic  Name:  Todd Mendoza    MRN: [id number] DOB: [birth year]  11/24/2019  [name] was observed post Covid-19 immunization for 15 minutes without incidence. He was provided with Vaccine Information Sheet and instruction to access the V-Safe system.   [name] was instructed to call 911 with any severe reactions post vaccine: Marland Kitchen Difficulty breathing  . Swelling of your face and throat  . A fast heartbeat  . A bad rash all over your body  . Dizziness and weakness    Immunizations Administered    Name Date Dose VIS Date Route   Pfizer COVID-19 Vaccine 11/24/2019  8:16 AM 0.3 mL 09/05/2019 Intramuscular   Manufacturer: [place]   Lot: [id number]   [place]: [id number]

## 2019-12-10 ENCOUNTER — Other Ambulatory Visit: Payer: Self-pay | Admitting: Internal Medicine

## 2019-12-23 ENCOUNTER — Ambulatory Visit: Payer: Medicare Other | Attending: Internal Medicine

## 2019-12-23 DIAGNOSIS — Z23 Encounter for immunization: Secondary | ICD-10-CM

## 2019-12-23 NOTE — Progress Notes (Signed)
   Covid-19 Vaccination Clinic  Name:  Todd Mendoza    MRN: [id number] DOB: [birth year]  12/23/2019  Todd Mendoza was observed post Covid-19 immunization for 15 minutes without incident. He was provided with Vaccine Information Sheet and instruction to access the V-Safe system.   Todd Mendoza was instructed to call 911 with any severe reactions post vaccine: Marland Kitchen Difficulty breathing  . Swelling of face and throat  . A fast heartbeat  . A bad rash all over body  . Dizziness and weakness   Immunizations Administered    Name Date Dose VIS Date Route   Pfizer COVID-19 Vaccine 12/23/2019  8:25 AM 0.3 mL 09/05/2019 Intramuscular   Manufacturer: [place]   Lot: [id number]   [place]: [id number]

## 2020-01-10 DIAGNOSIS — Z79899 Other long term (current) drug therapy: Secondary | ICD-10-CM | POA: Diagnosis not present

## 2020-01-10 DIAGNOSIS — Z1159 Encounter for screening for other viral diseases: Secondary | ICD-10-CM | POA: Diagnosis not present

## 2020-03-24 ENCOUNTER — Other Ambulatory Visit: Payer: Self-pay | Admitting: Internal Medicine

## 2020-03-24 DIAGNOSIS — I10 Essential (primary) hypertension: Secondary | ICD-10-CM

## 2020-03-25 ENCOUNTER — Ambulatory Visit (INDEPENDENT_AMBULATORY_CARE_PROVIDER_SITE_OTHER): Payer: Medicare Other

## 2020-03-25 ENCOUNTER — Other Ambulatory Visit: Payer: Self-pay

## 2020-03-25 VITALS — BP 130/80 | HR 71 | Temp 98.1°F | Resp 16 | Ht 70.0 in | Wt 209.0 lb

## 2020-03-25 DIAGNOSIS — Z0000 Encounter for general adult medical examination without abnormal findings: Secondary | ICD-10-CM

## 2020-03-25 NOTE — Progress Notes (Signed)
Subjective:   Todd Mendoza is a 69 y.o. male who presents for Medicare Annual/Subsequent preventive examination.  Review of Systems    No ROS. Medicare Wellness Visit Cardiac Risk Factors include: advanced age (>61men, >25 women);dyslipidemia;hypertension;male gender     Objective:    Today's Vitals   03/25/20 1030  BP: 130/80  Pulse: 71  Resp: 16  Temp: 98.1 F (36.7 C)  SpO2: 93%  Weight: 209 lb (94.8 kg)  Height: 5\' 10"  (1.778 m)  PainSc: 0-No pain   Body mass index is 29.99 kg/m.  Advanced Directives 03/25/2020 03/25/2019 10/07/2018 09/05/2018 03/19/2018 04/02/2017 03/15/2017  Does Patient Have a Medical Advance Directive? Yes Yes Yes No Yes No No  Type of Advance Directive - [place];Living will Healthcare Power of [place];Living will - -  Does patient want to make changes to medical advance directive? No - Patient declined - No - Patient declined - - - -  Copy of [place] in Chart? - No - copy requested No - copy requested - No - copy requested - -  Would patient like information on creating a medical advance directive? - - - No - Patient declined - No - Patient declined Yes (ED - Information included in AVS)    Current Medications (verified) Outpatient Encounter Medications as of 03/25/2020  Medication Sig  . aspirin EC 81 MG tablet Take 1 tablet (81 mg total) by mouth daily.  Marland Kitchen atorvastatin (LIPITOR) 20 MG tablet TAKE 1 TABLET BY MOUTH EVERY DAY  . hydrochlorothiazide (HYDRODIURIL) 25 MG tablet TAKE 1 TABLET BY MOUTH EVERY DAY  . [DISCONTINUED] albuterol (PROVENTIL HFA;VENTOLIN HFA) 108 (90 Base) MCG/ACT inhaler Inhale 1-2 puffs into the lungs every 6 (six) hours as needed for wheezing or shortness of breath.  . [DISCONTINUED] cyclobenzaprine (FLEXERIL) 10 MG tablet Take 1 tablet (10 mg total) by mouth 3 (three) times daily as needed for muscle spasms.  . [DISCONTINUED] meloxicam (MOBIC) 15 MG tablet  TAKE 1 TABLET BY MOUTH EVERY DAY WITH FOOD  . [DISCONTINUED] Sofosbuvir-Velpatasvir 400-100 MG TABS    No facility-administered encounter medications on file as of 03/25/2020.    Allergies (verified) Patient has no known allergies.   History: Past Medical History:  Diagnosis Date  . Hyperlipidemia   . Hypertension   . Stroke ([place])   . Vertigo    Past Surgical History:  Procedure Laterality Date  . NO PAST SURGERIES     Family History  Problem Relation Age of Onset  . Diabetes Father   . Colon cancer Neg Hx   . Colon polyps Neg Hx   . Esophageal cancer Neg Hx   . Stomach cancer Neg Hx   . Rectal cancer Neg Hx    Social History   Socioeconomic History  . Marital status: Married    Spouse name: Not on file  . Number of children: Not on file  . Years of education: Not on file  . Highest education level: Not on file  Occupational History  . 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 Smoking status: Former Smoker    Packs/day: 0.20    Types: Cigarettes  . Smokeless tobacco: Never Used  Vaping Use  . Vaping Use: Never used  Substance and Sexual Activity  . Alcohol use: No    Alcohol/week: 0.0 standard drinks  . Drug use: No  . Sexual activity: Yes  Other Topics Concern  . Not  on file  Social History Narrative   Married   Psychologist, counselling daily   Social Determinants of Health   Financial Resource Strain: Low Risk   . Difficulty of Paying Living Expenses: Not hard at all  Food Insecurity: No Food Insecurity  . Worried About Charity fundraiser in the Last Year: Never true  . Ran Out of Food in the Last Year: Never true  Transportation Needs: No Transportation Needs  . Lack of Transportation (Medical): No  . Lack of Transportation (Non-Medical): No  Physical Activity: Sufficiently Active  . Days of Exercise per Week: 5 days  . Minutes of Exercise per Session: 30 min  Stress: No Stress Concern Present  . Feeling of Stress : Not at all  Social Connections:  Socially Integrated  . Frequency of Communication with Friends and Family: More than three times a week  . Frequency of Social Gatherings with Friends and Family: More than three times a week  . Attends Religious Services: More than 4 times per year  . Active Member of Clubs or Organizations: Yes  . Attends Archivist Meetings: More than 4 times per year  . Marital Status: Married    Tobacco Counseling Counseling given: No   Clinical Intake:  Pre-visit preparation completed: Yes  Pain : No/denies pain Pain Score: 0-No pain     BMI - recorded: 29.99 Nutritional Status: BMI 25 -29 Overweight Nutritional Risks: None Diabetes: No  How often do you need to have someone help you when you read instructions, pamphlets, or other written materials from your doctor or pharmacy?: 1 - Never What is the last grade level you completed in school?: High School Graduate  Diabetic? no  Interpreter Needed?: No  Information entered by :: [name], LPN   Activities of Daily Living In your present state of health, do you have any difficulty performing the following activities: 03/25/2020  Hearing? N  Vision? N  Difficulty concentrating or making decisions? N  Walking or climbing stairs? N  Dressing or bathing? N  Doing errands, shopping? N  Preparing Food and eating ? N  Using the Toilet? N  In the past six months, have you accidently leaked urine? N  Do you have problems with loss of bowel control? N  Managing your Medications? N  Managing your Finances? N  Housekeeping or managing your Housekeeping? N  Some recent data might be hidden    Patient Care Team: Binnie Rail, MD as PCP - General (Internal Medicine)  Indicate any recent Medical Services you may have received from other than [facility] providers in the past year (date may be approximate).     Assessment:   This is a routine wellness examination for Todd Mendoza.  Hearing/Vision screen No exam data  present  Dietary issues and exercise activities discussed: Current Exercise Habits: Home exercise routine, Type of exercise: strength training/weights;walking (sit ups), Time (Minutes): 30, Frequency (Times/Week): 5, Weekly Exercise (Minutes/Week): 150, Intensity: Moderate, Exercise limited by: None identified  Goals    .  Patient Stated      I want to increase my physical activity by starting to go back to the [facility] twice weekly. Watch my diet for fat and salt. 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     .  Patient Stated (pt-stated)      To  lose 25-30 pounds    .  Weight (lb) < 200 lb (90.7  kg)      Continue to exercise and eat as healthy as possible      Depression Screen PHQ 2/9 Scores 03/25/2020 03/25/2019 10/09/2018 03/19/2018 01/08/2018 03/15/2017 03/14/2016  PHQ - 2 Score 0 0 0 0 0 0 0    Fall Risk Fall Risk  03/25/2020 03/25/2019 03/19/2018 01/08/2018 03/15/2017  Falls in the past year? 0 0 No No No  Number falls in past yr: 0 - - - -  Injury with Fall? 0 - - - -  Risk for fall due to : No Fall Risks - - - -  Follow up Falls evaluation completed;Education provided - - - -    Any stairs in or around the home? No  If so, are there any without handrails? No  Home free of loose throw rugs in walkways, pet beds, electrical cords, etc? Yes  Adequate lighting in your home to reduce risk of falls? Yes   ASSISTIVE DEVICES UTILIZED TO PREVENT FALLS:  Life alert? No  Use of a cane, walker or w/c? No  Grab bars in the bathroom? No  Shower chair or bench in shower? No  Elevated toilet seat or a handicapped toilet? No   TIMED UP AND GO:  Was the test performed? No .  Length of time to ambulate 10 feet: 0 sec.   Gait steady and fast without use of assistive device  Cognitive Function:        Immunizations Immunization History  Administered Date(s) Administered  . Fluad Quad(high Dose 65+) 06/24/2019  . Influenza, High  Dose Seasonal PF 09/17/2018  . PFIZER SARS-COV-2 Vaccination 11/24/2019, 12/23/2019  . Pneumococcal Conjugate-13 04/11/2016  . Pneumococcal Polysaccharide-23 05/15/2017  . Tdap 04/02/2017    TDAP status: Up to date Flu Vaccine status: Up to date Pneumococcal vaccine status: Up to date Covid-19 vaccine status: Completed vaccines  Qualifies for Shingles Vaccine? Yes   Zostavax completed No   Shingrix Completed?: No.    Education has been provided regarding the importance of this vaccine. Patient has been advised to call insurance company to determine out of pocket expense if they have not yet received this vaccine. Advised may also receive vaccine at local pharmacy or Health Dept. Verbalized acceptance and understanding.  Screening Tests Health Maintenance  Topic Date Due  . INFLUENZA VACCINE  04/25/2020  . COLONOSCOPY  07/31/2021  . TETANUS/TDAP  04/03/2027  . COVID-19 Vaccine  Completed  . Hepatitis C Screening  Completed  . PNA vac Low Risk Adult  Completed    Health Maintenance  There are no preventive care reminders to display for this patient.  Colorectal cancer screening: Completed 07/31/2018. Repeat every 3 years  Lung Cancer Screening: (Low Dose CT Chest recommended if Age 45-80 years, 30 pack-year currently smoking OR have quit w/in 15years.) does not qualify.   Lung Cancer Screening Referral: no  Additional Screening:  Hepatitis C Screening: does qualify; Completed yes  Vision Screening: Recommended annual ophthalmology exams for early detection of glaucoma and other disorders of the eye. Is the patient up to date with their annual eye exam?  No  Who is the provider or what is the name of the office in which the patient attends annual eye exams? No eye doctor; patient refused referral If pt is not established with a provider, would they like to be referred to a provider to establish care? No .   Dental Screening: Recommended annual dental exams for proper oral  hygiene  Community Resource Referral / Chronic  Care Management: CRR required this visit?  No   CCM required this visit?  No      Plan:     I have personally reviewed and noted the following in the patient's chart:   . Medical and social history . Use of alcohol, tobacco or illicit drugs  . Current medications and supplements . Functional ability and status . Nutritional status . Physical activity . Advanced directives . List of other physicians . Hospitalizations, surgeries, and ER visits in previous 12 months . Vitals . Screenings to include cognitive, depression, and falls . Referrals and appointments  In addition, I have reviewed and discussed with patient certain preventive protocols, quality metrics, and best practice recommendations. A written personalized care plan for preventive services as well as general preventive health recommendations were provided to patient.     Sheral Flow, LPN   05/01/5796   Nurse Notes: Patient is cogitatively intact.

## 2020-03-25 NOTE — Patient Instructions (Addendum)
Todd Mendoza , Thank you for taking time to come for your Medicare Wellness Visit. I appreciate your ongoing commitment to your health goals. Please review the following plan we discussed and let me know if I can assist you in the future.   Screening recommendations/referrals: Colonoscopy: 07/31/2018; due every 3 years Recommended yearly ophthalmology/optometry visit for glaucoma screening and checkup Recommended yearly dental visit for hygiene and checkup  Vaccinations: Influenza vaccine: 06/24/2019 Pneumococcal vaccine: completed Tdap vaccine: 04/02/2017; due every 10 years Shingles vaccine: never done   Covid-19: completed  Advanced directives: Please bring a copy of your health care power of attorney and living will to the office at your convenience.  Conditions/risks identified: Please continue to do your personal lifestyle choices by: daily care of teeth and gums, regular physical activity (goal should be 5 days a week for 30 minutes), eat a healthy diet, avoid tobacco and drug use, limiting any alcohol intake, taking a low-dose aspirin (if not allergic or have been advised by your provider otherwise) and taking vitamins and minerals as recommended by your provider. Continue doing brain stimulating activities (puzzles, reading, adult coloring books, staying active) to keep memory sharp. Continue to eat heart healthy diet (full of fruits, vegetables, whole grains, lean protein, water--limit salt, fat, and sugar intake) and increase physical activity as tolerated.  Next appointment: Please schedule your next Medicare Wellness Visit with your Nurse Health Advisor in 1 year.  Preventive Care 70 Years and Older, Male Preventive care refers to lifestyle choices and visits with your health care provider that can promote health and wellness. What does preventive care include?  A yearly physical exam. This is also called an annual well check.  Dental exams once or twice a year.  Routine eye  exams. Ask your health care provider how often you should have your eyes checked.  Personal lifestyle choices, including:  Daily care of your teeth and gums.  Regular physical activity.  Eating a healthy diet.  Avoiding tobacco and drug use.  Limiting alcohol use.  Practicing safe sex.  Taking low doses of aspirin every day.  Taking vitamin and mineral supplements as recommended by your health care provider. What happens during an annual well check? The services and screenings done by your health care provider during your annual well check will depend on your age, overall health, lifestyle risk factors, and family history of disease. Counseling  Your health care provider may ask you questions about your:  Alcohol use.  Tobacco use.  Drug use.  Emotional well-being.  Home and relationship well-being.  Sexual activity.  Eating habits.  History of falls.  Memory and ability to understand (cognition).  Work and work Statistician. Screening  You may have the following tests or measurements:  Height, weight, and BMI.  Blood pressure.  Lipid and cholesterol levels. These may be checked every 5 years, or more frequently if you are over 27 years old.  Skin check.  Lung cancer screening. You may have this screening every year starting at age 28 if you have a 30-pack-year history of smoking and currently smoke or have quit within the past 15 years.  Fecal occult blood test (FOBT) of the stool. You may have this test every year starting at age 19.  Flexible sigmoidoscopy or colonoscopy. You may have a sigmoidoscopy every 5 years or a colonoscopy every 10 years starting at age 10.  Prostate cancer screening. Recommendations will vary depending on your family history and other risks.  Hepatitis C blood  test.  Hepatitis B blood test.  Sexually transmitted disease (STD) testing.  Diabetes screening. This is done by checking your blood sugar (glucose) after you have  not eaten for a while (fasting). You may have this done every 1-3 years.  Abdominal aortic aneurysm (AAA) screening. You may need this if you are a current or former smoker.  Osteoporosis. You may be screened starting at age 51 if you are at high risk. Talk with your health care provider about your test results, treatment options, and if necessary, the need for more tests. Vaccines  Your health care provider may recommend certain vaccines, such as:  Influenza vaccine. This is recommended every year.  Tetanus, diphtheria, and acellular pertussis (Tdap, Td) vaccine. You may need a Td booster every 10 years.  Zoster vaccine. You may need this after age 88.  Pneumococcal 13-valent conjugate (PCV13) vaccine. One dose is recommended after age 37.  Pneumococcal polysaccharide (PPSV23) vaccine. One dose is recommended after age 58. Talk to your health care provider about which screenings and vaccines you need and how often you need them. This information is not intended to replace advice given to you by your health care provider. Make sure you discuss any questions you have with your health care provider. Document Released: 10/08/2015 Document Revised: 05/31/2016 Document Reviewed: 07/13/2015 Elsevier Interactive Patient Education  2017 [employer] Prevention in the Home Falls can cause injuries. They can happen to people of all ages. There are many things you can do to make your home safe and to help prevent falls. What can I do on the outside of my home?  Regularly fix the edges of walkways and driveways and fix any cracks.  Remove anything that might make you trip as you walk through a door, such as a raised step or threshold.  Trim any bushes or trees on the path to your home.  Use bright outdoor lighting.  Clear any walking paths of anything that might make someone trip, such as rocks or tools.  Regularly check to see if handrails are loose or broken. Make sure that both  sides of any steps have handrails.  Any raised decks and porches should have guardrails on the edges.  Have any leaves, snow, or ice cleared regularly.  Use sand or salt on walking paths during winter.  Clean up any spills in your garage right away. This includes oil or grease spills. What can I do in the bathroom?  Use night lights.  Install grab bars by the toilet and in the tub and shower. Do not use towel bars as grab bars.  Use non-skid mats or decals in the tub or shower.  If you need to sit down in the shower, use a plastic, non-slip stool.  Keep the floor dry. Clean up any water that spills on the floor as soon as it happens.  Remove soap buildup in the tub or shower regularly.  Attach bath mats securely with double-sided non-slip rug tape.  Do not have throw rugs and other things on the floor that can make you trip. What can I do in the bedroom?  Use night lights.  Make sure that you have a light by your bed that is easy to reach.  Do not use any sheets or blankets that are too big for your bed. They should not hang down onto the floor.  Have a firm chair that has side arms. You can use this for support while you get dressed.  Do not have throw rugs and other things on the floor that can make you trip. What can I do in the kitchen?  Clean up any spills right away.  Avoid walking on wet floors.  Keep items that you use a lot in easy-to-reach places.  If you need to reach something above you, use a strong step stool that has a grab bar.  Keep electrical cords out of the way.  Do not use floor polish or wax that makes floors slippery. If you must use wax, use non-skid floor wax.  Do not have throw rugs and other things on the floor that can make you trip. What can I do with my stairs?  Do not leave any items on the stairs.  Make sure that there are handrails on both sides of the stairs and use them. Fix handrails that are broken or loose. Make sure that  handrails are as long as the stairways.  Check any carpeting to make sure that it is firmly attached to the stairs. Fix any carpet that is loose or worn.  Avoid having throw rugs at the top or bottom of the stairs. If you do have throw rugs, attach them to the floor with carpet tape.  Make sure that you have a light switch at the top of the stairs and the bottom of the stairs. If you do not have them, ask someone to add them for you. What else can I do to help prevent falls?  Wear shoes that:  Do not have high heels.  Have rubber bottoms.  Are comfortable and fit you well.  Are closed at the toe. Do not wear sandals.  If you use a stepladder:  Make sure that it is fully opened. Do not climb a closed stepladder.  Make sure that both sides of the stepladder are locked into place.  Ask someone to hold it for you, if possible.  Clearly mark and make sure that you can see:  Any grab bars or handrails.  First and last steps.  Where the edge of each step is.  Use tools that help you move around (mobility aids) if they are needed. These include:  Canes.  Walkers.  Scooters.  Crutches.  Turn on the lights when you go into a dark area. Replace any light bulbs as soon as they burn out.  Set up your furniture so you have a clear path. Avoid moving your furniture around.  If any of your floors are uneven, fix them.  If there are any pets around you, be aware of where they are.  Review your medicines with your doctor. Some medicines can make you feel dizzy. This can increase your chance of falling. Ask your doctor what other things that you can do to help prevent falls. This information is not intended to replace advice given to you by your health care provider. Make sure you discuss any questions you have with your health care provider. Document Released: 07/08/2009 Document Revised: 02/17/2016 Document Reviewed: 10/16/2014 Elsevier Interactive Patient Education  2017  [employer].

## 2020-04-03 DIAGNOSIS — Z1159 Encounter for screening for other viral diseases: Secondary | ICD-10-CM | POA: Diagnosis not present

## 2020-04-03 DIAGNOSIS — R945 Abnormal results of liver function studies: Secondary | ICD-10-CM | POA: Diagnosis not present

## 2020-06-11 ENCOUNTER — Ambulatory Visit: Payer: Medicare Other

## 2020-06-20 NOTE — Progress Notes (Signed)
Subjective:    Patient ID: Todd Mendoza, male    DOB: 1951/02/06, 69 y.o.   MRN: [id number]  HPI The patient is here for an acute visit.  H/o lumbar radiculopathy in RLE. He was here 10/2019 for this and took meloxicam and flexeril.  He was advised to do back exercises.  The above medication did not help.  He still has pain in his low back and goes down his leg.  When he tries to exercise-walk or do other exercises it makes his pain worse.  He is frustrated because he is working on weight loss and is limited in his physical activity.  He is also concerned that weight is causing some of the back issues.  He does wonder about possible weight loss medication.  At this point is not taking anything for the lumbar radiculopathy.  He is trying to keep active and eat healthy and watch his portions.  He has lost weight over the past 6 months.  His blood pressure is elevated here today and he is concerned about that.  He is taking his medication daily.     Medications and allergies reviewed with patient and updated if appropriate.  Patient Active Problem List   Diagnosis Date Noted  . Frequent headaches 10/13/2019  . Hyperglycemia 10/11/2019  . Hepatitis C antibody test positive 06/26/2019  . DOE (dyspnea on exertion) 02/13/2019  . Overweight (BMI 25.0-29.9) 02/13/2019  . Anemia 04/30/2018  . ED (erectile dysfunction) 11/03/2016  . Lumbar radiculopathy 06/30/2016  . Family history of diabetes mellitus 03/14/2016  . Encounter for prostate cancer screening 03/14/2016  . H/O: CVA (cerebrovascular accident) 12/26/2012  . HTN (hypertension) 12/26/2012  . HLD (hyperlipidemia) 12/26/2012  . Allergic rhinitis 12/26/2012    Current Outpatient Medications on File Prior to Visit  Medication Sig Dispense Refill  . aspirin EC 81 MG tablet Take 1 tablet (81 mg total) by mouth daily. 30 tablet 0  . atorvastatin (LIPITOR) 20 MG tablet TAKE 1 TABLET BY MOUTH EVERY DAY 90 tablet 3  .  hydrochlorothiazide (HYDRODIURIL) 25 MG tablet TAKE 1 TABLET BY MOUTH EVERY DAY 90 tablet 1   No current facility-administered medications on file prior to visit.    Past Medical History:  Diagnosis Date  . Hyperlipidemia   . Hypertension   . Stroke ([place])   . Vertigo     Past Surgical History:  Procedure Laterality Date  . NO PAST SURGERIES      Social History   Socioeconomic History  . Marital status: Married    Spouse name: Not on file  . Number of children: Not on file  . Years of education: Not on file  . Highest education level: Not on file  Occupational History  . 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 Smoking status: Former Smoker    Packs/day: 0.20    Types: Cigarettes  . Smokeless tobacco: Never Used  Vaping Use  . Vaping Use: Never used  Substance and Sexual Activity  . Alcohol use: No    Alcohol/week: 0.0 standard drinks  . Drug use: No  . Sexual activity: Yes  Other Topics Concern  . Not on file  Social History Narrative   Married   Psychologist, counselling daily   Social Determinants of Health   Financial Resource Strain: Low Risk   . Difficulty of Paying Living Expenses: Not hard at all  Food Insecurity: No Food Insecurity  . Worried About Charity fundraiser in the  Last Year: Never true  . Ran Out of Food in the Last Year: Never true  Transportation Needs: No Transportation Needs  . Lack of Transportation (Medical): No  . Lack of Transportation (Non-Medical): No  Physical Activity: Sufficiently Active  . Days of Exercise per Week: 5 days  . Minutes of Exercise per Session: 30 min  Stress: No Stress Concern Present  . Feeling of Stress : Not at all  Social Connections: Socially Integrated  . Frequency of Communication with Friends and Family: More than three times a week  . Frequency of Social Gatherings with Friends and Family: More than three times a week  . Attends Religious Services: More than 4 times per year  . Active Member of Clubs or  Organizations: Yes  . Attends Archivist Meetings: More than 4 times per year  . Marital Status: Married    Family History  Problem Relation Age of Onset  . Diabetes Father   . Colon cancer Neg Hx   . Colon polyps Neg Hx   . Esophageal cancer Neg Hx   . Stomach cancer Neg Hx   . Rectal cancer Neg Hx     Review of Systems  Constitutional: Negative for fever.  Respiratory: Positive for cough (occ). Negative for shortness of breath and wheezing.   Cardiovascular: Negative for chest pain, palpitations and leg swelling.  Musculoskeletal: Positive for back pain.  Neurological: Negative for light-headedness and headaches.       Objective:   Vitals:   06/21/20 0915  BP: 140/82  Pulse: 62  Temp: 98.5 F (36.9 C)  SpO2: 97%   BP Readings from Last 3 Encounters:  06/21/20 140/82  03/25/20 130/80  11/17/19 140/80   Wt Readings from Last 3 Encounters:  06/21/20 205 lb (93 kg)  03/25/20 209 lb (94.8 kg)  11/17/19 219 lb (99.3 kg)   Body mass index is 29.41 kg/m.   Physical Exam    Constitutional: Appears well-developed and well-nourished. No distress.  Head: Normocephalic and atraumatic.  Neck: Neck supple. No tracheal deviation present. No thyromegaly present.  No cervical lymphadenopathy Cardiovascular: Normal rate, regular rhythm and normal heart sounds.  No murmur heard.  No edema Pulmonary/Chest: Effort normal and breath sounds normal. No respiratory distress. No has no wheezes. No rales. Musculoskeletal: No weakness or decrease sensation in right leg, gait normal Skin: Skin is warm and dry. Not diaphoretic.  Psychiatric: Normal mood and affect. Behavior is normal.       Assessment & Plan:    See Problem List for Assessment and Plan of chronic medical problems.    This visit occurred during the SARS-CoV-2 public health emergency.  Safety protocols were in place, including screening questions prior to the visit, additional usage of staff PPE, and  extensive cleaning of exam room while observing appropriate contact time as indicated for disinfecting solutions.

## 2020-06-21 ENCOUNTER — Encounter: Payer: Self-pay | Admitting: Internal Medicine

## 2020-06-21 ENCOUNTER — Ambulatory Visit: Payer: Medicare Other

## 2020-06-21 ENCOUNTER — Ambulatory Visit (INDEPENDENT_AMBULATORY_CARE_PROVIDER_SITE_OTHER): Payer: Medicare Other | Admitting: Internal Medicine

## 2020-06-21 ENCOUNTER — Other Ambulatory Visit: Payer: Self-pay

## 2020-06-21 VITALS — BP 140/82 | HR 62 | Temp 98.5°F | Wt 205.0 lb

## 2020-06-21 DIAGNOSIS — M5416 Radiculopathy, lumbar region: Secondary | ICD-10-CM | POA: Diagnosis not present

## 2020-06-21 DIAGNOSIS — Z23 Encounter for immunization: Secondary | ICD-10-CM | POA: Diagnosis not present

## 2020-06-21 DIAGNOSIS — I10 Essential (primary) hypertension: Secondary | ICD-10-CM | POA: Diagnosis not present

## 2020-06-21 DIAGNOSIS — E663 Overweight: Secondary | ICD-10-CM

## 2020-06-21 MED ORDER — AMLODIPINE BESYLATE 5 MG PO TABS: 5.0000 mg | ORAL_TABLET | Freq: Every day | ORAL | 3 refills | Status: DC

## 2020-06-21 MED ORDER — GABAPENTIN 300 MG PO CAPS: 300.0000 mg | ORAL_CAPSULE | Freq: Every day | ORAL | 5 refills | Status: DC

## 2020-06-21 NOTE — Assessment & Plan Note (Signed)
Chronic Since the beginning of the year he has lost 14 pounds Discussed weight loss medication-given his blood pressure being elevated and his age age some medications are contraindicated and others are not covered by his insurance Discussed that he is losing weight and should continue doing what he is doing-it is a slow process Continue as much exercise as tolerated Continue to decrease portions, healthy diet

## 2020-06-21 NOTE — Patient Instructions (Signed)
   Medications reviewed and updated.  Changes include :  Gabapentin 300 mg at night.  Amlodipine 5 mg daily for your blood pressure.     Your prescription(s) have been submitted to your pharmacy. Please take as directed and contact our office if you believe you are having problem(s) with the medication(s).

## 2020-06-21 NOTE — Assessment & Plan Note (Signed)
Chronic Not ideally controlled Continue hydrochlorothiazide 25 mg daily Add amlodipine 5 mg daily He is working on weight loss Stressed low-sodium diet and regular exercise

## 2020-06-21 NOTE — Assessment & Plan Note (Signed)
Chronic Has had lumbar radiculopathy to right lower extremity for months Meloxicam and Flexeril were not effective Discussed referral to physical therapy or sports medicine-he declined at this time We will try gabapentin 300 mg at bedtime He is working on weight loss Activity as tolerated

## 2020-06-25 ENCOUNTER — Telehealth: Payer: Self-pay | Admitting: Internal Medicine

## 2020-06-25 NOTE — Telephone Encounter (Signed)
Patient called and was wondering if he could get an increase on gabapentin (NEURONTIN) 300 MG capsule  He said that it is not working for him    Please call the patient back at [phone]

## 2020-06-26 ENCOUNTER — Encounter: Payer: Self-pay | Admitting: Internal Medicine

## 2020-06-26 MED ORDER — GABAPENTIN 300 MG PO CAPS: 300.0000 mg | ORAL_CAPSULE | Freq: Every day | ORAL | 5 refills | Status: DC

## 2020-06-26 NOTE — Telephone Encounter (Signed)
Message sent via MyChart-can increase to 2 pills at night.  If this is too much then we can decrease it slightly.

## 2020-09-29 ENCOUNTER — Telehealth: Payer: Self-pay | Admitting: Internal Medicine

## 2020-09-29 NOTE — Telephone Encounter (Signed)
Ok with me 

## 2020-09-29 NOTE — Telephone Encounter (Signed)
Jiyaan Steinhauser called on behalf of her husband Case. Mr. Hammerschmidt would like to transfer his care to another Provider either here in this office, or another [facility] practice.  Please advise if this is okay  [phone]

## 2020-10-04 ENCOUNTER — Encounter: Payer: Self-pay | Admitting: Internal Medicine

## 2020-10-04 ENCOUNTER — Other Ambulatory Visit: Payer: Self-pay

## 2020-10-04 ENCOUNTER — Ambulatory Visit (INDEPENDENT_AMBULATORY_CARE_PROVIDER_SITE_OTHER): Payer: PPO | Admitting: Internal Medicine

## 2020-10-04 VITALS — BP 138/82 | HR 65 | Temp 98.2°F | Ht 70.0 in | Wt 202.0 lb

## 2020-10-04 DIAGNOSIS — G8929 Other chronic pain: Secondary | ICD-10-CM

## 2020-10-04 DIAGNOSIS — M25511 Pain in right shoulder: Secondary | ICD-10-CM | POA: Diagnosis not present

## 2020-10-04 NOTE — Progress Notes (Signed)
Subjective:    Patient ID: Todd Mendoza, male    DOB: 1950-11-04, 70 y.o.   MRN: [id number]  HPI The patient is here for an acute visit.  Right arm pain x months.  It is in the right shoulder and runs down his upper arm.  He states the medication that I have given in the past were not effective.  He states he is not here to be examined just to get a referral for a rheumatologist because he feels he has rheumatoid arthritis.  He denies any other joint pain.  On occasion he will feel his back pain and leg pain, but currently does not have that pain.  He denies joint swelling.     Medications and allergies reviewed with patient and updated if appropriate.  Patient Active Problem List   Diagnosis Date Noted  . Frequent headaches 10/13/2019  . Hyperglycemia 10/11/2019  . Hepatitis C antibody test positive 06/26/2019  . DOE (dyspnea on exertion) 02/13/2019  . Overweight (BMI 25.0-29.9) 02/13/2019  . Anemia 04/30/2018  . ED (erectile dysfunction) 11/03/2016  . Lumbar radiculopathy 06/30/2016  . Family history of diabetes mellitus 03/14/2016  . Encounter for prostate cancer screening 03/14/2016  . H/O: CVA (cerebrovascular accident) 12/26/2012  . HTN (hypertension) 12/26/2012  . HLD (hyperlipidemia) 12/26/2012  . Allergic rhinitis 12/26/2012    Current Outpatient Medications on File Prior to Visit  Medication Sig Dispense Refill  . amLODipine (NORVASC) 5 MG tablet Take 1 tablet (5 mg total) by mouth daily. 90 tablet 3  . aspirin EC 81 MG tablet Take 1 tablet (81 mg total) by mouth daily. 30 tablet 0  . atorvastatin (LIPITOR) 20 MG tablet TAKE 1 TABLET BY MOUTH EVERY DAY 90 tablet 3  . gabapentin (NEURONTIN) 300 MG capsule Take 1-2 capsules (300-600 mg total) by mouth at bedtime. 60 capsule 5  . hydrochlorothiazide (HYDRODIURIL) 25 MG tablet TAKE 1 TABLET BY MOUTH EVERY DAY 90 tablet 1  . meloxicam (MOBIC) 15 MG tablet Take 15 mg by mouth daily. (Patient not taking: Reported on  10/04/2020)     No current facility-administered medications on file prior to visit.    Past Medical History:  Diagnosis Date  . Hyperlipidemia   . Hypertension   . Stroke ([place])   . Vertigo     Past Surgical History:  Procedure Laterality Date  . NO PAST SURGERIES      Social History   Socioeconomic History  . Marital status: Married    Spouse name: Not on file  . Number of children: Not on file  . Years of education: Not on file  . Highest education level: Not on file  Occupational History  . 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 Smoking status: Former Smoker    Packs/day: 0.20    Types: Cigarettes  . Smokeless tobacco: Never Used  Vaping Use  . Vaping Use: Never used  Substance and Sexual Activity  . Alcohol use: No    Alcohol/week: 0.0 standard drinks  . Drug use: No  . Sexual activity: Yes  Other Topics Concern  . Not on file  Social History Narrative   Married   Psychologist, counselling daily   Social Determinants of Health   Financial Resource Strain: Low Risk   . Difficulty of Paying Living Expenses: Not hard at all  Food Insecurity: No Food Insecurity  . Worried About Charity fundraiser in the Last Year: Never true  . Ran Out of  Food in the Last Year: Never true  Transportation Needs: No Transportation Needs  . Lack of Transportation (Medical): No  . Lack of Transportation (Non-Medical): No  Physical Activity: Sufficiently Active  . Days of Exercise per Week: 5 days  . Minutes of Exercise per Session: 30 min  Stress: No Stress Concern Present  . Feeling of Stress : Not at all  Social Connections: Socially Integrated  . Frequency of Communication with Friends and Family: More than three times a week  . Frequency of Social Gatherings with Friends and Family: More than three times a week  . Attends Religious Services: More than 4 times per year  . Active Member of Clubs or Organizations: Yes  . Attends Archivist Meetings: More than 4 times per  year  . Marital Status: Married    Family History  Problem Relation Age of Onset  . Diabetes Father   . Colon cancer Neg Hx   . Colon polyps Neg Hx   . Esophageal cancer Neg Hx   . Stomach cancer Neg Hx   . Rectal cancer Neg Hx     Review of Systems     Objective:   Vitals:   10/04/20 1511  BP: 138/82  Pulse: 65  Temp: 98.2 F (36.8 C)  SpO2: 98%   BP Readings from Last 3 Encounters:  10/04/20 138/82  06/21/20 140/82  03/25/20 130/80   Wt Readings from Last 3 Encounters:  10/04/20 202 lb (91.6 kg)  06/21/20 205 lb (93 kg)  03/25/20 209 lb (94.8 kg)   Body mass index is 28.98 kg/m.   Physical Exam         Assessment & Plan:    See Problem List for Assessment and Plan of chronic medical problems.      This visit occurred during the SARS-CoV-2 public health emergency.  Safety protocols were in place, including screening questions prior to the visit, additional usage of staff PPE, and extensive cleaning of exam room while observing appropriate contact time as indicated for disinfecting solutions.

## 2020-10-04 NOTE — Patient Instructions (Addendum)
   A referral was ordered for orthopedics - [facility].    Someone from their office will call you to schedule an appointment.

## 2020-10-04 NOTE — Assessment & Plan Note (Signed)
Chronic He is here today for right shoulder pain that he has had for months No improvement with gabapentin, meloxicam Initially was here only to be referred to a rheumatologist because he thought he had rheumatoid arthritis-discussed that this is new not consistent with rheumatoid arthritis Recommended orthopedic evaluation and referred today

## 2020-10-08 ENCOUNTER — Other Ambulatory Visit: Payer: Self-pay | Admitting: Internal Medicine

## 2020-10-11 ENCOUNTER — Ambulatory Visit: Payer: PPO | Admitting: Physician Assistant

## 2020-10-18 ENCOUNTER — Encounter: Payer: Self-pay | Admitting: Physician Assistant

## 2020-10-18 ENCOUNTER — Ambulatory Visit (INDEPENDENT_AMBULATORY_CARE_PROVIDER_SITE_OTHER): Payer: PPO

## 2020-10-18 ENCOUNTER — Ambulatory Visit (INDEPENDENT_AMBULATORY_CARE_PROVIDER_SITE_OTHER): Payer: PPO | Admitting: Physician Assistant

## 2020-10-18 ENCOUNTER — Other Ambulatory Visit (HOSPITAL_COMMUNITY): Payer: Self-pay | Admitting: Internal Medicine

## 2020-10-18 DIAGNOSIS — M7061 Trochanteric bursitis, right hip: Secondary | ICD-10-CM

## 2020-10-18 DIAGNOSIS — G8929 Other chronic pain: Secondary | ICD-10-CM

## 2020-10-18 DIAGNOSIS — M25511 Pain in right shoulder: Secondary | ICD-10-CM | POA: Diagnosis not present

## 2020-10-18 MED ORDER — LIDOCAINE HCL 1 % IJ SOLN
3.0000 mL | INTRAMUSCULAR | Status: AC | PRN
  Administered 2020-10-18: 3 mL

## 2020-10-18 MED ORDER — METHYLPREDNISOLONE ACETATE 40 MG/ML IJ SUSP
40.0000 mg | INTRAMUSCULAR | Status: AC | PRN
  Administered 2020-10-18: 40 mg via INTRA_ARTICULAR

## 2020-10-18 MED FILL — SHINGRIX 50 MCG SUS: 50 | 1 days supply | Qty: 1 | Fill #0

## 2020-10-18 NOTE — Progress Notes (Signed)
Office Visit Note   Patient: Todd Mendoza           Date of Birth: [birth year]           MRN: [id number] Visit Date: 10/18/2020               Requested by: Binnie Rail, MD [place],  [place] [address] PCP: Binnie Rail, MD   Assessment & Plan: Visit Diagnoses:  1. Chronic right shoulder pain   2. Trochanteric bursitis of right hip     Plan:  Patient shown exercises for her shoulder this included Codman, pendulum, wall crawls and forward flexion exercises.  In regards to the right hip showed an IT band stretching exercises.  See him back in 2 weeks if he is continuing to have problems with either the hip or the shoulder.  Otherwise follow-up as needed.  Questions were encouraged and answered at length.  Follow-Up Instructions: Return in about 2 weeks (around 11/01/2020).   Orders:  Orders Placed This Encounter  Procedures  . Large Joint Inj: R greater trochanter  . Large Joint Inj: R subacromial bursa  . XR Shoulder Right  . XR HIP UNILAT W OR W/O PELVIS 2-3 VIEWS RIGHT   No orders of the defined types were placed in this encounter.     Procedures: Large Joint Inj: R greater trochanter on 10/18/2020 9:01 AM Indications: pain Details: 22 G 1.5 in needle, lateral approach  Arthrogram: No  Medications: 3 mL lidocaine 1 %; 40 mg methylPREDNISolone acetate 40 MG/ML Outcome: tolerated well, no immediate complications Procedure, treatment alternatives, risks and benefits explained, specific risks discussed. Consent was given by the patient. Immediately prior to procedure a time out was called to verify the correct patient, procedure, equipment, support staff and site/side marked as required. Patient was prepped and draped in the usual sterile fashion.   Large Joint Inj: R subacromial bursa on 10/18/2020 9:02 AM Indications: pain Details: 22 G 1.5 in needle, lateral approach  Arthrogram: No  Medications: 3 mL lidocaine 1 %; 40 mg methylPREDNISolone acetate  40 MG/ML Outcome: tolerated well, no immediate complications Procedure, treatment alternatives, risks and benefits explained, specific risks discussed. Consent was given by the patient. Immediately prior to procedure a time out was called to verify the correct patient, procedure, equipment, support staff and site/side marked as required. Patient was prepped and draped in the usual sterile fashion.       Clinical Data: No additional findings.   Subjective: Chief Complaint  Patient presents with  . Right Shoulder - Pain  . Right Hip - Pain    HPI Mr. Resnick is a pleasant 70 year old male comes in today for right shoulder and right hip pain for the past 6 months.  No known injury to either the hip or the shoulder.  He states that right hip pain lateral aspect of the hip no groin pain.  No radicular pain.  Does bother him if he sleeps on the right side.  He has tried Mobic but this did not help.  In regards to his right shoulder he has pain that started around the [facility] joint and radiates to the mid humerus.  Pain does awaken him.  Denies any neck pain no numbness tingling.  He also tried Mobic for this but did not help.  Nondiabetic.   Review of Systems Negative for fevers chills.    Objective: Vital Signs: There were no vitals taken for this visit.  Physical Exam  General: Well-developed well-nourished male in no acute distress.  Mood and affect appropriate. Psych: Alert and oriented x3 Ortho Exam Bilateral hips excellent range of motion without pain.  Tenderness over the right hip trochanteric region only. Bilateral shoulders: Positive impingement sign on the right negative on the left.  5 out of 5 strength with external and internal rotation against resistance.  Empty can test is negative bilaterally.  Slight tenderness over the right AC joint.  There is areas of most likely lipomas over the [facility] joint region and the lateral aspect of the right shoulder.  No abnormal warmth erythema  about the right shoulder.  Left shoulder full forward flexion without pain.  He is slightly limited forward flexion of the right shoulder but does have directed the arm. Specialty Comments:  No specialty comments available.  Imaging: XR HIP UNILAT W OR W/O PELVIS 2-3 VIEWS RIGHT  Result Date: 10/18/2020 AP pelvis and lateral view right hip: Bilateral hips well located.  Hip joints well-preserved bilaterally on the AP view and the lateral view of the right hip.  No acute fractures or bony abnormalities.  Bone density appears normal.  XR Shoulder Right  Result Date: 10/18/2020 Right shoulder 3 views: Downsloping acromion: Mild to moderate arthritic changes AC joint.  Glenohumeral joints well-maintained.  Shoulder is well located.  No acute fractures or acute findings.    PMFS History: Patient Active Problem List   Diagnosis Date Noted  . Chronic right shoulder pain 10/04/2020  . Frequent headaches 10/13/2019  . Hyperglycemia 10/11/2019  . Hepatitis C antibody test positive 06/26/2019  . DOE (dyspnea on exertion) 02/13/2019  . Overweight (BMI 25.0-29.9) 02/13/2019  . Anemia 04/30/2018  . ED (erectile dysfunction) 11/03/2016  . Lumbar radiculopathy 06/30/2016  . Family history of diabetes mellitus 03/14/2016  . Encounter for prostate cancer screening 03/14/2016  . H/O: CVA (cerebrovascular accident) 12/26/2012  . HTN (hypertension) 12/26/2012  . HLD (hyperlipidemia) 12/26/2012  . Allergic rhinitis 12/26/2012   Past Medical History:  Diagnosis Date  . Hyperlipidemia   . Hypertension   . Stroke ([place])   . Vertigo     Family History  Problem Relation Age of Onset  . Diabetes Father   . Colon cancer Neg Hx   . Colon polyps Neg Hx   . Esophageal cancer Neg Hx   . Stomach cancer Neg Hx   . Rectal cancer Neg Hx     Past Surgical History:  Procedure Laterality Date  . NO PAST SURGERIES     Social History   Occupational History  . 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 Smoking status: Former Smoker    Packs/day: 0.20    Types: Cigarettes  . Smokeless tobacco: Never Used  Vaping Use  . Vaping Use: Never used  Substance and Sexual Activity  . Alcohol use: No    Alcohol/week: 0.0 standard drinks  . Drug use: No  . Sexual activity: Yes

## 2020-11-01 ENCOUNTER — Ambulatory Visit: Payer: PPO | Admitting: Physician Assistant

## 2020-11-01 ENCOUNTER — Encounter: Payer: Self-pay | Admitting: Physician Assistant

## 2020-11-01 DIAGNOSIS — M25511 Pain in right shoulder: Secondary | ICD-10-CM

## 2020-11-01 DIAGNOSIS — G8929 Other chronic pain: Secondary | ICD-10-CM | POA: Diagnosis not present

## 2020-11-01 DIAGNOSIS — M7061 Trochanteric bursitis, right hip: Secondary | ICD-10-CM | POA: Diagnosis not present

## 2020-11-01 NOTE — Addendum Note (Signed)
Addended by: Robyne Peers on: 11/01/2020 02:10 PM   Modules accepted: Orders

## 2020-11-01 NOTE — Progress Notes (Signed)
HPI: Mr. Hopfensperger returns today follow-up of his right shoulder and right hip.  He states that the right shoulder is in fact worse than it was when we saw him 2 weeks ago.  He states the exercises made it worse and he had to stop.  He does not feel that the injection helped with his right shoulder at all.  His right lateral hip pain comes and goes.  He has not been doing his stretching but feels that the injection may have helped some.  Review of systems: Please see HPI otherwise negative.  Physical exam: General: Well-developed well-nourished male in no acute distress.  Mood and affect appropriate.  Bilateral shoulders: +5 strength with external and internal rotation against resistance.  Slight weakness on the right with empty can test.  Pendular testing positive on the right negative on the left.  Forward flexion actively he can come to approximately 120 degrees passively I can bring him to 160 degrees only.  Full range of motion actively of the left shoulder.  Right hip: Good range of motion without pain.  Nontender over the trochanteric region.  Impression: Right shoulder impingement Right trochanteric bursitis  Plan: Due to the fact the patient continues to have pain in the right shoulder despite conservative treatment and the fact that the shoulder pain is getting worse and his range of motion is diminishing I recommend MRI to rule out rotator cuff tear.  Have him back after the MRI to go over results discuss further treatment.  In regards to his right hip we will have him do IT band stretching exercises I had him demonstrate these back to me again today.

## 2020-11-08 ENCOUNTER — Other Ambulatory Visit: Payer: Self-pay

## 2020-11-08 ENCOUNTER — Encounter: Payer: Self-pay | Admitting: Nurse Practitioner

## 2020-11-08 ENCOUNTER — Other Ambulatory Visit: Payer: Self-pay | Admitting: Nurse Practitioner

## 2020-11-08 ENCOUNTER — Ambulatory Visit (INDEPENDENT_AMBULATORY_CARE_PROVIDER_SITE_OTHER): Payer: PPO | Admitting: Nurse Practitioner

## 2020-11-08 VITALS — BP 138/86 | HR 68 | Temp 97.3°F | Resp 16 | Ht 70.0 in | Wt 200.8 lb

## 2020-11-08 DIAGNOSIS — E7849 Other hyperlipidemia: Secondary | ICD-10-CM

## 2020-11-08 DIAGNOSIS — M25511 Pain in right shoulder: Secondary | ICD-10-CM

## 2020-11-08 DIAGNOSIS — R3912 Poor urinary stream: Secondary | ICD-10-CM

## 2020-11-08 DIAGNOSIS — Z131 Encounter for screening for diabetes mellitus: Secondary | ICD-10-CM | POA: Diagnosis not present

## 2020-11-08 DIAGNOSIS — I10 Essential (primary) hypertension: Secondary | ICD-10-CM | POA: Diagnosis not present

## 2020-11-08 DIAGNOSIS — Z130 Encounter for screening for diseases of the blood and blood-forming organs and certain disorders involving the immune mechanism: Secondary | ICD-10-CM | POA: Diagnosis not present

## 2020-11-08 DIAGNOSIS — N401 Enlarged prostate with lower urinary tract symptoms: Secondary | ICD-10-CM

## 2020-11-08 DIAGNOSIS — Z7689 Persons encountering health services in other specified circumstances: Secondary | ICD-10-CM

## 2020-11-08 DIAGNOSIS — Z125 Encounter for screening for malignant neoplasm of prostate: Secondary | ICD-10-CM | POA: Diagnosis not present

## 2020-11-08 DIAGNOSIS — G8929 Other chronic pain: Secondary | ICD-10-CM

## 2020-11-08 LAB — POCT CBG (FASTING - GLUCOSE)-MANUAL ENTRY: Glucose Fasting, POC: 93 mg/dL (ref 70–99)

## 2020-11-08 MED ORDER — HYDROCHLOROTHIAZIDE 25 MG PO TABS: 25.0000 mg | ORAL_TABLET | Freq: Every day | ORAL | 3 refills | Status: DC

## 2020-11-08 MED ORDER — TAMSULOSIN HCL 0.4 MG PO CAPS: 0.4000 mg | ORAL_CAPSULE | Freq: Every day | ORAL | 1 refills | Status: DC

## 2020-11-08 MED ORDER — METHYLPREDNISOLONE 4 MG PO TBPK: ORAL_TABLET | ORAL | 0 refills | Status: AC

## 2020-11-08 NOTE — Progress Notes (Signed)
New Patient Office Visit  Subjective:  Patient ID: Todd Mendoza, male    DOB: 23-Dec-1950  Age: 70 y.o. MRN: [id number]  CC:  Chief Complaint  Patient presents with  . Establish Care    HPI Todd Mendoza presents for establish care. He  has a past medical history of Hyperlipidemia, Hypertension, Stroke ([place]), and Vertigo.   He is in today to establish care.  He has been followed by primary care and admits that he is doing well for the most part.  He does have some concerns about his ongoing right shoulder pain.  He is currently being followed by orthopedist and has an upcoming MRI.  He admits that he has had a cortisone injection however it was not very helpful.  He was also started on gabapentin however upon research saw that it was used for seizure disorder and discontinued the use.  He has a history of hypertension and is currently on Norvasc 5 mg along with hydrochlorothiazide 25 mg.  He admits that he is not taking the hydrochlorothiazide regularly.  He denies headache, dizziness, visual changes, shortness of breath, dyspnea on exertion, chest pain, nausea, vomiting or any swelling in his legs feet or ankles. Past Medical History:  Diagnosis Date  . Hyperlipidemia   . Hypertension   . Stroke ([place])   . Vertigo     Past Surgical History:  Procedure Laterality Date  . NO PAST SURGERIES      Family History  Problem Relation Age of Onset  . Diabetes Father   . Colon cancer Neg Hx   . Colon polyps Neg Hx   . Esophageal cancer Neg Hx   . Stomach cancer Neg Hx   . Rectal cancer Neg Hx     Social History   Socioeconomic History  . Marital status: Married    Spouse name: Not on file  . Number of children: Not on file  . Years of education: Not on file  . Highest education level: Not on file  Occupational History  . 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 Smoking status: Former Smoker    Packs/day: 0.20    Types: Cigarettes  . Smokeless tobacco: Never Used   Vaping Use  . Vaping Use: Never used  Substance and Sexual Activity  . Alcohol use: No    Alcohol/week: 0.0 standard drinks  . Drug use: No  . Sexual activity: Yes  Other Topics Concern  . Not on file  Social History Narrative   Married   Psychologist, counselling daily   Social Determinants of Health   Financial Resource Strain: Low Risk   . Difficulty of Paying Living Expenses: Not hard at all  Food Insecurity: No Food Insecurity  . Worried About Charity fundraiser in the Last Year: Never true  . Ran Out of Food in the Last Year: Never true  Transportation Needs: No Transportation Needs  . Lack of Transportation (Medical): No  . Lack of Transportation (Non-Medical): No  Physical Activity: Sufficiently Active  . Days of Exercise per Week: 5 days  . Minutes of Exercise per Session: 30 min  Stress: No Stress Concern Present  . Feeling of Stress : Not at all  Social Connections: Socially Integrated  . Frequency of Communication with Friends and Family: More than three times a week  . Frequency of Social Gatherings with Friends and Family: More than three times a week  . Attends Religious Services: More than 4 times per year  .  Active Member of Clubs or Organizations: Yes  . Attends Archivist Meetings: More than 4 times per year  . Marital Status: Married  Human resources officer Violence: Not At Risk  . Fear of Current or Ex-Partner: No  . Emotionally Abused: No  . Physically Abused: No  . Sexually Abused: No    ROS Review of Systems  Constitutional: Negative.   HENT: Negative.   Eyes: Negative.   Genitourinary: Positive for frequency.       Decreased stream hesitancy   Musculoskeletal: Positive for arthralgias, back pain and joint swelling (right shoulder).  Neurological: Negative for weakness and numbness.       Denies tingling    Objective:   Today's Vitals: BP 138/86   Pulse 68   Temp (!) 97.3 F (36.3 C)   Resp 16   Ht 5\' 10"  (1.778 m)   Wt 200 lb 12.8 oz (91.1  kg)   SpO2 97%   BMI 28.81 kg/m   Physical Exam Constitutional:      General: He is not in acute distress. HENT:     Head: Normocephalic and atraumatic.     Right Ear: Tympanic membrane normal.     Left Ear: Tympanic membrane normal.     Nose: Nose normal.     Mouth/Throat:     Mouth: Mucous membranes are moist.  Cardiovascular:     Rate and Rhythm: Normal rate and regular rhythm.     Pulses: Normal pulses.     Heart sounds: Normal heart sounds.  Pulmonary:     Effort: Pulmonary effort is normal.     Breath sounds: Normal breath sounds.  Abdominal:     General: Abdomen is flat.     Palpations: Abdomen is soft.     Comments: hypoacitve  Musculoskeletal:        General: Tenderness present.     Cervical back: Normal range of motion.     Comments: Decreased ROM in right shoulder   Skin:    General: Skin is warm and dry.     Capillary Refill: Capillary refill takes less than 2 seconds.  Neurological:     General: No focal deficit present.     Mental Status: He is alert and oriented to person, place, and time.  Psychiatric:        Mood and Affect: Mood normal.        Behavior: Behavior normal.        Thought Content: Thought content normal.        Judgment: Judgment normal.     Assessment & Plan:   Problem List Items Addressed This Visit      Other   Chronic right shoulder pain Persistent MRI pending Trial Medrol Dosepak due to previous therapy not been effective, to see if this will provide more relief until the MRI is completed   HLD (hyperlipidemia) Evaluation last lipid panel in 09/2019   Relevant Orders   Lipid panel    Other Visit Diagnoses    Encounter to establish care    -  Primary Discussed male health maintenance; self exams of breast and testicles  and annual exams. Discussed prostate age related concerns Discussed general safety in vehicle and COVID Discussed regular hydration with water Discussed healthy diet and exercise and weight  management Discussed sexual health  Discussed mental health Encouraged to call our office for an appointment with in ongoing concerns for questions.      Essential hypertension  Relevant Orders   Comp. Metabolic Panel (12)   Screening for diabetes mellitus       Relevant Orders   POCT CBG (Fasting - Glucose)   Screening for prostate cancer       Relevant Orders   POCT urinalysis dipstick   PSA   Screening for iron deficiency anemia       Relevant Orders   CBC with Differential/Platelet   Benign prostatic hyperplasia with weak urinary stream   Trial Flomax due to symptoms of hesitation and decreased urinary stream 6 week follow up     Relevant Medications   tamsulosin (FLOMAX) 0.4 MG CAPS capsule      Outpatient Encounter Medications as of 11/08/2020  Medication Sig  . aspirin EC 81 MG tablet Take 1 tablet (81 mg total) by mouth daily.  Marland Kitchen atorvastatin (LIPITOR) 20 MG tablet TAKE 1 TABLET BY MOUTH EVERY DAY  . hydrochlorothiazide (HYDRODIURIL) 25 MG tablet TAKE 1 TABLET BY MOUTH EVERY DAY  . tamsulosin (FLOMAX) 0.4 MG CAPS capsule Take 1 capsule (0.4 mg total) by mouth daily.  Marland Kitchen amLODipine (NORVASC) 5 MG tablet Take 1 tablet (5 mg total) by mouth daily. (Patient not taking: Reported on 11/08/2020)  . meloxicam (MOBIC) 15 MG tablet TAKE 1 TABLET BY MOUTH EVERY DAY WITH FOOD (Patient not taking: Reported on 11/08/2020)   No facility-administered encounter medications on file as of 11/08/2020.    Follow-up: Return in about 6 weeks (around 12/20/2020) for follow up medication management [id number].   Vevelyn Francois, NP

## 2020-11-08 NOTE — Patient Instructions (Signed)

## 2020-11-08 NOTE — Progress Notes (Signed)
C/o back pain Would like Rx for shoulder, back and left leg Has MRI scheduled.

## 2020-11-08 NOTE — Progress Notes (Signed)
   [facility] [address] [place], [place]  [address] Phone:  [phone]   Fax:  [phone] 

## 2020-11-09 LAB — CBC WITH DIFFERENTIAL/PLATELET
Basophils Absolute: 0 10*3/uL (ref 0.0–0.2)
Basos: 1 %
EOS (ABSOLUTE): 0.2 10*3/uL (ref 0.0–0.4)
Eos: 4 %
Hematocrit: 41 % (ref 37.5–51.0)
Hemoglobin: 13.3 g/dL (ref 13.0–17.7)
Immature Grans (Abs): 0 10*3/uL (ref 0.0–0.1)
Immature Granulocytes: 0 %
Lymphocytes Absolute: 2.8 10*3/uL (ref 0.7–3.1)
Lymphs: 53 %
MCH: 28.4 pg (ref 26.6–33.0)
MCHC: 32.4 g/dL (ref 31.5–35.7)
MCV: 87 fL (ref 79–97)
Monocytes Absolute: 0.5 10*3/uL (ref 0.1–0.9)
Monocytes: 10 %
Neutrophils Absolute: 1.7 10*3/uL (ref 1.4–7.0)
Neutrophils: 32 %
Platelets: 249 10*3/uL (ref 150–450)
RBC: 4.69 x10E6/uL (ref 4.14–5.80)
RDW: 12 % (ref 11.6–15.4)
WBC: 5.2 10*3/uL (ref 3.4–10.8)

## 2020-11-09 LAB — COMP. METABOLIC PANEL (12)
AST: 32 IU/L (ref 0–40)
Albumin/Globulin Ratio: 1.6 (ref 1.2–2.2)
Albumin: 4.5 g/dL (ref 3.8–4.8)
Alkaline Phosphatase: 87 IU/L (ref 44–121)
BUN/Creatinine Ratio: 10 (ref 10–24)
BUN: 10 mg/dL (ref 8–27)
Bilirubin Total: 0.5 mg/dL (ref 0.0–1.2)
Calcium: 9.6 mg/dL (ref 8.6–10.2)
Chloride: 98 mmol/L (ref 96–106)
Creatinine, Ser: 1 mg/dL (ref 0.76–1.27)
GFR calc Af Amer: 88 mL/min/{1.73_m2} (ref >59)
GFR calc non Af Amer: 76 mL/min/{1.73_m2} (ref >59)
Globulin, Total: 2.9 g/dL (ref 1.5–4.5)
Glucose: 85 mg/dL (ref 65–99)
Potassium: 4.5 mmol/L (ref 3.5–5.2)
Sodium: 139 mmol/L (ref 134–144)
Total Protein: 7.4 g/dL (ref 6.0–8.5)

## 2020-11-09 LAB — LIPID PANEL
Chol/HDL Ratio: 4.4 ratio (ref 0.0–5.0)
Cholesterol, Total: 162 mg/dL (ref 100–199)
HDL: 37 mg/dL — ABNORMAL LOW (ref >39)
LDL Chol Calc (NIH): 99 mg/dL (ref 0–99)
Triglycerides: 147 mg/dL (ref 0–149)
VLDL Cholesterol Cal: 26 mg/dL (ref 5–40)

## 2020-11-09 LAB — PSA: Prostate Specific Ag, Serum: 0.7 ng/mL (ref 0.0–4.0)

## 2020-11-11 ENCOUNTER — Telehealth: Payer: Self-pay

## 2020-11-11 NOTE — Telephone Encounter (Signed)
Patient aware of results and recommendations. °

## 2020-11-11 NOTE — Telephone Encounter (Signed)
-----   Message from Vevelyn Francois, NP sent at 11/10/2020  9:09 AM EST ----- MyChart message sent to the patient. However additional follow up maybe needed. Thanks

## 2020-11-22 ENCOUNTER — Ambulatory Visit
Admission: RE | Admit: 2020-11-22 | Discharge: 2020-11-22 | Disposition: A | Discharge status: Home / Self Care | Payer: PPO | Source: Ambulatory Visit | Attending: Physician Assistant | Admitting: Physician Assistant

## 2020-11-22 ENCOUNTER — Other Ambulatory Visit: Payer: Self-pay

## 2020-11-22 DIAGNOSIS — G8929 Other chronic pain: Secondary | ICD-10-CM

## 2020-11-22 DIAGNOSIS — M25511 Pain in right shoulder: Secondary | ICD-10-CM | POA: Diagnosis not present

## 2020-11-30 ENCOUNTER — Other Ambulatory Visit: Payer: Self-pay | Admitting: Nurse Practitioner

## 2020-11-30 DIAGNOSIS — N401 Enlarged prostate with lower urinary tract symptoms: Secondary | ICD-10-CM

## 2020-11-30 DIAGNOSIS — R3912 Poor urinary stream: Secondary | ICD-10-CM

## 2020-12-06 ENCOUNTER — Ambulatory Visit: Payer: PPO | Admitting: Physician Assistant

## 2020-12-06 ENCOUNTER — Ambulatory Visit: Payer: Self-pay

## 2020-12-06 ENCOUNTER — Encounter: Payer: Self-pay | Admitting: Physician Assistant

## 2020-12-06 ENCOUNTER — Other Ambulatory Visit: Payer: Self-pay

## 2020-12-06 DIAGNOSIS — G8929 Other chronic pain: Secondary | ICD-10-CM

## 2020-12-06 DIAGNOSIS — M25511 Pain in right shoulder: Secondary | ICD-10-CM | POA: Diagnosis not present

## 2020-12-06 NOTE — Progress Notes (Signed)
Subjective: Patient is here for ultrasound-guided intra-articular left glenohumeral injection.    Objective:  Pain with overhead reach.  Procedure: Ultrasound guided injection is preferred based studies that show increased duration, increased effect, greater accuracy, decreased procedural pain, increased response rate, and decreased cost with ultrasound guided versus blind injection.   Verbal informed consent obtained.  Time-out conducted.  Noted no overlying erythema, induration, or other signs of local infection. Ultrasound-guided left glenohumeral injection: After sterile prep with Betadine, injected 4 cc 0.25% bupivocaine without epinephrine and 6 mg betamethasone using a 22-gauge spinal needle, passing the needle from posterior approach into the glenohumeral joint.  Injectate seen filling joint capsule.  Good immediate relief.

## 2020-12-06 NOTE — Progress Notes (Signed)
HPI: Todd Mendoza returns today to go over the MRI of his right shoulder.  He continues to have pain in the shoulder.  He has failed conservative treatment which is consisted of physical therapy and a subacromial injection. MRI images of the right shoulder dated 11/22/2020 shows no evidence of rotator cuff tear.  Mild rotator cuff tendinopathy.  Moderate to moderately severe AC joint arthritic changes.  And moderate glenohumeral arthritis.  2 cystic lesions are seen in the subcutaneous fat felt to be consistent with sebaceous cyst.  Review of systems: See HPI otherwise negative or noncontributory.  Right shoulder he is 5 and 5 strength with external and internal rotation against resistance.  Pain with abduction of the right shoulder across the chest and limited forward flexion coming to proximately 130 degrees actively. Impingement testing positive on the right.   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 Junius Roads.  Having follow-up in 2 weeks to see what type of response he had.  Questions were encouraged and answered at length today.

## 2020-12-20 ENCOUNTER — Encounter: Payer: Self-pay | Admitting: Nurse Practitioner

## 2020-12-20 ENCOUNTER — Ambulatory Visit (INDEPENDENT_AMBULATORY_CARE_PROVIDER_SITE_OTHER): Payer: PPO | Admitting: Nurse Practitioner

## 2020-12-20 ENCOUNTER — Other Ambulatory Visit: Payer: Self-pay

## 2020-12-20 VITALS — BP 112/74 | HR 70 | Temp 98.2°F | Ht 70.0 in | Wt 202.0 lb

## 2020-12-20 DIAGNOSIS — M5441 Lumbago with sciatica, right side: Secondary | ICD-10-CM

## 2020-12-20 DIAGNOSIS — N400 Enlarged prostate without lower urinary tract symptoms: Secondary | ICD-10-CM | POA: Diagnosis not present

## 2020-12-20 DIAGNOSIS — I10 Essential (primary) hypertension: Secondary | ICD-10-CM

## 2020-12-20 DIAGNOSIS — G8929 Other chronic pain: Secondary | ICD-10-CM

## 2020-12-20 MED ORDER — CYCLOBENZAPRINE HCL 5 MG PO TABS: 5.0000 mg | ORAL_TABLET | Freq: Every day | ORAL | 1 refills | Status: AC

## 2020-12-20 MED ORDER — AMLODIPINE BESYLATE 5 MG PO TABS: 5.0000 mg | ORAL_TABLET | Freq: Every day | ORAL | 3 refills | Status: DC

## 2020-12-20 MED ORDER — FINASTERIDE 5 MG PO TABS: 5.0000 mg | ORAL_TABLET | Freq: Every day | ORAL | 3 refills | Status: DC

## 2020-12-20 MED ORDER — ATORVASTATIN CALCIUM 20 MG PO TABS: 20.0000 mg | ORAL_TABLET | Freq: Every day | ORAL | 3 refills | Status: DC

## 2020-12-20 MED FILL — SHINGRIX 50 MCG SUS: 50 | 1 days supply | Qty: 1 | Fill #1

## 2020-12-20 NOTE — Patient Instructions (Addendum)
Imaging Scheduling  [phone]   Low Back Sprain or Strain Rehab Ask your health care provider which exercises are safe for you. Do exercises exactly as told by your health care provider and adjust them as directed. It is normal to feel mild stretching, pulling, tightness, or discomfort as you do these exercises. Stop right away if you feel sudden pain or your pain gets worse. Do not begin these exercises until told by your health care provider. Stretching and range-of-motion exercises These exercises warm up your muscles and joints and improve the movement and flexibility of your back. These exercises also help to relieve pain, numbness, and tingling. Lumbar rotation 1. Lie on your back on a firm surface and bend your knees. 2. Straighten your arms out to your sides so each arm forms a 90-degree angle (right angle) with a side of your body. 3. Slowly move (rotate) both of your knees to one side of your body until you feel a stretch in your lower back (lumbar). Try not to let your shoulders lift off the floor. 4. Hold this position for __________ seconds. 5. Tense your abdominal muscles and slowly move your knees back to the starting position. 6. Repeat this exercise on the other side of your body. Repeat __________ times. Complete this exercise __________ times a day.   Single knee to chest 1. Lie on your back on a firm surface with both legs straight. 2. Bend one of your knees. Use your hands to move your knee up toward your chest until you feel a gentle stretch in your lower back and buttock. ? Hold your leg in this position by holding on to the front of your knee. ? Keep your other leg as straight as possible. 3. Hold this position for __________ seconds. 4. Slowly return to the starting position. 5. Repeat with your other leg. Repeat __________ times. Complete this exercise __________ times a day.   Prone extension on elbows 1. Lie on your abdomen on a firm surface (prone  position). 2. Prop yourself up on your elbows. 3. Use your arms to help lift your chest up until you feel a gentle stretch in your abdomen and your lower back. ? This will place some of your body weight on your elbows. If this is uncomfortable, try stacking pillows under your chest. ? Your hips should stay down, against the surface that you are lying on. Keep your hip and back muscles relaxed. 4. Hold this position for __________ seconds. 5. Slowly relax your upper body and return to the starting position. Repeat __________ times. Complete this exercise __________ times a day.   Strengthening exercises These exercises build strength and endurance in your back. Endurance is the ability to use your muscles for a long time, even after they get tired. Pelvic tilt This exercise strengthens the muscles that lie deep in the abdomen. 1. Lie on your back on a firm surface. Bend your knees and keep your feet flat on the floor. 2. Tense your abdominal muscles. Tip your pelvis up toward the ceiling and flatten your lower back into the floor. ? To help with this exercise, you may place a small towel under your lower back and try to push your back into the towel. 3. Hold this position for __________ seconds. 4. Let your muscles relax completely before you repeat this exercise. Repeat __________ times. Complete this exercise __________ times a day. Alternating arm and leg raises 1. Get on your hands and knees on a firm surface.  If you are on a hard floor, you may want to use padding, such as an exercise mat, to cushion your knees. 2. Line up your arms and legs. Your hands should be directly below your shoulders, and your knees should be directly below your hips. 3. Lift your left leg behind you. At the same time, raise your right arm and straighten it in front of you. ? Do not lift your leg higher than your hip. ? Do not lift your arm higher than your shoulder. ? Keep your abdominal and back muscles  tight. ? Keep your hips facing the ground. ? Do not arch your back. ? Keep your balance carefully, and do not hold your breath. 4. Hold this position for __________ seconds. 5. Slowly return to the starting position. 6. Repeat with your right leg and your left arm. Repeat __________ times. Complete this exercise __________ times a day.   Abdominal set with straight leg raise 1. Lie on your back on a firm surface. 2. Bend one of your knees and keep your other leg straight. 3. Tense your abdominal muscles and lift your straight leg up, 4-6 inches (10-15 cm) off the ground. 4. Keep your abdominal muscles tight and hold this position for __________ seconds. ? Do not hold your breath. ? Do not arch your back. Keep it flat against the ground. 5. Keep your abdominal muscles tense as you slowly lower your leg back to the starting position. 6. Repeat with your other leg. Repeat __________ times. Complete this exercise __________ times a day.   Single leg lower with bent knees 1. Lie on your back on a firm surface. 2. Tense your abdominal muscles and lift your feet off the floor, one foot at a time, so your knees and hips are bent in 90-degree angles (right angles). ? Your knees should be over your hips and your lower legs should be parallel to the floor. 3. Keeping your abdominal muscles tense and your knee bent, slowly lower one of your legs so your toe touches the ground. 4. Lift your leg back up to return to the starting position. ? Do not hold your breath. ? Do not let your back arch. Keep your back flat against the ground. 5. Repeat with your other leg. Repeat __________ times. Complete this exercise __________ times a day. Posture and body mechanics Good posture and healthy body mechanics can help to relieve stress in your body's tissues and joints. Body mechanics refers to the movements and positions of your body while you do your daily activities. Posture is part of body mechanics. Good  posture means:  Your spine is in its natural S-curve position (neutral).  Your shoulders are pulled back slightly.  Your head is not tipped forward. Follow these guidelines to improve your posture and body mechanics in your everyday activities. Standing  When standing, keep your spine neutral and your feet about hip width apart. Keep a slight bend in your knees. Your ears, shoulders, and hips should line up.  When you do a task in which you stand in one place for a long time, place one foot up on a stable object that is 2-4 inches (5-10 cm) high, such as a footstool. This helps keep your spine neutral.   Sitting  When sitting, keep your spine neutral and keep your feet flat on the floor. Use a footrest, if necessary, and keep your thighs parallel to the floor. Avoid rounding your shoulders, and avoid tilting your head forward.  When working at a desk or a computer, keep your desk at a height where your hands are slightly lower than your elbows. Slide your chair under your desk so you are close enough to maintain good posture.  When working at a computer, place your monitor at a height where you are looking straight ahead and you do not have to tilt your head forward or downward to look at the screen.   Resting  When lying down and resting, avoid positions that are most painful for you.  If you have pain with activities such as sitting, bending, stooping, or squatting, lie in a position in which your body does not bend very much. For example, avoid curling up on your side with your arms and knees near your chest (fetal position).  If you have pain with activities such as standing for a long time or reaching with your arms, lie with your spine in a neutral position and bend your knees slightly. Try the following positions: ? Lying on your side with a pillow between your knees. ? Lying on your back with a pillow under your knees. Lifting  When lifting objects, keep your feet at least  shoulder width apart and tighten your abdominal muscles.  Bend your knees and hips and keep your spine neutral. It is important to lift using the strength of your legs, not your back. Do not lock your knees straight out.  Always ask for help to lift heavy or awkward objects.   This information is not intended to replace advice given to you by your health care provider. Make sure you discuss any questions you have with your health care provider. Document Revised: 01/03/2019 Document Reviewed: 10/03/2018 Elsevier Patient Education  [employer].

## 2020-12-20 NOTE — Progress Notes (Signed)
[address], [place]  [address] Phone:  [phone]   Fax:  [phone]   Established Patient Office Visit  Subjective:  Patient ID: Todd Mendoza, male    DOB: November 29, 1950  Age: 70 y.o. MRN: [id number]   CC:  Chief Complaint  Patient presents with  . Follow-up    Discuss medication , need a refill on b/p medication      HPI Todd Mendoza presents for follow-up. He  has a past medical history of Hyperlipidemia, Hypertension, Stroke ([place]), and Vertigo.   Back Pain Patient presents for evaluation of low back problems. Symptoms have been present for several Years and include pain in Lower back (aching in character; Varies/10 in severity).  He admits that he does have pain that goes down the right side of his leg into the thigh.  Initial inciting event: none. Symptoms are worse in the morning. Alleviating factors identifiable by the patient are none. Aggravating factors identifiable by the patient are bending backwards. Treatments initiated by the patient: Over-the-counter NSAIDs. Previous lower back problems: Yes. Previous work up: none. Previous treatments: none.  He has been seen by orthopedics for his shoulder however his back was not addressed.  He was started on Flomax for decreased urinary flow.  He admits that the first dose was very effective however the symptoms have returned.  He is concerned that it may not be strong enough..  He has a history of hypertension and is currently on Norvasc 5 mg along with hydrochlorothiazide 25 mg daily.  He is requesting refill.  He has been out of the Norvasc for a few days.  He denies headache, dizziness, visual changes, shortness of breath, dyspnea on exertion, chest pain, nausea, vomiting or any edema.    Past Medical History:  Diagnosis Date  . Hyperlipidemia   . Hypertension   . Stroke ([place])   . Vertigo     Past Surgical History:  Procedure Laterality Date  . NO PAST SURGERIES       Family History  Problem Relation Age of Onset  . Diabetes Father   . Colon cancer Neg Hx   . Colon polyps Neg Hx   . Esophageal cancer Neg Hx   . Stomach cancer Neg Hx   . Rectal cancer Neg Hx     Social History   Socioeconomic History  . Marital status: Married    Spouse name: Not on file  . Number of children: Not on file  . Years of education: Not on file  . Highest education level: Not on file  Occupational History  . 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 Smoking status: Former Smoker    Packs/day: 0.20    Types: Cigarettes  . Smokeless tobacco: Never Used  Vaping Use  . Vaping Use: Never used  Substance and Sexual Activity  . Alcohol use: No    Alcohol/week: 0.0 standard drinks  . Drug use: No  . Sexual activity: Yes  Other Topics Concern  . Not on file  Social History Narrative   Married   Psychologist, counselling daily   Social Determinants of Health   Financial Resource Strain: Low Risk   . Difficulty of Paying Living Expenses: Not hard at all  Food Insecurity: No Food Insecurity  . Worried About Charity fundraiser in the Last Year: Never true  . Ran Out of Food in the Last Year: Never true  Transportation Needs: No Transportation Needs  .  Lack of Transportation (Medical): No  . Lack of Transportation (Non-Medical): No  Physical Activity: Sufficiently Active  . Days of Exercise per Week: 5 days  . Minutes of Exercise per Session: 30 min  Stress: No Stress Concern Present  . Feeling of Stress : Not at all  Social Connections: Socially Integrated  . Frequency of Communication with Friends and Family: More than three times a week  . Frequency of Social Gatherings with Friends and Family: More than three times a week  . Attends Religious Services: More than 4 times per year  . Active Member of Clubs or Organizations: Yes  . Attends Archivist Meetings: More than 4 times per year  . Marital Status: Married  Human resources officer Violence: Not At Risk   . Fear of Current or Ex-Partner: No  . Emotionally Abused: No  . Physically Abused: No  . Sexually Abused: No    Outpatient Medications Prior to Visit  Medication Sig Dispense Refill  . aspirin EC 81 MG tablet Take 1 tablet (81 mg total) by mouth daily. 30 tablet 0  . hydrochlorothiazide (HYDRODIURIL) 25 MG tablet Take 1 tablet (25 mg total) by mouth daily. 90 tablet 3  . tamsulosin (FLOMAX) 0.4 MG CAPS capsule TAKE 1 CAPSULE BY MOUTH EVERY DAY 30 capsule 0  . amLODipine (NORVASC) 5 MG tablet Take 1 tablet (5 mg total) by mouth daily. 90 tablet 3  . atorvastatin (LIPITOR) 20 MG tablet TAKE 1 TABLET BY MOUTH EVERY DAY 90 tablet 3  . meloxicam (MOBIC) 15 MG tablet TAKE 1 TABLET BY MOUTH EVERY DAY WITH FOOD (Patient not taking: Reported on 11/08/2020) 30 tablet 0   No facility-administered medications prior to visit.    No Known Allergies  ROS Review of Systems    Objective:    Physical Exam Constitutional:      Appearance: He is normal weight.  HENT:     Head: Normocephalic and atraumatic.  Cardiovascular:     Rate and Rhythm: Normal rate and regular rhythm.     Pulses: Normal pulses.     Heart sounds: Normal heart sounds.  Pulmonary:     Effort: Pulmonary effort is normal.     Breath sounds: Normal breath sounds.  Musculoskeletal:     Cervical back: Normal range of motion. No rigidity.     Thoracic back: Normal.     Lumbar back: No swelling, edema, deformity or signs of trauma. Negative right straight leg raise test and negative left straight leg raise test.     Comments: Figure-of-four negative x2 Pain to bilateral lumbar facets  Skin:    General: Skin is warm and dry.     Capillary Refill: Capillary refill takes less than 2 seconds.  Neurological:     General: No focal deficit present.     Mental Status: He is alert and oriented to person, place, and time.  Psychiatric:        Mood and Affect: Mood normal.        Behavior: Behavior normal.        Thought  Content: Thought content normal.     BP 112/74 (BP Location: Left Arm, Patient Position: Sitting, Cuff Size: Large)   Pulse 70   Temp 98.2 F (36.8 C) (Temporal)   Ht 5\' 10"  (1.778 m)   Wt 202 lb (91.6 kg)   SpO2 96%   BMI 28.98 kg/m  Wt Readings from Last 3 Encounters:  12/20/20 202 lb (91.6 kg)  11/08/20 200  lb 12.8 oz (91.1 kg)  10/04/20 202 lb (91.6 kg)     Health Maintenance Due  Topic Date Due  . COVID-19 Vaccine (3 - Booster for Pfizer series) 06/24/2020    There are no preventive care reminders to display for this patient.  Lab Results  Component Value Date   TSH 1.39 02/13/2019   Lab Results  Component Value Date   WBC 5.2 11/08/2020   HGB 13.3 11/08/2020   HCT 41.0 11/08/2020   MCV 87 11/08/2020   PLT 249 11/08/2020   Lab Results  Component Value Date   NA 139 11/08/2020   K 4.5 11/08/2020   CO2 28 10/13/2019   GLUCOSE 85 11/08/2020   BUN 10 11/08/2020   CREATININE 1.00 11/08/2020   BILITOT 0.5 11/08/2020   ALKPHOS 87 11/08/2020   AST 32 11/08/2020   ALT 20 10/13/2019   PROT 7.4 11/08/2020   ALBUMIN 4.5 11/08/2020   CALCIUM 9.6 11/08/2020   GFR 92.71 10/13/2019   Lab Results  Component Value Date   CHOL 162 11/08/2020   Lab Results  Component Value Date   HDL 37 (L) 11/08/2020   Lab Results  Component Value Date   LDLCALC 99 11/08/2020   Lab Results  Component Value Date   TRIG 147 11/08/2020   Lab Results  Component Value Date   CHOLHDL 4.4 11/08/2020   Lab Results  Component Value Date   HGBA1C 5.4 10/13/2019      Assessment & Plan:   Problem List Items Addressed This Visit   None   Visit Diagnoses    Chronic right-sided low back pain with right-sided sciatica    -  Primary Persistent x-rays pending we will trial cyclobenzaprine 5 mg nightly due to ongoing treatment with NSAIDs been ineffective   Relevant Medications   cyclobenzaprine (FLEXERIL) 5 MG tablet   Other Relevant Orders   DG Lumbar Spine Complete    BPH without urinary obstruction     Persistent with normal PSA will hold tamsulosin and trial finasteride 5 mg daily follow-up in 6 weeks   Relevant Medications   finasteride (PROSCAR) 5 MG tablet   Essential hypertension     Controlled we will continue with current regimen amlodipine and hydrochlorothiazide refills completed   Relevant Medications   amLODipine (NORVASC) 5 MG tablet   atorvastatin (LIPITOR) 20 MG tablet      Meds ordered this encounter  Medications  . amLODipine (NORVASC) 5 MG tablet    Sig: Take 1 tablet (5 mg total) by mouth daily.    Dispense:  90 tablet    Refill:  3    Order Specific Question:   Supervising Provider    Answer:   Tresa Garter [id number]  . atorvastatin (LIPITOR) 20 MG tablet    Sig: Take 1 tablet (20 mg total) by mouth daily.    Dispense:  90 tablet    Refill:  3    Order Specific Question:   Supervising Provider    Answer:   Tresa Garter [id number]  . finasteride (PROSCAR) 5 MG tablet    Sig: Take 1 tablet (5 mg total) by mouth daily.    Dispense:  90 tablet    Refill:  3    Order Specific Question:   Supervising Provider    Answer:   Tresa Garter [id number]  . cyclobenzaprine (FLEXERIL) 5 MG tablet    Sig: Take 1 tablet (5 mg total) by mouth at bedtime.  Dispense:  30 tablet    Refill:  1    Order Specific Question:   Supervising Provider    Answer:   Tresa Garter [id number]    Follow-up: Return in about 6 weeks (around 01/31/2021) for follow up [id number] BPH .    Vevelyn Francois, NP

## 2020-12-27 ENCOUNTER — Other Ambulatory Visit: Payer: Self-pay | Admitting: Nurse Practitioner

## 2020-12-27 DIAGNOSIS — N401 Enlarged prostate with lower urinary tract symptoms: Secondary | ICD-10-CM

## 2021-01-23 ENCOUNTER — Other Ambulatory Visit: Payer: Self-pay | Admitting: Nurse Practitioner

## 2021-01-23 DIAGNOSIS — N401 Enlarged prostate with lower urinary tract symptoms: Secondary | ICD-10-CM

## 2021-02-14 ENCOUNTER — Ambulatory Visit (INDEPENDENT_AMBULATORY_CARE_PROVIDER_SITE_OTHER): Payer: PPO | Admitting: Nurse Practitioner

## 2021-02-14 ENCOUNTER — Encounter: Payer: Self-pay | Admitting: Nurse Practitioner

## 2021-02-14 ENCOUNTER — Other Ambulatory Visit: Payer: Self-pay

## 2021-02-14 VITALS — BP 128/60 | HR 62 | Temp 97.2°F | Ht 70.0 in | Wt 203.0 lb

## 2021-02-14 DIAGNOSIS — N400 Enlarged prostate without lower urinary tract symptoms: Secondary | ICD-10-CM

## 2021-02-14 DIAGNOSIS — I10 Essential (primary) hypertension: Secondary | ICD-10-CM | POA: Diagnosis not present

## 2021-02-14 DIAGNOSIS — M5416 Radiculopathy, lumbar region: Secondary | ICD-10-CM | POA: Diagnosis not present

## 2021-02-14 NOTE — Patient Instructions (Signed)
Lumbosacral Radiculopathy Lumbosacral radiculopathy is a condition that involves the spinal nerves and nerve roots in the low back and bottom of the spine. The condition develops when these nerves and nerve roots move out of place or become inflamed and cause symptoms. What are the causes? This condition may be caused by:  Pressure from a disk that bulges out of place (herniated disk). A disk is a plate of soft cartilage that separates bones in the spine.  Disk changes that occur with age (disk degeneration).  A narrowing of the bones of the lower back (spinal stenosis).  A tumor.  An infection.  An injury that places sudden pressure on the disks that cushion the bones of your lower spine. What increases the risk? You are more likely to develop this condition if:  You are a male who is 59-7 years old.  You are a male who is 47-38 years old.  You use improper technique when lifting things.  You are overweight or live a sedentary lifestyle.  You smoke.  Your work requires frequent lifting.  You do repetitive activities that strain the spine. What are the signs or symptoms? Symptoms of this condition include:  Pain that goes down from your back into your legs (sciatica), usually on one side of the body. This is the most common symptom. The pain may be worse with sitting, coughing, or sneezing.  Pain and numbness in your legs.  Muscle weakness.  Tingling.  Loss of bladder control or bowel control.   How is this diagnosed? This condition may be diagnosed based on:  Your symptoms and medical history.  A physical exam. If the pain is lasting, you may have tests, such as:  MRI scan.  X-ray.  CT scan.  A type of X-ray used to examine the spinal canal after injecting a dye into your spine (myelogram).  A test to measure how electrical impulses move through a nerve (nerve conduction study). How is this treated? Treatment may depend on the cause of the condition  and may include:  Working with a physical therapist.  Taking pain medicine.  Applying heat and ice to affected areas.  Doing stretches to improve flexibility.  Doing exercises to strengthen back muscles.  Having chiropractic spinal manipulation.  Using transcutaneous electrical nerve stimulation (TENS) therapy.  Getting a steroid injection in the spine. In some cases, no treatment is needed. If the condition is long-lasting (chronic), or if symptoms are severe, treatment may involve surgery or lifestyle changes, such as following a weight-loss plan. Follow these instructions at home: Activity  Avoid bending and other activities that make the problem worse.  Maintain a proper position when standing or sitting: ? When standing, keep your upper back and neck straight, with your shoulders pulled back. Avoid slouching. ? When sitting, keep your back straight and relax your shoulders. Do not round your shoulders or pull them backward.  Do not sit or stand in one place for long periods of time.  Take brief periods of rest throughout the day. This will reduce your pain. It is usually better to rest by lying down or standing, not sitting.  When you are resting for longer periods, mix in some mild activity or stretching between periods of rest. This will help to prevent stiffness and pain.  Get regular exercise. Ask your health care provider what activities are safe for you. If you were shown how to do any exercises or stretches, do them as directed by your health care provider.  Do not lift anything that is heavier than 10 lb (4.5 kg) or the limit that you are told by your health care provider. Always use proper lifting technique, which includes: ? Bending your knees. ? Keeping the load close to your body. ? Avoiding twisting. Managing pain  If directed, put ice on the affected area: ? Put ice in a plastic bag. ? Place a towel between your skin and the bag. ? Leave the ice on for  20 minutes, 2-3 times a day.  If directed, apply heat to the affected area as often as told by your health care provider. Use the heat source that your health care provider recommends, such as a moist heat pack or a heating pad. ? Place a towel between your skin and the heat source. ? Leave the heat on for 20-30 minutes. ? Remove the heat if your skin turns bright red. This is especially important if you are unable to feel pain, heat, or cold. You may have a greater risk of getting burned.  Take over-the-counter and prescription medicines only as told by your health care provider. General instructions  Sleep on a firm mattress in a comfortable position. Try lying on your side with your knees slightly bent. If you lie on your back, put a pillow under your knees.  Do not drive or use heavy machinery while taking prescription pain medicine.  If your health care provider prescribed a diet or exercise program, follow it as directed.  Keep all follow-up visits as told by your health care provider. This is important. Contact a health care provider if:  Your pain does not improve over time, even when taking pain medicines. Get help right away if:  You develop severe pain.  Your pain suddenly gets worse.  You develop increasing weakness in your legs.  You lose the ability to control your bladder or bowel.  You have difficulty walking or balancing.  You have a fever. Summary  Lumbosacral radiculopathy is a condition that occurs when the spinal nerves and nerve roots in the lower part of the spine move out of place or become inflamed and cause symptoms.  Symptoms include pain, numbness, and tingling that go down from your back into your legs (sciatica), muscle weakness, and loss of bladder control or bowel control.  If directed, apply ice or heat to the affected area as told by your health care provider.  Follow instructions about activity, rest, and proper lifting technique. This  information is not intended to replace advice given to you by your health care provider. Make sure you discuss any questions you have with your health care provider. Document Revised: 07/22/2020 Document Reviewed: 07/22/2020 Elsevier Patient Education  2021 [employer].

## 2021-02-14 NOTE — Progress Notes (Signed)
[place], [place]  [address] Phone:  [phone]   Fax:  [phone]   Established Patient Office Visit  Subjective:  Patient ID: Todd Mendoza, male    DOB: January 11, 1951  Age: 70 y.o. MRN: [id number]  CC:  Chief Complaint  Patient presents with  . Follow-up    No questions or concerns    HPI Chaske Paskett presents for follow up. He  has a past medical history of Hyperlipidemia, Hypertension, Stroke ([place]), and Vertigo.   He has lower back pain. The pain goes into his right thigh that radiates to outer portion. He admits that this pain is severe that it wakes him during the night. He has declined imaging. He is going to continue with conservative treatment at this time.   He reports that he has shortness of breath. He feels like this occurs with seating. He states that it does on stop him from completely his daily work. He is former smoker quit 7-8 years ago. He denies any chest pain.  He was stared on the Proscar to see if this would provide better urinary flow. He reports that he has not noted much of an improvement. He admits that he is only taking this occasionally . He reports that pharmacy has refilled his tamsulosin. He does not like a lot of medications.  Past Medical History:  Diagnosis Date  . Hyperlipidemia   . Hypertension   . Stroke ([place])   . Vertigo     Past Surgical History:  Procedure Laterality Date  . NO PAST SURGERIES      Family History  Problem Relation Age of Onset  . Diabetes Father   . Colon cancer Neg Hx   . Colon polyps Neg Hx   . Esophageal cancer Neg Hx   . Stomach cancer Neg Hx   . Rectal cancer Neg Hx     Social History   Socioeconomic History  . Marital status: Married    Spouse name: Not on file  . Number of children: Not on file  . Years of education: Not on file  . Highest education level: Not on file  Occupational History  . 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 Smoking  status: Former Smoker    Packs/day: 0.20    Types: Cigarettes  . Smokeless tobacco: Never Used  Vaping Use  . Vaping Use: Never used  Substance and Sexual Activity  . Alcohol use: No    Alcohol/week: 0.0 standard drinks  . Drug use: No  . Sexual activity: Yes  Other Topics Concern  . Not on file  Social History Narrative   Married   Psychologist, counselling daily   Social Determinants of Health   Financial Resource Strain: Low Risk   . Difficulty of Paying Living Expenses: Not hard at all  Food Insecurity: No Food Insecurity  . Worried About Charity fundraiser in the Last Year: Never true  . Ran Out of Food in the Last Year: Never true  Transportation Needs: No Transportation Needs  . Lack of Transportation (Medical): No  . Lack of Transportation (Non-Medical): No  Physical Activity: Sufficiently Active  . Days of Exercise per Week: 5 days  . Minutes of Exercise per Session: 30 min  Stress: No Stress Concern Present  . Feeling of Stress : Not at all  Social Connections: Socially Integrated  . Frequency of Communication with Friends and Family: More than three times a week  .  Frequency of Social Gatherings with Friends and Family: More than three times a week  . Attends Religious Services: More than 4 times per year  . Active Member of Clubs or Organizations: Yes  . Attends Archivist Meetings: More than 4 times per year  . Marital Status: Married  Human resources officer Violence: Not At Risk  . Fear of Current or Ex-Partner: No  . Emotionally Abused: No  . Physically Abused: No  . Sexually Abused: No    Outpatient Medications Prior to Visit  Medication Sig Dispense Refill  . amLODipine (NORVASC) 5 MG tablet Take 1 tablet (5 mg total) by mouth daily. 90 tablet 3  . aspirin EC 81 MG tablet Take 1 tablet (81 mg total) by mouth daily. 30 tablet 0  . atorvastatin (LIPITOR) 20 MG tablet Take 1 tablet (20 mg total) by mouth daily. 90 tablet 3  . finasteride (PROSCAR) 5 MG tablet Take 1  tablet (5 mg total) by mouth daily. 90 tablet 3  . hydrochlorothiazide (HYDRODIURIL) 25 MG tablet Take 1 tablet (25 mg total) by mouth daily. 90 tablet 3  . tamsulosin (FLOMAX) 0.4 MG CAPS capsule TAKE 1 CAPSULE BY MOUTH EVERY DAY 30 capsule 0  . Zoster Vaccine Adjuvanted [facility]) injection TO BE ADMINISTERED BY PHARMACIST 1 each 1   No facility-administered medications prior to visit.    No Known Allergies  ROS Review of Systems    Objective:    Physical Exam HENT:     Head: Normocephalic and atraumatic.     Nose: Nose normal.     Mouth/Throat:     Mouth: Mucous membranes are moist.  Cardiovascular:     Rate and Rhythm: Normal rate.     Pulses: Normal pulses.  Musculoskeletal:     Cervical back: Normal range of motion.  Skin:    General: Skin is warm and dry.     Capillary Refill: Capillary refill takes less than 2 seconds.  Neurological:     General: No focal deficit present.     Mental Status: He is alert and oriented to person, place, and time.  Psychiatric:        Mood and Affect: Mood normal.        Behavior: Behavior normal.        Thought Content: Thought content normal.        Judgment: Judgment normal.     BP 128/60 (BP Location: Right Arm, Patient Position: Sitting, Cuff Size: Large)   Pulse 62   Temp (!) 97.2 F (36.2 C)   Ht 5\' 10"  (1.778 m)   Wt 203 lb (92.1 kg)   SpO2 97%   BMI 29.13 kg/m  Wt Readings from Last 3 Encounters:  02/14/21 203 lb (92.1 kg)  12/20/20 202 lb (91.6 kg)  11/08/20 200 lb 12.8 oz (91.1 kg)     There are no preventive care reminders to display for this patient.  There are no preventive care reminders to display for this patient.  Lab Results  Component Value Date   TSH 1.39 02/13/2019   Lab Results  Component Value Date   WBC 5.2 11/08/2020   HGB 13.3 11/08/2020   HCT 41.0 11/08/2020   MCV 87 11/08/2020   PLT 249 11/08/2020   Lab Results  Component Value Date   NA 139 11/08/2020   K 4.5 11/08/2020    CO2 28 10/13/2019   GLUCOSE 85 11/08/2020   BUN 10 11/08/2020   CREATININE 1.00 11/08/2020  BILITOT 0.5 11/08/2020   ALKPHOS 87 11/08/2020   AST 32 11/08/2020   ALT 20 10/13/2019   PROT 7.4 11/08/2020   ALBUMIN 4.5 11/08/2020   CALCIUM 9.6 11/08/2020   GFR 92.71 10/13/2019   Lab Results  Component Value Date   CHOL 162 11/08/2020   Lab Results  Component Value Date   HDL 37 (L) 11/08/2020   Lab Results  Component Value Date   LDLCALC 99 11/08/2020   Lab Results  Component Value Date   TRIG 147 11/08/2020   Lab Results  Component Value Date   CHOLHDL 4.4 11/08/2020   Lab Results  Component Value Date   HGBA1C 5.4 10/13/2019      Assessment & Plan:   Problem List Items Addressed This Visit      Nervous and Auditory   Lumbar radiculopathy Persistent  Will continue to management with daily exercise and activity    Other Visit Diagnoses    Essential hypertension    -  Primary Encouraged on going compliance with current medication regimen Encouraged home monitoring and recording BP <130/80 Eating a heart-healthy diet with less salt Encouraged regular physical activity     BPH without urinary obstruction    Persistent  Will continue with flomax and Proscar alternating       No orders of the defined types were placed in this encounter.   Follow-up: No follow-ups on file.    Vevelyn Francois, NP

## 2021-02-15 ENCOUNTER — Other Ambulatory Visit: Payer: Self-pay | Admitting: Nurse Practitioner

## 2021-02-15 DIAGNOSIS — N401 Enlarged prostate with lower urinary tract symptoms: Secondary | ICD-10-CM

## 2021-03-16 ENCOUNTER — Other Ambulatory Visit: Payer: Self-pay | Admitting: Nurse Practitioner

## 2021-03-16 DIAGNOSIS — N401 Enlarged prostate with lower urinary tract symptoms: Secondary | ICD-10-CM

## 2021-04-14 ENCOUNTER — Other Ambulatory Visit: Payer: Self-pay | Admitting: Nurse Practitioner

## 2021-04-14 DIAGNOSIS — N401 Enlarged prostate with lower urinary tract symptoms: Secondary | ICD-10-CM

## 2021-04-14 DIAGNOSIS — R3912 Poor urinary stream: Secondary | ICD-10-CM

## 2021-04-28 ENCOUNTER — Other Ambulatory Visit: Payer: Self-pay | Admitting: Nurse Practitioner

## 2021-04-28 DIAGNOSIS — N401 Enlarged prostate with lower urinary tract symptoms: Secondary | ICD-10-CM

## 2021-05-18 ENCOUNTER — Other Ambulatory Visit: Payer: Self-pay

## 2021-05-18 ENCOUNTER — Ambulatory Visit (INDEPENDENT_AMBULATORY_CARE_PROVIDER_SITE_OTHER): Payer: Medicare Other | Admitting: Nurse Practitioner

## 2021-05-18 ENCOUNTER — Encounter: Payer: Self-pay | Admitting: Nurse Practitioner

## 2021-05-18 VITALS — BP 108/91 | HR 69 | Temp 97.3°F | Ht 70.0 in | Wt 206.0 lb

## 2021-05-18 DIAGNOSIS — Z13220 Encounter for screening for lipoid disorders: Secondary | ICD-10-CM

## 2021-05-18 DIAGNOSIS — N400 Enlarged prostate without lower urinary tract symptoms: Secondary | ICD-10-CM

## 2021-05-18 DIAGNOSIS — D122 Benign neoplasm of ascending colon: Secondary | ICD-10-CM

## 2021-05-18 DIAGNOSIS — R5383 Other fatigue: Secondary | ICD-10-CM | POA: Diagnosis not present

## 2021-05-18 DIAGNOSIS — I499 Cardiac arrhythmia, unspecified: Secondary | ICD-10-CM | POA: Diagnosis not present

## 2021-05-18 DIAGNOSIS — I10 Essential (primary) hypertension: Secondary | ICD-10-CM | POA: Diagnosis not present

## 2021-05-18 LAB — POCT URINALYSIS DIP (CLINITEK)
Bilirubin, UA: NEGATIVE
Blood, UA: NEGATIVE
Glucose, UA: NEGATIVE mg/dL
Ketones, POC UA: NEGATIVE mg/dL
Leukocytes, UA: NEGATIVE
Nitrite, UA: NEGATIVE
POC PROTEIN,UA: NEGATIVE
Spec Grav, UA: 1.02
Urobilinogen, UA: 0.2 U/dL
pH, UA: 5.5

## 2021-05-18 NOTE — Patient Instructions (Signed)

## 2021-05-18 NOTE — Progress Notes (Signed)
[place], [place]  [id number] Phone:  [phone]   Fax:  [phone]   Established Patient Office Visit  Subjective:  Patient ID: Todd Mendoza, male    DOB: 08-18-1951  Age: 70 y.o. MRN: [id number]  CC:  Chief Complaint  Patient presents with   Follow-up    3 follow up; a lot of fatigue low energy past week. Low back pain right side.     HPI [name] presents for follow up. He  has a past medical history of Hyperlipidemia, Hypertension, Stroke ([place]), and Vertigo.   Fatigue Patient complains of fatigue. Symptoms began a week ago. The patient feels the fatigue has been gradually coming on.  He excepted a job as a Librarian, academic at [employer].  This is increased in hours and responsibilities.  He reports that he likes to job however do not know if he will continue to maintain it.. Symptoms of his fatigue have been general malaise. Patient describes the following psychological symptoms: none. Patient denies change in hair texture, cold intolerance, constipation, exercise intolerance, fever, GI blood loss, significant change in weight, symptoms of arthritis, unusual rashes, and witnessed or suspected sleep apnea. Symptoms have waxed and waned. Symptom severity: symptoms bothersome, but easily able to carry out all usual work/school/family activities. Previous visits for this problem: none. He reports that between 4:30 and 5:00 pm this week his symptoms are the worst. Past Medical History:  Diagnosis Date   Hyperlipidemia    Hypertension    Stroke ([place])    Vertigo     Past Surgical History:  Procedure Laterality Date   NO PAST SURGERIES      Family History  Problem Relation Age of Onset   Diabetes Father    Colon cancer Neg Hx    Colon polyps Neg Hx    Esophageal cancer Neg Hx    Stomach cancer Neg Hx    Rectal cancer Neg Hx     Social History   Socioeconomic History   Marital status: Married    Spouse name: Not on file    Number of children: Not on file   Years of education: Not on file   Highest education level: Not on file  Occupational History   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[employer]  Tobacco Use   Smoking status: Former    Packs/day: 0.20    Types: Cigarettes   Smokeless tobacco: Never  Vaping Use   Vaping Use: Never used  Substance and Sexual Activity   Alcohol use: No    Alcohol/week: 0.0 standard drinks   Drug use: No   Sexual activity: Yes  Other Topics Concern   Not on file  Social History Narrative   Married   Psychologist, counselling daily   Social Determinants of Health   Financial Resource Strain: Not on file  Food Insecurity: Not on file  Transportation Needs: Not on file  Physical Activity: Not on file  Stress: Not on file  Social Connections: Not on file  Intimate Partner Violence: Not on file    Outpatient Medications Prior to Visit  Medication Sig Dispense Refill   amLODipine (NORVASC) 5 MG tablet Take 1 tablet (5 mg total) by mouth daily. 90 tablet 3   aspirin EC 81 MG tablet Take 1 tablet (81 mg total) by mouth daily. 30 tablet 0   atorvastatin (LIPITOR) 20 MG tablet Take 1 tablet (20 mg total) by mouth daily. 90 tablet 3   finasteride (PROSCAR)  5 MG tablet Take 1 tablet (5 mg total) by mouth daily. 90 tablet 3   hydrochlorothiazide (HYDRODIURIL) 25 MG tablet Take 1 tablet (25 mg total) by mouth daily. 90 tablet 3   tamsulosin (FLOMAX) 0.4 MG CAPS capsule TAKE 1 CAPSULE BY MOUTH EVERY DAY 30 capsule 0   No facility-administered medications prior to visit.    No Known Allergies  ROS Review of Systems  Respiratory:  Positive for shortness of breath.   Cardiovascular:  Negative for chest pain and leg swelling.     Objective:    Physical Exam HENT:     Head: Normocephalic and atraumatic.     Nose: Nose normal.     Mouth/Throat:     Mouth: Mucous membranes are moist.  Cardiovascular:     Rate and Rhythm: Normal rate.     Pulses: Normal pulses.     Comments: Regular irregularity  persistent Musculoskeletal:     Cervical back: Normal range of motion.  Skin:    General: Skin is warm and dry.     Capillary Refill: Capillary refill takes less than 2 seconds.  Neurological:     General: No focal deficit present.     Mental Status: He is alert and oriented to person, place, and time.  Psychiatric:        Mood and Affect: Mood normal.        Behavior: Behavior normal.        Thought Content: Thought content normal.        Judgment: Judgment normal.    BP (!) 108/91 (BP Location: Right Arm, Patient Position: Sitting)   Pulse 69   Temp (!) 97.3 F (36.3 C)   Ht '5\' 10"'$  (1.778 m)   Wt 206 lb (93.4 kg)   SpO2 97%   BMI 29.56 kg/m  Wt Readings from Last 3 Encounters:  05/18/21 206 lb (93.4 kg)  02/14/21 203 lb (92.1 kg)  12/20/20 202 lb (91.6 kg)     Health Maintenance Due  Topic Date Due   COVID-19 Vaccine (3 - Pfizer risk series) 01/20/2020   INFLUENZA VACCINE  04/25/2021    There are no preventive care reminders to display for this patient.  Lab Results  Component Value Date   TSH 1.39 02/13/2019   Lab Results  Component Value Date   WBC 5.2 11/08/2020   HGB 13.3 11/08/2020   HCT 41.0 11/08/2020   MCV 87 11/08/2020   PLT 249 11/08/2020   Lab Results  Component Value Date   NA 139 11/08/2020   K 4.5 11/08/2020   CO2 28 10/13/2019   GLUCOSE 85 11/08/2020   BUN 10 11/08/2020   CREATININE 1.00 11/08/2020   BILITOT 0.5 11/08/2020   ALKPHOS 87 11/08/2020   AST 32 11/08/2020   ALT 20 10/13/2019   PROT 7.4 11/08/2020   ALBUMIN 4.5 11/08/2020   CALCIUM 9.6 11/08/2020   GFR 92.71 10/13/2019   Lab Results  Component Value Date   CHOL 162 11/08/2020   Lab Results  Component Value Date   HDL 37 (L) 11/08/2020   Lab Results  Component Value Date   LDLCALC 99 11/08/2020   Lab Results  Component Value Date   TRIG 147 11/08/2020   Lab Results  Component Value Date   CHOLHDL 4.4 11/08/2020   Lab Results  Component Value Date    HGBA1C 5.4 10/13/2019      Assessment & Plan:   Problem List Items Addressed This Visit  None Visit Diagnoses     Fatigue, unspecified type    -  Primary Ongoing Further evaluation with labs Did discuss possible cardiac work-up if symptoms persist   Relevant Orders   CBC with Differential/Platelet (Completed)   TSH (Completed)   VITAMIN D 25 Hydroxy (Vit-D Deficiency, Fractures) (Completed)   Vitamin B12 (Completed)   EKG 12-Lead   Essential hypertension        Relevant Orders   Comp. Metabolic Panel (12) (Completed)   POCT URINALYSIS DIP (CLINITEK) (Completed)   Adenomatous polyp of ascending colon     History of referral for repeat colonoscopy   Relevant Orders   Ambulatory referral to Gastroenterology   Screening for cholesterol level       Relevant Orders   Lipid panel (Completed)   BPH without urinary obstruction     Persistent Finasteride discontinued Continue with tamsulosin 0.4 mg Discussed with patient possible side effects of changes in blood pressure Referral to urology for further evaluation due to failed treatment options   Relevant Orders   Ambulatory referral to Urology   Irregular heart beat     Normal sinus Will evaluate labs   Relevant Orders   EKG 12-Lead       No orders of the defined types were placed in this encounter.   Follow-up: No follow-ups on file.    Vevelyn Francois, NP

## 2021-05-19 LAB — CBC WITH DIFFERENTIAL/PLATELET
Basophils Absolute: 0 10*3/uL (ref 0.0–0.2)
Basos: 1 %
EOS (ABSOLUTE): 0.4 10*3/uL (ref 0.0–0.4)
Eos: 6 %
Hematocrit: 38.5 % (ref 37.5–51.0)
Hemoglobin: 12.7 g/dL — ABNORMAL LOW (ref 13.0–17.7)
Immature Grans (Abs): 0 10*3/uL (ref 0.0–0.1)
Immature Granulocytes: 0 %
Lymphocytes Absolute: 2.6 10*3/uL (ref 0.7–3.1)
Lymphs: 45 %
MCH: 28.7 pg (ref 26.6–33.0)
MCHC: 33 g/dL (ref 31.5–35.7)
MCV: 87 fL (ref 79–97)
Monocytes Absolute: 0.5 10*3/uL (ref 0.1–0.9)
Monocytes: 9 %
Neutrophils Absolute: 2.3 10*3/uL (ref 1.4–7.0)
Neutrophils: 39 %
Platelets: 208 10*3/uL (ref 150–450)
RBC: 4.42 x10E6/uL (ref 4.14–5.80)
RDW: 11.8 % (ref 11.6–15.4)
WBC: 5.7 10*3/uL (ref 3.4–10.8)

## 2021-05-19 LAB — COMP. METABOLIC PANEL (12)
AST: 26 IU/L (ref 0–40)
Albumin/Globulin Ratio: 1.4 (ref 1.2–2.2)
Albumin: 4.4 g/dL (ref 3.7–4.7)
Alkaline Phosphatase: 94 IU/L (ref 44–121)
BUN/Creatinine Ratio: 10 (ref 10–24)
BUN: 9 mg/dL (ref 8–27)
Bilirubin Total: 0.4 mg/dL (ref 0.0–1.2)
Calcium: 9.7 mg/dL (ref 8.6–10.2)
Chloride: 102 mmol/L (ref 96–106)
Creatinine, Ser: 0.92 mg/dL (ref 0.76–1.27)
Globulin, Total: 3.1 g/dL (ref 1.5–4.5)
Glucose: 66 mg/dL (ref 65–99)
Potassium: 4.4 mmol/L (ref 3.5–5.2)
Sodium: 141 mmol/L (ref 134–144)
Total Protein: 7.5 g/dL (ref 6.0–8.5)
eGFR: 89 mL/min/{1.73_m2} (ref >59)

## 2021-05-19 LAB — LIPID PANEL
Chol/HDL Ratio: 4.7 ratio (ref 0.0–5.0)
Cholesterol, Total: 169 mg/dL (ref 100–199)
HDL: 36 mg/dL — ABNORMAL LOW (ref >39)
LDL Chol Calc (NIH): 117 mg/dL — ABNORMAL HIGH (ref 0–99)
Triglycerides: 83 mg/dL (ref 0–149)
VLDL Cholesterol Cal: 16 mg/dL (ref 5–40)

## 2021-05-19 LAB — TSH: TSH: 1.5 u[IU]/mL (ref 0.450–4.500)

## 2021-05-19 LAB — VITAMIN B12: Vitamin B-12: 491 pg/mL (ref 232–1245)

## 2021-05-19 LAB — VITAMIN D 25 HYDROXY (VIT D DEFICIENCY, FRACTURES): Vit D, 25-Hydroxy: 29.5 ng/mL — ABNORMAL LOW (ref 30.0–100.0)

## 2021-05-20 NOTE — Progress Notes (Signed)
Pt and patients wife is aware of his lab results.

## 2021-05-20 NOTE — Progress Notes (Signed)
Attempted to call the patient about lab results, but no answer Left a vm for the patient to call our office back.

## 2021-06-10 ENCOUNTER — Encounter: Payer: Self-pay | Admitting: Gastroenterology

## 2021-06-10 ENCOUNTER — Telehealth: Payer: Self-pay | Admitting: Nurse Practitioner

## 2021-06-10 NOTE — Telephone Encounter (Signed)
Gastro and Urology appointments scheduled information was given to patient wife.

## 2021-06-10 NOTE — Telephone Encounter (Signed)
Pt's wife inquiring about last referral status, highlights it's been over 2 weeks and she's worried she hasn't heard anything back from either office. Pt wants to know location and number to contact to schedule appt for her husband  asap. Please advise and thank you

## 2021-06-19 ENCOUNTER — Ambulatory Visit (INDEPENDENT_AMBULATORY_CARE_PROVIDER_SITE_OTHER): Payer: Medicare Other | Admitting: Nurse Practitioner

## 2021-06-19 DIAGNOSIS — Z0000 Encounter for general adult medical examination without abnormal findings: Secondary | ICD-10-CM | POA: Diagnosis not present

## 2021-06-19 NOTE — Progress Notes (Signed)
Subjective:   Todd Mendoza is a 70 y.o. male who presents for an Initial Medicare Annual Wellness Visit.  I connected with  Todd Mendoza on 06/19/21 by audio enabled telemedicine application and verified that I am speaking with the correct person using two identifiers.   I discussed the limitations of evaluation and management by telemedicine. The patient expressed understanding and agreed to proceed.    Location of patient: home Location of provider: home People participating in visit: [name] and [name], [place] Review of Systems    Defer to PCP       Objective:    There were no vitals filed for this visit. There is no height or weight on file to calculate BMI.  Advanced Directives 03/25/2020 03/25/2019 10/07/2018 09/05/2018 03/19/2018 04/02/2017 03/15/2017  Does Patient Have a Medical Advance Directive? Yes Yes Yes No Yes No No  Type of Advance Directive - [place];Living will Healthcare Power of [place];Living will - -  Does patient want to make changes to medical advance directive? No - Patient declined - No - Patient declined - - - -  Copy of [place] in Chart? - No - copy requested No - copy requested - No - copy requested - -  Would patient like information on creating a medical advance directive? - - - No - Patient declined - No - Patient declined Yes (ED - Information included in AVS)    Current Medications (verified) Outpatient Encounter Medications as of 06/19/2021  Medication Sig   amLODipine (NORVASC) 5 MG tablet Take 1 tablet (5 mg total) by mouth daily.   aspirin EC 81 MG tablet Take 1 tablet (81 mg total) by mouth daily.   atorvastatin (LIPITOR) 20 MG tablet Take 1 tablet (20 mg total) by mouth daily.   finasteride (PROSCAR) 5 MG tablet Take 1 tablet (5 mg total) by mouth daily.   hydrochlorothiazide (HYDRODIURIL) 25 MG tablet Take 1 tablet (25 mg total) by mouth daily.   tamsulosin (FLOMAX) 0.4 MG  CAPS capsule TAKE 1 CAPSULE BY MOUTH EVERY DAY   No facility-administered encounter medications on file as of 06/19/2021.    Allergies (verified) Patient has no known allergies.   History: Past Medical History:  Diagnosis Date   Hyperlipidemia    Hypertension    Stroke ([place])    Vertigo    Past Surgical History:  Procedure Laterality Date   NO PAST SURGERIES     Family History  Problem Relation Age of Onset   Diabetes Father    Colon cancer Neg Hx    Colon polyps Neg Hx    Esophageal cancer Neg Hx    Stomach cancer Neg Hx    Rectal cancer Neg Hx    Social History   Socioeconomic History   Marital status: Married    Spouse name: Not on file   Number of children: Not on file   Years of education: Not on file   Highest education level: Not on file  Occupational History   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[employer]  Tobacco Use   Smoking status: Former    Packs/day: 0.20    Types: Cigarettes   Smokeless tobacco: Never  Vaping Use   Vaping Use: Never used  Substance and Sexual Activity   Alcohol use: No    Alcohol/week: 0.0 standard drinks   Drug use: No   Sexual activity: Yes  Other Topics Concern   Not on file  Social History Narrative   Married   Psychologist, counselling daily   Social Determinants of Health   Financial Resource Strain: Not on file  Food Insecurity: Not on file  Transportation Needs: Not on file  Physical Activity: Not on file  Stress: Not on file  Social Connections: Not on file    Tobacco Counseling Counseling given: Not Answered   Clinical Intake:                 Diabetic?no         Activities of Daily Living No flowsheet data found.  Patient Care Team: Vevelyn Francois, NP as PCP - General (Adult Health Nurse Practitioner)  Indicate any recent Medical Services you may have received from other than [facility] providers in the past year (date may be approximate).     Assessment:   This is a routine wellness examination for  .  Hearing/Vision screen No results found.  Dietary issues and exercise activities discussed:     Goals Addressed   None    Depression Screen PHQ 2/9 Scores 05/18/2021 12/20/2020 03/25/2020 03/25/2019 10/09/2018 03/19/2018 01/08/2018  PHQ - 2 Score 0 0 0 0 0 0 0    Fall Risk Fall Risk  05/18/2021 02/14/2021 12/20/2020 03/25/2020 03/25/2019  Falls in the past year? 0 0 0 0 0  Number falls in past yr: 0 0 0 0 -  Injury with Fall? 0 0 0 0 -  Risk for fall due to : - - - No Fall Risks -  Follow up - - - Falls evaluation completed;Education provided -    FALL RISK PREVENTION PERTAINING TO THE HOME:  Any stairs in or around the home? No  If so, are there any without handrails? Yes  Home free of loose throw rugs in walkways, pet beds, electrical cords, etc? Yes  Adequate lighting in your home to reduce risk of falls? Yes   ASSISTIVE DEVICES UTILIZED TO PREVENT FALLS:  Life alert? No  Use of a cane, walker or w/c? No  Grab bars in the bathroom? No  Shower chair or bench in shower? No  Elevated toilet seat or a handicapped toilet? No   TIMED UP AND GO:  Was the test performed? No .  Length of time to ambulate 10 feet: n/a sec.    Cognitive Function:        Immunizations Immunization History  Administered Date(s) Administered   Fluad Quad(high Dose 65+) 06/24/2019, 06/21/2020   Influenza, High Dose Seasonal PF 09/17/2018   PFIZER(Purple Top)SARS-COV-2 Vaccination 11/24/2019, 12/23/2019   Pneumococcal Conjugate-13 04/11/2016   Pneumococcal Polysaccharide-23 05/15/2017   Tdap 04/02/2017   Zoster Recombinat (Shingrix) 10/18/2020, 12/20/2020    TDAP status: Up to date  Flu Vaccine status: Declined, Education has been provided regarding the importance of this vaccine but patient still declined. Advised may receive this vaccine at local pharmacy or Health Dept. Aware to provide a copy of the vaccination record if obtained from local pharmacy or Health Dept. Verbalized  acceptance and understanding.   Covid-19 vaccine status: Completed vaccines  Qualifies for Shingles Vaccine? Yes   Zostavax completed Yes   Shingrix Completed?: Yes  Screening Tests Health Maintenance  Topic Date Due   COVID-19 Vaccine (3 - Pfizer risk series) 01/20/2020   INFLUENZA VACCINE  04/25/2021   COLONOSCOPY (Pts 45-64yrs Insurance coverage will need to be confirmed)  07/31/2021   TETANUS/TDAP  04/03/2027   Hepatitis C Screening  Completed   Zoster Vaccines- Shingrix  Completed  HPV VACCINES  Aged Out    Health Maintenance  Health Maintenance Due  Topic Date Due   COVID-19 Vaccine (3 - Pfizer risk series) 01/20/2020   INFLUENZA VACCINE  04/25/2021    Colorectal cancer screening: Type of screening: Colonoscopy. Completed 07/31/2018. Repeat every 3 years  Lung Cancer Screening: (Low Dose CT Chest recommended if Age 70-80 years, 30 pack-year currently smoking OR have quit w/in 15years.) does not qualify.   Lung Cancer Screening Referral: no  Additional Screening:  Hepatitis C Screening: does qualify; Completed 10/11/2019  Vision Screening: Recommended annual ophthalmology exams for early detection of glaucoma and other disorders of the eye. Is the patient up to date with their annual eye exam?  No  Who is the provider or what is the name of the office in which the patient attends annual eye exams? N/a If pt is not established with a provider, would they like to be referred to a provider to establish care? No .   Dental Screening: Recommended annual dental exams for proper oral hygiene  Community Resource Referral / Chronic Care Management: CRR required this visit?  No   CCM required this visit?  No      Plan:     I have personally reviewed and noted the following in the patient's chart:   Medical and social history Use of alcohol, tobacco or illicit drugs  Current medications and supplements including opioid prescriptions. Patient is not currently  taking opioid prescriptions. Functional ability and status Nutritional status Physical activity Advanced directives List of other physicians Hospitalizations, surgeries, and ER visits in previous 12 months Vitals Screenings to include cognitive, depression, and falls Referrals and appointments  In addition, I have reviewed and discussed with patient certain preventive protocols, quality metrics, and best practice recommendations. A written personalized care plan for preventive services as well as general preventive health recommendations were provided to patient.     Brien Few, [place]   06/19/2021   Nurse Notes: non face to face 60 minute visit.   Mr. Schorr , Thank you for taking time to come for your Medicare Wellness Visit. I appreciate your ongoing commitment to your health goals. Please review the following plan we discussed and let me know if I can assist you in the future.   These are the goals we discussed:  Goals       Patient Stated      I want to increase my physical activity by starting to go back to the [facility] twice weekly. Watch my diet for fat and salt. 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       Patient Stated (pt-stated)      To  lose 25-30 pounds      Weight (lb) < 200 lb (90.7 kg)      Continue to exercise and eat as healthy as possible        This is a list of the screening recommended for you and due dates:  Health Maintenance  Topic Date Due   COVID-19 Vaccine (3 - Pfizer risk series) 01/20/2020   Flu Shot  04/25/2021   Colon Cancer Screening  07/31/2021   Tetanus Vaccine  04/03/2027   Hepatitis C Screening: USPSTF Recommendation to screen - Ages 18-79 yo.  Completed   Zoster (Shingles) Vaccine  Completed   HPV Vaccine  Aged Out

## 2021-07-12 ENCOUNTER — Ambulatory Visit (AMBULATORY_SURGERY_CENTER): Payer: Medicare Other

## 2021-07-12 ENCOUNTER — Encounter: Payer: Self-pay | Admitting: Gastroenterology

## 2021-07-12 ENCOUNTER — Other Ambulatory Visit: Payer: Self-pay

## 2021-07-12 VITALS — Ht 70.0 in | Wt 204.0 lb

## 2021-07-12 DIAGNOSIS — Z86010 Personal history of colonic polyps: Secondary | ICD-10-CM

## 2021-07-12 NOTE — Progress Notes (Signed)
No allergies to soy or egg Pt is not on blood thinners or diet pills Denies issues with sedation/intubation- has never had surgeries on the procedure-colonoscopy Denies atrial flutter/fib Denies constipation    Pt is aware of Covid safety and care partner requirements.  Wife Verdene Lennert is present with pt.

## 2021-07-18 DIAGNOSIS — R3912 Poor urinary stream: Secondary | ICD-10-CM | POA: Diagnosis not present

## 2021-07-27 ENCOUNTER — Ambulatory Visit (AMBULATORY_SURGERY_CENTER): Payer: Medicare Other | Admitting: Gastroenterology

## 2021-07-27 ENCOUNTER — Other Ambulatory Visit: Payer: Self-pay

## 2021-07-27 ENCOUNTER — Encounter: Payer: Self-pay | Admitting: Gastroenterology

## 2021-07-27 VITALS — BP 130/87 | HR 58 | Temp 97.2°F | Resp 12 | Ht 70.0 in | Wt 204.0 lb

## 2021-07-27 DIAGNOSIS — K633 Ulcer of intestine: Secondary | ICD-10-CM

## 2021-07-27 DIAGNOSIS — D122 Benign neoplasm of ascending colon: Secondary | ICD-10-CM

## 2021-07-27 DIAGNOSIS — Z86010 Personal history of colonic polyps: Secondary | ICD-10-CM | POA: Diagnosis not present

## 2021-07-27 DIAGNOSIS — D123 Benign neoplasm of transverse colon: Secondary | ICD-10-CM

## 2021-07-27 DIAGNOSIS — K5289 Other specified noninfective gastroenteritis and colitis: Secondary | ICD-10-CM

## 2021-07-27 DIAGNOSIS — D128 Benign neoplasm of rectum: Secondary | ICD-10-CM

## 2021-07-27 DIAGNOSIS — D129 Benign neoplasm of anus and anal canal: Secondary | ICD-10-CM

## 2021-07-27 DIAGNOSIS — K5190 Ulcerative colitis, unspecified, without complications: Secondary | ICD-10-CM | POA: Diagnosis not present

## 2021-07-27 MED ORDER — SODIUM CHLORIDE 0.9 % IV SOLN
500.0000 mL | Freq: Once | INTRAVENOUS | Status: DC
Start: 2021-07-27 — End: 2021-07-27

## 2021-07-27 NOTE — Patient Instructions (Signed)
Discharge instructions given. Handouts on polyps,diverticulosis and hemorrhoids. Resume previous medications. YOU HAD AN ENDOSCOPIC PROCEDURE TODAY AT [facility]:   Refer to the procedure report that was given to you for any specific questions about what was found during the examination.  If the procedure report does not answer your questions, please call your gastroenterologist to clarify.  If you requested that your care partner not be given the details of your procedure findings, then the procedure report has been included in a sealed envelope for you to review at your convenience later.  YOU SHOULD EXPECT: Some feelings of bloating in the abdomen. Passage of more gas than usual.  Walking can help get rid of the air that was put into your GI tract during the procedure and reduce the bloating. If you had a lower endoscopy (such as a colonoscopy or flexible sigmoidoscopy) you may notice spotting of blood in your stool or on the toilet paper. If you underwent a bowel prep for your procedure, you may not have a normal bowel movement for a few days.  Please Note:  You might notice some irritation and congestion in your nose or some drainage.  This is from the oxygen used during your procedure.  There is no need for concern and it should clear up in a day or so.  SYMPTOMS TO REPORT IMMEDIATELY:  Following lower endoscopy (colonoscopy or flexible sigmoidoscopy):  Excessive amounts of blood in the stool  Significant tenderness or worsening of abdominal pains  Swelling of the abdomen that is new, acute  Fever of 100F or higher  For urgent or emergent issues, a gastroenterologist can be reached at any hour by calling [phone]. Do not use MyChart messaging for urgent concerns.    DIET:  We do recommend a small meal at first, but then you may proceed to your regular diet.  Drink plenty of fluids but you should avoid alcoholic beverages for 24 hours.  ACTIVITY:  You should plan  to take it easy for the rest of today and you should NOT DRIVE or use heavy machinery until tomorrow (because of the sedation medicines used during the test).    FOLLOW UP: Our staff will call the number listed on your records 48-72 hours following your procedure to check on you and address any questions or concerns that you may have regarding the information given to you following your procedure. If we do not reach you, we will leave a message.  We will attempt to reach you two times.  During this call, we will ask if you have developed any symptoms of COVID 19. If you develop any symptoms (ie: fever, flu-like symptoms, shortness of breath, cough etc.) before then, please call [phone].  If you test positive for Covid 19 in the 2 weeks post procedure, please call and report this information to [place].    If any biopsies were taken you will be contacted by phone or by letter within the next 1-3 weeks.  Please call us at [phone] if you have not heard about the biopsies in 3 weeks.    SIGNATURES/CONFIDENTIALITY: You and/or your care partner have signed paperwork which will be entered into your electronic medical record.  These signatures attest to the fact that that the information above on your After Visit Summary has been reviewed and is understood.  Full responsibility of the confidentiality of this discharge information lies with you and/or your care-partner.

## 2021-07-27 NOTE — Progress Notes (Signed)
GASTROENTEROLOGY PROCEDURE H&P NOTE   Primary Care Physician: Vevelyn Francois, NP  HPI: Todd Mendoza is a 70 y.o. male who presents for Colonoscopy for surveillance prior colon polyps last in 2019.  Past Medical History:  Diagnosis Date   Hyperlipidemia    Hypertension    Stroke [facility])    Vertigo    Past Surgical History:  Procedure Laterality Date   COLONOSCOPY     NO PAST SURGERIES     Current Outpatient Medications  Medication Sig Dispense Refill   amLODipine (NORVASC) 5 MG tablet Take 1 tablet (5 mg total) by mouth daily. 90 tablet 3   aspirin EC 81 MG tablet Take 1 tablet (81 mg total) by mouth daily. 30 tablet 0   atorvastatin (LIPITOR) 20 MG tablet Take 1 tablet (20 mg total) by mouth daily. 90 tablet 3   finasteride (PROSCAR) 5 MG tablet Take 1 tablet (5 mg total) by mouth daily. 90 tablet 3   hydrochlorothiazide (HYDRODIURIL) 25 MG tablet Take 1 tablet (25 mg total) by mouth daily. 90 tablet 3   tamsulosin (FLOMAX) 0.4 MG CAPS capsule TAKE 1 CAPSULE BY MOUTH EVERY DAY 30 capsule 0   No current facility-administered medications for this visit.    Current Outpatient Medications:    amLODipine (NORVASC) 5 MG tablet, Take 1 tablet (5 mg total) by mouth daily., Disp: 90 tablet, Rfl: 3   aspirin EC 81 MG tablet, Take 1 tablet (81 mg total) by mouth daily., Disp: 30 tablet, Rfl: 0   atorvastatin (LIPITOR) 20 MG tablet, Take 1 tablet (20 mg total) by mouth daily., Disp: 90 tablet, Rfl: 3   finasteride (PROSCAR) 5 MG tablet, Take 1 tablet (5 mg total) by mouth daily., Disp: 90 tablet, Rfl: 3   hydrochlorothiazide (HYDRODIURIL) 25 MG tablet, Take 1 tablet (25 mg total) by mouth daily., Disp: 90 tablet, Rfl: 3   tamsulosin (FLOMAX) 0.4 MG CAPS capsule, TAKE 1 CAPSULE BY MOUTH EVERY DAY, Disp: 30 capsule, Rfl: 0 No Known Allergies Family History  Problem Relation Age of Onset   Diabetes Father    Colon cancer Neg Hx    Colon polyps Neg Hx    Esophageal cancer Neg Hx     Stomach cancer Neg Hx    Rectal cancer Neg Hx    Social History   Socioeconomic History   Marital status: Married    Spouse name: Not on file   Number of children: Not on file   Years of education: Not on file   Highest education level: Not on file  Occupational History   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[employer]  Tobacco Use   Smoking status: Former    Packs/day: 0.20    Types: Cigarettes   Smokeless tobacco: Never  Vaping Use   Vaping Use: Never used  Substance and Sexual Activity   Alcohol use: No    Alcohol/week: 0.0 standard drinks   Drug use: No   Sexual activity: Yes  Other Topics Concern   Not on file  Social History Narrative   Married   Psychologist, counselling daily   Social Determinants of Health   Financial Resource Strain: Low Risk    Difficulty of Paying Living Expenses: Not hard at all  Food Insecurity: No Food Insecurity   Worried About Charity fundraiser in the Last Year: Never true   Ran Out of Food in the Last Year: Never true  Transportation Needs: No Transportation Needs   Lack of Transportation (Medical): No  Lack of Transportation (Non-Medical): No  Physical Activity: Sufficiently Active   Days of Exercise per Week: 4 days   Minutes of Exercise per Session: 40 min  Stress: Not on file  Social Connections: Moderately Integrated   Frequency of Communication with Friends and Family: Three times a week   Frequency of Social Gatherings with Friends and Family: Three times a week   Attends Religious Services: More than 4 times per year   Active Member of Clubs or Organizations: No   Attends Archivist Meetings: Never   Marital Status: Married  Human resources officer Violence: Not At Risk   Fear of Current or Ex-Partner: No   Emotionally Abused: No   Physically Abused: No   Sexually Abused: No    Physical Exam: There were no vitals filed for this visit. There is no height or weight on file to calculate BMI. GEN: NAD EYE: Sclerae anicteric ENT: MMM CV:  Non-tachycardic GI: Soft, NT/ND NEURO:  Alert & Oriented x 3  Lab Results: No results for input(s): WBC, HGB, HCT, PLT in the last 72 hours. BMET No results for input(s): NA, K, CL, CO2, GLUCOSE, BUN, CREATININE, CALCIUM in the last 72 hours. LFT No results for input(s): PROT, ALBUMIN, AST, ALT, ALKPHOS, BILITOT, BILIDIR, IBILI in the last 72 hours. PT/INR No results for input(s): LABPROT, INR in the last 72 hours.   Impression / Plan: This is a 70 y.o.male who presents for Colonoscopy for surveillance prior colon polyps last in 2019.  The risks and benefits of endoscopic evaluation/treatment were discussed with the patient and/or family; these include but are not limited to the risk of perforation, infection, bleeding, missed lesions, lack of diagnosis, severe illness requiring hospitalization, as well as anesthesia and sedation related illnesses.  The patient's history has been reviewed, patient examined, no change in status, and deemed stable for procedure.  The patient and/or family is agreeable to proceed.    Justice Britain, MD [facility] Advanced Endoscopy Office # [phone]

## 2021-07-27 NOTE — Op Note (Signed)
[facility] Patient Name: Todd Mendoza Procedure Date: 07/27/2021 12:15 PM MRN: [id number] Endoscopist: Justice Britain , MD Age: 70 Referring MD:  Date of Birth: 04-15-1951 Gender: Male Account #: [id number] Procedure:                Colonoscopy Indications:              Surveillance: Personal history of adenomatous                            polyps on last colonoscopy 3 years ago Medicines:                Monitored Anesthesia Care Procedure:                Pre-Anesthesia Assessment:                           - Prior to the procedure, a History and Physical                            was performed, and patient medications and                            allergies were reviewed. The patient's tolerance of                            previous anesthesia was also reviewed. The risks                            and benefits of the procedure and the sedation                            options and risks were discussed with the patient.                            All questions were answered, and informed consent                            was obtained. Prior Anticoagulants: The patient has                            taken no previous anticoagulant or antiplatelet                            agents except for aspirin. ASA Grade Assessment:                            III - A patient with severe systemic disease. After                            reviewing the risks and benefits, the patient was                            deemed in satisfactory condition to undergo the  procedure.                           After obtaining informed consent, the colonoscope                            was passed under direct vision. Throughout the                            procedure, the patient's blood pressure, pulse, and                            oxygen saturations were monitored continuously. The                            CF [id number] #[id number] was introduced through the anus                             and advanced to the 8 cm into the ileum. The                            colonoscopy was performed without difficulty. The                            patient tolerated the procedure. The quality of the                            bowel preparation was adequate. The terminal ileum,                            ileocecal valve, appendiceal orifice, and rectum                            were photographed. Scope In: 12:30:49 PM Scope Out: 89:21:19 PM Scope Withdrawal Time: 0 hours 14 minutes 47 seconds  Total Procedure Duration: 0 hours 17 minutes 53 seconds  Findings:                 The digital rectal exam findings include                            hemorrhoids. Pertinent negatives include no                            palpable rectal lesions.                           The terminal ileum appeared normal. Biopsies were                            taken with a cold forceps for histology.                           Segmental moderate inflammation characterized by  congestion (edema), erosions, friability,                            granularity and shallow ulcerations was found in                            the cecum and at the colonic side of the ileocecal                            valve. Biopsies were taken with a cold forceps for                            histology.                           Three sessile polyps were found in the rectum,                            hepatic flexure and ascending colon. The polyps                            were 2 to 5 mm in size. These polyps were removed                            with a cold snare. Resection and retrieval were                            complete.                           Multiple small-mouthed diverticula were found in                            the recto-sigmoid colon, sigmoid colon and                            descending colon.                           Normal mucosa was found in the  entire colon                            otherwise. Biopsies were taken with a cold forceps                            for histology to rule out chronic colitis.                           Non-bleeding non-thrombosed external and internal                            hemorrhoids were found during retroflexion, during                            perianal exam and  during digital exam. The                            hemorrhoids were Grade II (internal hemorrhoids                            that prolapse but reduce spontaneously). Complications:            No immediate complications. Estimated Blood Loss:     Estimated blood loss was minimal. Impression:               - Hemorrhoids found on digital rectal exam.                           - The examined portion of the ileum was normal.                            Biopsied.                           - Segmental moderate inflammation was found in the                            cecum and on the colonic side of the ileocecal                            valve secondary to colitis. Biopsied.                           - Three 2 to 5 mm polyps in the rectum, at the                            hepatic flexure and in the ascending colon, removed                            with a cold snare. Resected and retrieved.                           - Diverticulosis in the recto-sigmoid colon, in the                            sigmoid colon and in the descending colon.                           - Normal mucosa in the entire examined colon.                            Biopsied to rule out chronic colitis.                           - Non-bleeding non-thrombosed external and internal                            hemorrhoids. Recommendation:           - The patient will  be observed post-procedure,                            until all discharge criteria are met.                           - Discharge patient to home.                           - Patient has a contact number  available for                            emergencies. The signs and symptoms of potential                            delayed complications were discussed with the                            patient. Return to normal activities tomorrow.                            Written discharge instructions were provided to the                            patient.                           - High fiber diet.                           - Use FiberCon 1-2 tablets PO daily.                           - Continue present medications.                           - Await pathology results.                           - Repeat colonoscopy for surveillance based on                            pathology results. Pending the polyp tissue and any                            findings of colitis.                           - The findings and recommendations were discussed                            with the patient.                           - The findings and recommendations were discussed  with the patient's family. Justice Britain, MD 07/27/2021 12:55:31 PM

## 2021-07-27 NOTE — Progress Notes (Signed)
Sedate, gd SR, tolerated procedure well, VSS, report to RN 

## 2021-07-27 NOTE — Progress Notes (Signed)
Pt's states no medical or surgical changes since previsit or office visit.   CHECK-IN-AER  V/S-CW

## 2021-07-29 ENCOUNTER — Telehealth: Payer: Self-pay

## 2021-07-29 NOTE — Telephone Encounter (Signed)
  Follow up Call-  Call back number 07/27/2021  Post procedure Call Back phone  # [phone] TO SPEAK WITH WIFE  Permission to leave phone message Yes  Some recent data might be hidden     Patient questions:  Do you have a fever, pain , or abdominal swelling? No. Pain Score  0 *  Have you tolerated food without any problems? Yes.    Have you been able to return to your normal activities? Yes.    Do you have any questions about your discharge instructions: Diet   No. Medications  No. Follow up visit  No.  Do you have questions or concerns about your Care? No.  Actions: * If pain score is 4 or above: No action needed, pain <4.  Have you developed a fever since your procedure? no  2.   Have you had an respiratory symptoms (SOB or cough) since your procedure? no  3.   Have you tested positive for COVID 19 since your procedure no  4.   Have you had any family members/close contacts diagnosed with the COVID 19 since your procedure?  no   If yes to any of these questions please route to Joylene Rahsaan, RN and Joella Prince, RN

## 2021-08-01 ENCOUNTER — Encounter: Payer: Self-pay | Admitting: Nurse Practitioner

## 2021-08-01 ENCOUNTER — Encounter: Payer: Self-pay | Admitting: Gastroenterology

## 2021-08-01 ENCOUNTER — Ambulatory Visit (INDEPENDENT_AMBULATORY_CARE_PROVIDER_SITE_OTHER): Payer: Medicare Other | Admitting: Nurse Practitioner

## 2021-08-01 ENCOUNTER — Other Ambulatory Visit: Payer: Self-pay

## 2021-08-01 VITALS — BP 108/71 | HR 72 | Temp 97.3°F | Ht 70.0 in | Wt 199.2 lb

## 2021-08-01 DIAGNOSIS — M5430 Sciatica, unspecified side: Secondary | ICD-10-CM | POA: Diagnosis not present

## 2021-08-01 MED ORDER — METHYLPREDNISOLONE 4 MG PO TBPK: ORAL_TABLET | ORAL | 0 refills | Status: AC

## 2021-08-01 MED ORDER — TRAMADOL HCL 50 MG PO TABS: 50.0000 mg | ORAL_TABLET | Freq: Four times a day (QID) | ORAL | 0 refills | Status: DC | PRN

## 2021-08-01 NOTE — Patient Instructions (Signed)

## 2021-08-01 NOTE — Progress Notes (Signed)
[place],   [address] Phone:  [phone]   Fax:  [phone]   Established Patient Office Visit  Subjective:  Patient ID: Todd Mendoza, male    DOB: March 06, 1951  Age: 70 y.o. MRN: [id number]  CC:  Chief Complaint  Patient presents with   Flank Pain    Pt is here today for right low back side pain that radiates down his right thigh and leg. Pt was seen for this issue back on 12/20/20.    HPI Todd Mendoza presents for follow up. He  has a past medical history of Hyperlipidemia, Hypertension, Stroke ([place]), and Vertigo.   He is having pain in his hip. He reports that this came on all of a sudden. The pain radiates up to hip. He has sharp pain that comes into the leg. He reports that APAP is not effective. He is taking 4 at at time with no relief. He reports that this comes and goes.   He is taking his wife pain medication;  He was seen by urology. His medication was changed. His has not seen a difference.   Past Medical History:  Diagnosis Date   Hyperlipidemia    Hypertension    Stroke 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[employer]  Tobacco Use   Smoking status: Former    Packs/day: 0.20    Types: Cigarettes   Smokeless tobacco: Never  Vaping Use   Vaping Use: Never used  Substance and Sexual Activity   Alcohol use: No    Alcohol/week: 0.0 standard drinks   Drug use: No   Sexual activity: Yes     Birth control/protection: None  Other Topics Concern   Not on file  Social History Narrative   Married   Psychologist, counselling daily   Social Determinants of Health   Financial Resource Strain: Low Risk    Difficulty of Paying Living Expenses: Not hard at all  Food Insecurity: No Food Insecurity   Worried About Charity fundraiser in the Last Year: Never true   Arboriculturist in the Last Year: Never true  Transportation Needs: No Transportation Needs   Lack of Transportation (Medical): No   Lack of Transportation (Non-Medical): No  Physical Activity: Sufficiently Active   Days of Exercise per Week: 4 days   Minutes of Exercise per Session: 40 min  Stress: Not on file  Social Connections: Moderately Integrated   Frequency of Communication with Friends and Family: Three times a week   Frequency of Social Gatherings with Friends and Family: Three times a week   Attends Religious Services: More than 4 times per year   Active Member of Clubs or Organizations: No   Attends Club or  Organization Meetings: Never   Marital Status: Married  Human resources officer Violence: Not At Risk   Fear of Current or Ex-Partner: No   Emotionally Abused: No   Physically Abused: No   Sexually Abused: No    Outpatient Medications Prior to Visit  Medication Sig Dispense Refill   amLODipine (NORVASC) 5 MG tablet Take 1 tablet (5 mg total) by mouth daily. 90 tablet 3   aspirin EC 81 MG tablet Take 1 tablet (81 mg total) by mouth daily. 30 tablet 0   atorvastatin (LIPITOR) 20 MG tablet Take 1 tablet (20 mg total) by mouth daily. 90 tablet 3   hydrochlorothiazide (HYDRODIURIL) 25 MG tablet Take 1 tablet (25 mg total) by mouth daily. 90 tablet 3   finasteride (PROSCAR) 5 MG tablet Take 1 tablet (5 mg total) by mouth daily. 90 tablet 3   silodosin (RAPAFLO) 8 MG CAPS capsule Take 8 mg by mouth daily. (Patient not taking: Reported on 08/01/2021)     tamsulosin (FLOMAX) 0.4 MG CAPS capsule TAKE 1 CAPSULE BY MOUTH EVERY DAY  (Patient not taking: Reported on 08/01/2021) 30 capsule 0   No facility-administered medications prior to visit.    No Known Allergies  ROS Review of Systems    Objective:    Physical Exam HENT:     Head: Normocephalic.     Nose: Nose normal.     Mouth/Throat:     Mouth: Mucous membranes are moist.  Cardiovascular:     Rate and Rhythm: Normal rate.     Pulses: Normal pulses.     Heart sounds: Normal heart sounds.  Musculoskeletal:        General: No swelling. Normal range of motion.     Cervical back: Normal range of motion.     Right lower leg: No edema.     Left lower leg: No edema.  Skin:    General: Skin is warm and dry.     Capillary Refill: Capillary refill takes less than 2 seconds.  Neurological:     General: No focal deficit present.     Mental Status: He is alert and oriented to person, place, and time.    BP 108/71   Pulse 72   Temp (!) 97.3 F (36.3 C)   Ht $R'5\' 10"'GG$  (1.778 m)   Wt 199 lb 3.2 oz (90.4 kg)   SpO2 98%   BMI 28.58 kg/m  Wt Readings from Last 3 Encounters:  08/01/21 199 lb 3.2 oz (90.4 kg)  07/27/21 204 lb (92.5 kg)  07/12/21 204 lb (92.5 kg)     There are no preventive care reminders to display for this patient.   There are no preventive care reminders to display for this patient.  Lab Results  Component Value Date   TSH 1.500 05/18/2021   Lab Results  Component Value Date   WBC 5.7 05/18/2021   HGB 12.7 (L) 05/18/2021   HCT 38.5 05/18/2021   MCV 87 05/18/2021   PLT 208 05/18/2021   Lab Results  Component Value Date   NA 141 05/18/2021   K 4.4 05/18/2021   CO2 28 10/13/2019   GLUCOSE 66 05/18/2021   BUN 9 05/18/2021   CREATININE 0.92 05/18/2021   BILITOT 0.4 05/18/2021   ALKPHOS 94 05/18/2021   AST 26 05/18/2021   ALT 20 10/13/2019   PROT 7.5 05/18/2021   ALBUMIN 4.4 05/18/2021   CALCIUM 9.7 05/18/2021   EGFR 89 05/18/2021   GFR 92.71 10/13/2019   Lab  Results  Component Value Date   CHOL 169 05/18/2021    Lab Results  Component Value Date   HDL 36 (L) 05/18/2021   Lab Results  Component Value Date   LDLCALC 117 (H) 05/18/2021   Lab Results  Component Value Date   TRIG 83 05/18/2021   Lab Results  Component Value Date   CHOLHDL 4.7 05/18/2021   Lab Results  Component Value Date   HGBA1C 5.4 10/13/2019      Assessment & Plan:   Problem List Items Addressed This Visit   None Visit Diagnoses     Sciatic leg pain    -  Primary Education provide One time week of Tramadol Xray if any additional complaints    Relevant Medications   methylPREDNISolone (MEDROL) 4 MG TBPK tablet   traMADol (ULTRAM) 50 MG tablet       Meds ordered this encounter  Medications   methylPREDNISolone (MEDROL) 4 MG TBPK tablet    Sig: Follow package instructions.    Dispense:  21 tablet    Refill:  0    Do not add to the "Automatic Refill" notification system.    Order Specific Question:   Supervising Provider    Answer:   Tresa Garter [[id number]]   traMADol (ULTRAM) 50 MG tablet    Sig: Take 1 tablet (50 mg total) by mouth every 6 (six) hours as needed for up to 5 days.    Dispense:  20 tablet    Refill:  0    Order Specific Question:   Supervising Provider    Answer:   Tresa Garter [[id number]]     Follow-up: Return in about 6 months (around 01/29/2022).    Vevelyn Francois, NP

## 2021-08-02 ENCOUNTER — Other Ambulatory Visit (HOSPITAL_COMMUNITY): Payer: Self-pay

## 2021-08-04 ENCOUNTER — Encounter: Payer: Self-pay | Admitting: Nurse Practitioner

## 2021-09-15 ENCOUNTER — Other Ambulatory Visit: Payer: Self-pay

## 2021-09-15 ENCOUNTER — Encounter: Payer: Self-pay | Admitting: Nurse Practitioner

## 2021-09-15 ENCOUNTER — Ambulatory Visit (INDEPENDENT_AMBULATORY_CARE_PROVIDER_SITE_OTHER): Payer: Medicare Other | Admitting: Nurse Practitioner

## 2021-09-15 VITALS — BP 120/69 | HR 68 | Temp 98.0°F | Ht 70.0 in | Wt 204.6 lb

## 2021-09-15 DIAGNOSIS — R29898 Other symptoms and signs involving the musculoskeletal system: Secondary | ICD-10-CM | POA: Diagnosis not present

## 2021-09-15 DIAGNOSIS — R202 Paresthesia of skin: Secondary | ICD-10-CM | POA: Diagnosis not present

## 2021-09-15 DIAGNOSIS — M5430 Sciatica, unspecified side: Secondary | ICD-10-CM | POA: Diagnosis not present

## 2021-09-15 DIAGNOSIS — R200 Anesthesia of skin: Secondary | ICD-10-CM

## 2021-09-15 MED ORDER — TRAMADOL HCL 50 MG PO TABS: 50.0000 mg | ORAL_TABLET | Freq: Four times a day (QID) | ORAL | 0 refills | Status: AC | PRN

## 2021-09-15 NOTE — Progress Notes (Signed)
[place], [place]  [address] Phone:  [phone]   Fax:  [phone]   Established Patient Office Visit  Subjective:  Patient ID: Todd Mendoza, male    DOB: 1951/08/25  Age: 70 y.o. MRN: [id number]  CC:  Chief Complaint  Patient presents with   Follow-up   Numbness    Pt is here today to discuss the numbness he has been having in both his legs radiating down to both ankles x 1 month. Pt states at times he has to sit down because his legs feel like they are going to give out.     HPI Todd Mendoza presents for follow up. He  has a past medical history of Hyperlipidemia, Hypertension, Stroke ([place]), and Vertigo.   He is having weakness, numbness and tingling. He has problems with sciatic nerve pain with right leg issues. He reports that this is painful. This is not his current problems. He reports that the N/T/W is in both legs. He denies any injuries. He has taken all of the pain medication; Tramadol. He denies any burning or pain. He is wondering if he could get a refill for the pain medication. He has occasional pain.  He is doing a lot of lifting a work; Engineer, petroleum.    Past Medical History:  Diagnosis Date   Hyperlipidemia    Hypertension    Stroke [facility])    Vertigo     Past Surgical History:  Procedure Laterality Date   COLONOSCOPY     NO PAST SURGERIES      Family History  Problem Relation Age of Onset   Diabetes Father    Colon cancer Neg Hx    Colon polyps Neg Hx    Esophageal cancer Neg Hx    Stomach cancer Neg Hx    Rectal cancer Neg Hx     Social History   Socioeconomic History   Marital status: Married    Spouse name: Not on file   Number of children: Not on file   Years of education: Not on file   Highest education level: Not on file  Occupational History   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70 [employer]  Tobacco Use   Smoking status: Former    Packs/day: 0.20    Types: Cigarettes   Smokeless tobacco: Never   Vaping Use   Vaping Use: Never used  Substance and Sexual Activity   Alcohol use: No    Alcohol/week: 0.0 standard drinks   Drug use: No   Sexual activity: Yes    Birth control/protection: None  Other Topics Concern   Not on file  Social History Narrative   Married   Psychologist, counselling daily   Social Determinants of Health   Financial Resource Strain: Low Risk    Difficulty of Paying Living Expenses: Not hard at all  Food Insecurity: No Food Insecurity   Worried About Charity fundraiser in the Last Year: Never true   Arboriculturist in the Last Year: Never true  Transportation Needs: No Transportation Needs   Lack of Transportation (Medical): No   Lack of Transportation (Non-Medical): No  Physical Activity: Sufficiently Active   Days of Exercise per Week: 4 days   Minutes of Exercise per Session: 40 min  Stress: Not on file  Social Connections: Moderately Integrated   Frequency of Communication with Friends and Family: Three times a week   Frequency of Social Gatherings with Friends and Family: Three times a  week   Attends Religious Services: More than 4 times per year   Active Member of Clubs or Organizations: No   Attends Archivist Meetings: Never   Marital Status: Married  Human resources officer Violence: Not At Risk   Fear of Current or Ex-Partner: No   Emotionally Abused: No   Physically Abused: No   Sexually Abused: No    Outpatient Medications Prior to Visit  Medication Sig Dispense Refill   amLODipine (NORVASC) 5 MG tablet Take 1 tablet (5 mg total) by mouth daily. 90 tablet 3   aspirin EC 81 MG tablet Take 1 tablet (81 mg total) by mouth daily. 30 tablet 0   atorvastatin (LIPITOR) 20 MG tablet Take 1 tablet (20 mg total) by mouth daily. 90 tablet 3   finasteride (PROSCAR) 5 MG tablet Take 5 mg by mouth daily.     hydrochlorothiazide (HYDRODIURIL) 25 MG tablet Take 1 tablet (25 mg total) by mouth daily. 90 tablet 3   silodosin (RAPAFLO) 8 MG CAPS capsule Take 8 mg  by mouth daily. (Patient not taking: Reported on 08/01/2021)     No facility-administered medications prior to visit.    No Known Allergies  ROS Review of Systems    Objective:    Physical Exam HENT:     Head: Normocephalic.     Nose: Nose normal.     Mouth/Throat:     Mouth: Mucous membranes are moist.  Cardiovascular:     Rate and Rhythm: Normal rate.     Pulses: Normal pulses.     Heart sounds: Normal heart sounds.  Musculoskeletal:        General: No swelling. Normal range of motion.     Cervical back: Normal range of motion.     Right lower leg: No edema.     Left lower leg: No edema.  Skin:    General: Skin is warm and dry.     Capillary Refill: Capillary refill takes less than 2 seconds.  Neurological:     General: No focal deficit present.     Mental Status: He is alert and oriented to person, place, and time.    BP 120/69    Pulse 68    Temp 98 F (36.7 C)    Ht _0  (1.778 m)    Wt 204 lb 9.6 oz (92.8 kg)    SpO2 98%    BMI 29.36 kg/m  Wt Readings from Last 3 Encounters:  09/15/21 204 lb 9.6 oz (92.8 kg)  08/01/21 199 lb 3.2 oz (90.4 kg)  07/27/21 204 lb (92.5 kg)     Health Maintenance Due  Topic Date Due   COVID-19 Vaccine (3 - Pfizer risk series) 01/20/2020    There are no preventive care reminders to display for this patient.  Lab Results  Component Value Date   TSH 1.500 05/18/2021   Lab Results  Component Value Date   WBC 5.7 05/18/2021   HGB 12.7 (L) 05/18/2021   HCT 38.5 05/18/2021   MCV 87 05/18/2021   PLT 208 05/18/2021   Lab Results  Component Value Date   NA 141 05/18/2021   K 4.4 05/18/2021   CO2 28 10/13/2019   GLUCOSE 66 05/18/2021   BUN 9 05/18/2021   CREATININE 0.92 05/18/2021   BILITOT 0.4 05/18/2021   ALKPHOS 94 05/18/2021   AST 26 05/18/2021   ALT 20 10/13/2019   PROT 7.5 05/18/2021   ALBUMIN 4.4 05/18/2021   CALCIUM 9.7 05/18/2021  EGFR 89 05/18/2021   GFR 92.71 10/13/2019   Lab Results  Component  Value Date   CHOL 169 05/18/2021   Lab Results  Component Value Date   HDL 36 (L) 05/18/2021   Lab Results  Component Value Date   LDLCALC 117 (H) 05/18/2021   Lab Results  Component Value Date   TRIG 83 05/18/2021   Lab Results  Component Value Date   CHOLHDL 4.7 05/18/2021   Lab Results  Component Value Date   HGBA1C 5.4 10/13/2019      Assessment & Plan:   Problem List Items Addressed This Visit   None Visit Diagnoses     Sciatic leg pain    -  Primary   Relevant Medications   traMADol (ULTRAM) 50 MG tablet   Numbness and tingling of both lower extremities     Education provided Images needed to further evaluate and for additional treatment options Chronic pain medication use will be referred to pain management   Relevant Orders   DG Lumbar Spine Complete   Weakness of both lower extremities       Relevant Orders   DG Lumbar Spine Complete       Meds ordered this encounter  Medications   traMADol (ULTRAM) 50 MG tablet    Sig: Take 1 tablet (50 mg total) by mouth every 6 (six) hours as needed for up to 5 days.    Dispense:  20 tablet    Refill:  0    Order Specific Question:   Supervising Provider    Answer:   Tresa Garter [id number]    Follow-up: Return in about 3 months (around 12/14/2021) for Follow up HTN [id number].    Vevelyn Francois, NP

## 2021-09-15 NOTE — Patient Instructions (Signed)
Radicular Pain Radicular pain is a type of pain that spreads from your back or neck along a spinal nerve. Spinal nerves are nerves that leave the spinal cord and go to the muscles. Radicular pain is sometimes called radiculopathy, radiculitis, or a pinched nerve. When you have this type of pain, you may also have weakness, numbness, or tingling in the area of your body that is supplied by the nerve. The pain may feel sharp and burning. Depending on which spinal nerve is affected, the pain may occur in the: Neck area (cervical radicular pain). You may also feel pain, numbness, weakness, or tingling in the arms. Mid-spine area (thoracic radicular pain). You would feel this pain in the back and chest. This type is rare. Lower back area (lumbar radicular pain). You would feel this pain as low back pain. You may feel pain, numbness, weakness, or tingling in the buttocks or legs. Sciatica is a type of lumbar radicular pain that shoots down the back of the leg. Radicular pain occurs when one of the spinal nerves becomes irritated or squeezed (compressed). It is often caused by something pushing on a spinal nerve, such as one of the bones of the spine (vertebrae) or one of the round cushions between vertebrae (intervertebral disks). This can result from: An injury. Wear and tear or aging of a disk. The growth of a bone spur that pushes on the nerve. Radicular pain often goes away when you follow instructions from your health care provider for relieving pain at home. How is this treated? Treatment may depend on the cause of the condition and may include: Working with a physical therapist. Taking pain medicine. Applying heat or ice or both to the affected areas. Doing stretches to improve flexibility. Having surgery. This may be needed if other treatments do not help. Different types of surgery may be done depending on the cause of this condition. Follow these instructions at home: Managing pain   If  directed, put ice on the affected area. To do this: Put ice in a plastic bag. Place a towel between your skin and the bag. Leave the ice on for 20 minutes, 2-3 times a day. Remove the ice if your skin turns bright red. This is very important. If you cannot feel pain, heat, or cold, you have a greater risk of damage to the area. If directed, apply heat to the affected area as often as told by your health care provider. Use the heat source that your health care provider recommends, such as a moist heat pack or a heating pad. Place a towel between your skin and the heat source. Leave the heat on for 20-30 minutes. Remove the heat if your skin turns bright red. This is especially important if you are unable to feel pain, heat, or cold. You have a greater risk of getting burned. Activity Do not sit or rest in bed for long periods of time. Try to stay as active as possible. Ask your health care provider what type of exercise or activity is best for you. Avoid activities that make your pain worse, such as bending and lifting. You may have to avoid lifting. Ask your health care provider how much you can safely lift. Practice using proper technique when lifting items. Proper lifting technique involves bending your knees and rising up. Do strength and range-of-motion exercises only as told by your health care provider or physical therapist. General instructions Take over-the-counter and prescription medicines only as told by your health care  provider. Pay attention to any changes in your symptoms. Keep all follow-up visits. This is important. Contact a health care provider if: Your pain and other symptoms get worse. Your pain medicine is not helping. Your pain has not improved after a few weeks of home care. You have a fever. Get help right away if: You have severe pain, weakness, or numbness. You have difficulty with bladder or bowel control. Summary Radicular pain is a type of pain that spreads  from your back or neck along a spinal nerve. When you have radicular pain, you may also have weakness, numbness, or tingling in the area of your body that is supplied by the nerve. The pain may feel sharp or burning. Radicular pain may be treated with ice, heat, medicines, or physical therapy. This information is not intended to replace advice given to you by your health care provider. Make sure you discuss any questions you have with your health care provider. Document Revised: 03/17/2021 Document Reviewed: 03/17/2021 Elsevier Patient Education  [place].

## 2021-09-21 ENCOUNTER — Ambulatory Visit: Payer: Medicare Other | Admitting: Gastroenterology

## 2021-09-26 DIAGNOSIS — H33311 Horseshoe tear of retina without detachment, right eye: Secondary | ICD-10-CM | POA: Diagnosis not present

## 2021-09-26 DIAGNOSIS — H1045 Other chronic allergic conjunctivitis: Secondary | ICD-10-CM | POA: Diagnosis not present

## 2021-09-26 DIAGNOSIS — H40013 Open angle with borderline findings, low risk, bilateral: Secondary | ICD-10-CM | POA: Diagnosis not present

## 2021-09-26 DIAGNOSIS — H25813 Combined forms of age-related cataract, bilateral: Secondary | ICD-10-CM | POA: Diagnosis not present

## 2021-09-27 DIAGNOSIS — H33321 Round hole, right eye: Secondary | ICD-10-CM | POA: Diagnosis not present

## 2021-09-27 DIAGNOSIS — H31091 Other chorioretinal scars, right eye: Secondary | ICD-10-CM | POA: Diagnosis not present

## 2021-09-27 DIAGNOSIS — H25811 Combined forms of age-related cataract, right eye: Secondary | ICD-10-CM | POA: Diagnosis not present

## 2021-09-27 DIAGNOSIS — H43391 Other vitreous opacities, right eye: Secondary | ICD-10-CM | POA: Diagnosis not present

## 2021-09-27 DIAGNOSIS — H33311 Horseshoe tear of retina without detachment, right eye: Secondary | ICD-10-CM | POA: Diagnosis not present

## 2021-09-29 DIAGNOSIS — H33311 Horseshoe tear of retina without detachment, right eye: Secondary | ICD-10-CM | POA: Diagnosis not present

## 2021-10-03 DIAGNOSIS — H33311 Horseshoe tear of retina without detachment, right eye: Secondary | ICD-10-CM | POA: Diagnosis not present

## 2021-10-03 DIAGNOSIS — H25813 Combined forms of age-related cataract, bilateral: Secondary | ICD-10-CM | POA: Diagnosis not present

## 2021-10-03 DIAGNOSIS — H40013 Open angle with borderline findings, low risk, bilateral: Secondary | ICD-10-CM | POA: Diagnosis not present

## 2021-10-07 DIAGNOSIS — H25811 Combined forms of age-related cataract, right eye: Secondary | ICD-10-CM | POA: Diagnosis not present

## 2021-10-20 DIAGNOSIS — H33321 Round hole, right eye: Secondary | ICD-10-CM | POA: Diagnosis not present

## 2021-10-20 DIAGNOSIS — H33311 Horseshoe tear of retina without detachment, right eye: Secondary | ICD-10-CM | POA: Diagnosis not present

## 2021-10-20 DIAGNOSIS — H31091 Other chorioretinal scars, right eye: Secondary | ICD-10-CM | POA: Diagnosis not present

## 2021-10-20 DIAGNOSIS — H43391 Other vitreous opacities, right eye: Secondary | ICD-10-CM | POA: Diagnosis not present

## 2021-11-07 DIAGNOSIS — H33311 Horseshoe tear of retina without detachment, right eye: Secondary | ICD-10-CM | POA: Diagnosis not present

## 2021-12-09 ENCOUNTER — Other Ambulatory Visit: Payer: Self-pay | Admitting: Nurse Practitioner

## 2021-12-15 ENCOUNTER — Ambulatory Visit: Payer: Medicare Other | Admitting: Nurse Practitioner

## 2022-01-22 ENCOUNTER — Other Ambulatory Visit: Payer: Self-pay | Admitting: Nurse Practitioner

## 2022-01-22 DIAGNOSIS — I10 Essential (primary) hypertension: Secondary | ICD-10-CM

## 2022-03-08 ENCOUNTER — Other Ambulatory Visit: Payer: Self-pay | Admitting: Nurse Practitioner

## 2022-03-24 ENCOUNTER — Other Ambulatory Visit: Payer: Self-pay | Admitting: Nurse Practitioner

## 2022-04-03 ENCOUNTER — Encounter: Payer: Self-pay | Admitting: Internal Medicine

## 2022-04-03 ENCOUNTER — Ambulatory Visit: Payer: Medicare Other | Attending: Internal Medicine | Admitting: Internal Medicine

## 2022-04-03 VITALS — BP 150/92 | HR 71 | Temp 98.3°F | Resp 16 | Wt 208.0 lb

## 2022-04-03 DIAGNOSIS — D649 Anemia, unspecified: Secondary | ICD-10-CM | POA: Diagnosis not present

## 2022-04-03 DIAGNOSIS — Z7689 Persons encountering health services in other specified circumstances: Secondary | ICD-10-CM

## 2022-04-03 DIAGNOSIS — E782 Mixed hyperlipidemia: Secondary | ICD-10-CM

## 2022-04-03 DIAGNOSIS — I10 Essential (primary) hypertension: Secondary | ICD-10-CM

## 2022-04-03 DIAGNOSIS — L720 Epidermal cyst: Secondary | ICD-10-CM | POA: Diagnosis not present

## 2022-04-03 DIAGNOSIS — Z8619 Personal history of other infectious and parasitic diseases: Secondary | ICD-10-CM

## 2022-04-03 DIAGNOSIS — E663 Overweight: Secondary | ICD-10-CM

## 2022-04-03 NOTE — Patient Instructions (Signed)
Check your blood pressure twice a week with goal being 130/80 or lower.  Healthy Eating Following a healthy eating pattern may help you to achieve and maintain a healthy body weight, reduce the risk of chronic disease, and live a long and productive life. It is important to follow a healthy eating pattern at an appropriate calorie level for your body. Your nutritional needs should be met primarily through food by choosing a variety of nutrient-rich foods. What are tips for following this plan? Reading food labels Read labels and choose the following: Reduced or low sodium. Juices with 100% fruit juice. Foods with low saturated fats and high polyunsaturated and monounsaturated fats. Foods with whole grains, such as whole wheat, cracked wheat, brown rice, and wild rice. Whole grains that are fortified with folic acid. This is recommended for women who are pregnant or who want to become pregnant. Read labels and avoid the following: Foods with a lot of added sugars. These include foods that contain brown sugar, corn sweetener, corn syrup, dextrose, fructose, glucose, high-fructose corn syrup, honey, invert sugar, lactose, malt syrup, maltose, molasses, raw sugar, sucrose, trehalose, or turbinado sugar. Do not eat more than the following amounts of added sugar per day: 6 teaspoons (25 g) for women. 9 teaspoons (38 g) for men. Foods that contain processed or refined starches and grains. Refined grain products, such as white flour, degermed cornmeal, white bread, and white rice. Shopping Choose nutrient-rich snacks, such as vegetables, whole fruits, and nuts. Avoid high-calorie and high-sugar snacks, such as potato chips, fruit snacks, and candy. Use oil-based dressings and spreads on foods instead of solid fats such as butter, stick margarine, or cream cheese. Limit pre-made sauces, mixes, and "instant" products such as flavored rice, instant noodles, and ready-made pasta. Try more plant-protein  sources, such as tofu, tempeh, black beans, edamame, lentils, nuts, and seeds. Explore eating plans such as the Mediterranean diet or vegetarian diet. Cooking Use oil to saut or stir-fry foods instead of solid fats such as butter, stick margarine, or lard. Try baking, boiling, grilling, or broiling instead of frying. Remove the fatty part of meats before cooking. Steam vegetables in water or broth. Meal planning  At meals, imagine dividing your plate into fourths: One-half of your plate is fruits and vegetables. One-fourth of your plate is whole grains. One-fourth of your plate is protein, especially lean meats, poultry, eggs, tofu, beans, or nuts. Include low-fat dairy as part of your daily diet. Lifestyle Choose healthy options in all settings, including home, work, school, restaurants, or stores. Prepare your food safely: Wash your hands after handling raw meats. Keep food preparation surfaces clean by regularly washing with hot, soapy water. Keep raw meats separate from ready-to-eat foods, such as fruits and vegetables. Cook seafood, meat, poultry, and eggs to the recommended internal temperature. Store foods at safe temperatures. In general: Keep cold foods at 76F (4.4C) or below. Keep hot foods at 176F (60C) or above. Keep your freezer at [facility] (-17.8C) or below. Foods are no longer safe to eat when they have been between the temperatures of 40-176F (4.4-60C) for more than 2 hours. What foods should I eat? Fruits Aim to eat 2 cup-equivalents of fresh, canned (in natural juice), or frozen fruits each day. Examples of 1 cup-equivalent of fruit include 1 small apple, 8 large strawberries, 1 cup canned fruit,  cup dried fruit, or 1 cup 100% juice. Vegetables Aim to eat 2-3 cup-equivalents of fresh and frozen vegetables each day, including different varieties and  colors. Examples of 1 cup-equivalent of vegetables include 2 medium carrots, 2 cups raw, leafy greens, 1 cup  chopped vegetable (raw or cooked), or 1 medium baked potato. Grains Aim to eat 6 ounce-equivalents of whole grains each day. Examples of 1 ounce-equivalent of grains include 1 slice of bread, 1 cup ready-to-eat cereal, 3 cups popcorn, or  cup cooked rice, pasta, or cereal. Meats and other proteins Aim to eat 5-6 ounce-equivalents of protein each day. Examples of 1 ounce-equivalent of protein include 1 egg, 1/2 cup nuts or seeds, or 1 tablespoon (16 g) peanut butter. A cut of meat or fish that is the size of a deck of cards is about 3-4 ounce-equivalents. Of the protein you eat each week, try to have at least 8 ounces come from seafood. This includes salmon, trout, herring, and anchovies. Dairy Aim to eat 3 cup-equivalents of fat-free or low-fat dairy each day. Examples of 1 cup-equivalent of dairy include 1 cup (240 mL) milk, 8 ounces (250 g) yogurt, 1 ounces (44 g) natural cheese, or 1 cup (240 mL) fortified soy milk. Fats and oils Aim for about 5 teaspoons (21 g) per day. Choose monounsaturated fats, such as canola and olive oils, avocados, peanut butter, and most nuts, or polyunsaturated fats, such as sunflower, corn, and soybean oils, walnuts, pine nuts, sesame seeds, sunflower seeds, and flaxseed. Beverages Aim for six 8-oz glasses of water per day. Limit coffee to three to five 8-oz cups per day. Limit caffeinated beverages that have added calories, such as soda and energy drinks. Limit alcohol intake to no more than 1 drink a day for nonpregnant women and 2 drinks a day for men. One drink equals 12 oz of beer (355 mL), 5 oz of wine (148 mL), or 1 oz of hard liquor (44 mL). Seasoning and other foods Avoid adding excess amounts of salt to your foods. Try flavoring foods with herbs and spices instead of salt. Avoid adding sugar to foods. Try using oil-based dressings, sauces, and spreads instead of solid fats. This information is based on general [place] nutrition guidelines. For more  information, visit [employer].es. Exact amounts may vary based on your nutrition needs. Summary A healthy eating plan may help you to maintain a healthy weight, reduce the risk of chronic diseases, and stay active throughout your life. Plan your meals. Make sure you eat the right portions of a variety of nutrient-rich foods. Try baking, boiling, grilling, or broiling instead of frying. Choose healthy options in all settings, including home, work, school, restaurants, or stores. This information is not intended to replace advice given to you by your health care provider. Make sure you discuss any questions you have with your health care provider. Document Revised: 05/10/2021 Document Reviewed: 05/10/2021 Elsevier Patient Education  [place].

## 2022-04-03 NOTE — Progress Notes (Signed)
Patient ID: Todd Mendoza, male    DOB: 21-Mar-1951  MRN: [id number]  CC: weight concerns and Establish Care   Subjective: Todd Mendoza is a 71 y.o. male who presents for new pt visit His concerns today include:  Pt with hx of HTN, ED, BPH, CVA, HL OA RT shoulder, lumbar radiculopathy, Hx of hep C treated in [facility].   Previous PCP was NP Dionisio David at [facility].  She has left.  HTN:  on Norvasc 5 mg and HCTZ 25 mg.  Did not take meds as yet for today and admits that he does not take the meds every day.  Forgets to take them about once a wk.  Does have a pill box that he uses He has cut back on salt in the foods HL-taking Lipitor as prescribed.   Concern about his wgh. Gained 9 lbs since 07/2021. Admits he eats a lot of sweets at nights after dinner-cake, ice cream, cookies.  Reports he is conscious of what he is doing and knows it is not healthy for him to snack on these things. Drinks Fruit Punch, and sodas.   He walks 1-3 days a wk.    Reports intermittent flare of sciatica Usually on RT side. Starts lateral hip and radiates down to about mid thigh. Notice tingling but no numbness.  Flared up this past wk-end.  Did not do anything stranous.  Takes Advil but did not take any this past weekend.    BPH:  on Proscar and Flomax.  Slow to get stream started but does not have to strain.  Wakes up only once at nights to urinate.  Saw urologist one time. Does not recall the name of the specialist he saw.   Cyst on his back x few yrs. had it lanced before but it subsequently came back.  He would like to have it removed.  Patient with history of hepatitis C positive antibody on his chart/problem list.  He tells me that he thinks he had hepatitis C and was treated at a clinic in [facility] several years ago.  However he does not recall the treatment lasting 2 to 3 months.  Patient Active Problem List   Diagnosis Date Noted   Chronic right shoulder pain 10/04/2020   Frequent  headaches 10/13/2019   Hyperglycemia 10/11/2019   Hepatitis C antibody test positive 06/26/2019   DOE (dyspnea on exertion) 02/13/2019   Overweight (BMI 25.0-29.9) 02/13/2019   Anemia 04/30/2018   ED (erectile dysfunction) 11/03/2016   Lumbar radiculopathy 06/30/2016   Family history of diabetes mellitus 03/14/2016   Encounter for prostate cancer screening 03/14/2016   H/O: CVA (cerebrovascular accident) 12/26/2012   HTN (hypertension) 12/26/2012   HLD (hyperlipidemia) 12/26/2012   Allergic rhinitis 12/26/2012     Current Outpatient Medications on File Prior to Visit  Medication Sig Dispense Refill   amLODipine (NORVASC) 5 MG tablet Take 1 tablet (5 mg total) by mouth daily. 90 tablet 3   aspirin EC 81 MG tablet Take 1 tablet (81 mg total) by mouth daily. 30 tablet 0   atorvastatin (LIPITOR) 20 MG tablet TAKE 1 TABLET BY MOUTH EVERY DAY 90 tablet 0   finasteride (PROSCAR) 5 MG tablet Take 5 mg by mouth daily.     hydrochlorothiazide (HYDRODIURIL) 25 MG tablet TAKE 1 TABLET (25 MG TOTAL) BY MOUTH DAILY. 90 tablet 3   silodosin (RAPAFLO) 8 MG CAPS capsule Take 8 mg by mouth daily.     No  current facility-administered medications on file prior to visit.    No Known Allergies  Social History   Socioeconomic History   Marital status: Married    Spouse name: Not on file   Number of children: Not on file   Years of education: Not on file   Highest education level: Not on file  Occupational History   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[employer]  Tobacco Use   Smoking status: Former    Packs/day: 0.20    Types: Cigarettes   Smokeless tobacco: Never  Vaping Use   Vaping Use: Never used  Substance and Sexual Activity   Alcohol use: No    Alcohol/week: 0.0 standard drinks of alcohol   Drug use: No   Sexual activity: Yes    Birth control/protection: None  Other Topics Concern   Not on file  Social History Narrative   Married   Psychologist, counselling daily   Social Determinants of Health   Financial  Resource Strain: Low Risk  (06/19/2021)   Overall Financial Resource Strain (CARDIA)    Difficulty of Paying Living Expenses: Not hard at all  Food Insecurity: No Food Insecurity (06/19/2021)   Hunger Vital Sign    Worried About Running Out of Food in the Last Year: Never true    [place] in the Last Year: Never true  Transportation Needs: No Transportation Needs (06/19/2021)   PRAPARE - Hydrologist (Medical): No    Lack of Transportation (Non-Medical): No  Physical Activity: Sufficiently Active (06/19/2021)   Exercise Vital Sign    Days of Exercise per Week: 4 days    Minutes of Exercise per Session: 40 min  Stress: No Stress Concern Present (03/25/2020)   [place]    Feeling of Stress : Not at all  Social Connections: Moderately Integrated (06/19/2021)   Social Connection and Isolation Panel [NHANES]    Frequency of Communication with Friends and Family: Three times a week    Frequency of Social Gatherings with Friends and Family: Three times a week    Attends Religious Services: More than 4 times per year    Active Member of Clubs or Organizations: No    Attends Archivist Meetings: Never    Marital Status: Married  Human resources officer Violence: Not At Risk (06/19/2021)   Humiliation, Afraid, Rape, and Kick questionnaire    Fear of Current or Ex-Partner: No    Emotionally Abused: No    Physically Abused: No    Sexually Abused: No    Family History  Problem Relation Age of Onset   Diabetes Father    Colon cancer Neg Hx    Colon polyps Neg Hx    Esophageal cancer Neg Hx    Stomach cancer Neg Hx    Rectal cancer Neg Hx     Past Surgical History:  Procedure Laterality Date   COLONOSCOPY     NO PAST SURGERIES      ROS: Review of Systems Negative except as stated above  PHYSICAL EXAM: BP (!) 150/92 (BP Location: Left Arm, Patient Position: Sitting, Cuff Size:  Large)   Pulse 71   Temp 98.3 F (36.8 C) (Oral)   Resp 16   Wt 208 lb (94.3 kg)   SpO2 97%   BMI 29.84 kg/m   Wt Readings from Last 3 Encounters:  04/03/22 208 lb (94.3 kg)  09/15/21 204 lb 9.6 oz (92.8 kg)  08/01/21 199 lb  3.2 oz (90.4 kg)  BP 163/74.  Physical Exam   General appearance - alert, well appearing, elderly African-American male and in no distress Mental status - normal mood, behavior, speech, dress, motor activity, and thought processes Neck - supple, no significant adenopathy Chest - clear to auscultation, no wheezes, rales or rhonchi, symmetric air entry Heart - normal rate, regular rhythm, normal S1, S2, no murmurs, rubs, clicks or gallops Musculoskeletal -tenderness on palpation of the lumbar spine.  Straight leg raise negative on both sides.  Good range of motion of the right hip.  No tenderness on palpation over the right trochanteric bursa. Extremities - peripheral pulses normal, no pedal edema, no clubbing or cyanosis Skin: Patient has a raised 4 x 4 centimeter soft cyst with a blackhead on the upper posterior thorax between the shoulder blades more so on the left side.  It is not tender to touch.    Latest Ref Rng & Units 05/18/2021    9:57 AM 11/08/2020    9:25 AM 10/13/2019    8:10 AM  CMP  Glucose 65 - 99 mg/dL 66  85  79   BUN 8 - 27 mg/dL '9  10  13   '$ Creatinine 0.76 - 1.27 mg/dL 0.92  1.00  0.97   Sodium 134 - 144 mmol/L 141  139  137   Potassium 3.5 - 5.2 mmol/L 4.4  4.5  3.5   Chloride 96 - 106 mmol/L 102  98  101   CO2 19 - 32 mEq/L   28   Calcium 8.6 - 10.2 mg/dL 9.7  9.6  9.8   Total Protein 6.0 - 8.5 g/dL 7.5  7.4  8.2   Total Bilirubin 0.0 - 1.2 mg/dL 0.4  0.5  0.5   Alkaline Phos 44 - 121 IU/L 94  87  91   AST 0 - 40 IU/L 26  32  28   ALT 0 - 53 U/L   20    Lipid Panel     Component Value Date/Time   CHOL 169 05/18/2021 0957   TRIG 83 05/18/2021 0957   HDL 36 (L) 05/18/2021 0957   CHOLHDL 4.7 05/18/2021 0957   CHOLHDL 4 10/13/2019  0810   VLDL 21.8 10/13/2019 0810   LDLCALC 117 (H) 05/18/2021 0957   LDLDIRECT 62.0 10/09/2018 1324    CBC    Component Value Date/Time   WBC 5.7 05/18/2021 0957   WBC 5.9 02/13/2019 0816   RBC 4.42 05/18/2021 0957   RBC 4.51 02/13/2019 0816   HGB 12.7 (L) 05/18/2021 0957   HCT 38.5 05/18/2021 0957   PLT 208 05/18/2021 0957   MCV 87 05/18/2021 0957   MCH 28.7 05/18/2021 0957   MCH 28.4 12/16/2012 0906   MCHC 33.0 05/18/2021 0957   MCHC 32.6 02/13/2019 0816   RDW 11.8 05/18/2021 0957   LYMPHSABS 2.6 05/18/2021 0957   MONOABS 0.6 02/13/2019 0816   EOSABS 0.4 05/18/2021 0957   BASOSABS 0.0 05/18/2021 0957    ASSESSMENT AND PLAN:  1. Encounter to establish care   2. Essential hypertension Not at goal.  Patient has not taken the amlodipine 5 mg or hydrochlorothiazide 25 mg as yet today.  Encouraged him to take the medications when he returns home. Advised keeping his pillbox on the table where he eats breakfast in the mornings so that he remembers to take his medications with breakfast every morning. DASH diet discussed and encouraged. - CBC - Comprehensive metabolic panel  3. Overweight (  BMI 25.0-29.9) Patient advised to eliminate sugary drinks from the diet, cut back on portion sizes especially of white carbohydrates, eat more white lean meat like chicken [place] and seafood instead of beef or pork and incorporate fresh fruits and vegetables into the diet daily. Encouraged him to get in some moderate intensity exercise 3 to 5 days a week for 30 minutes.  4. Epidermoid cyst of skin of back - Ambulatory referral to Dermatology  5. History of hepatitis C We will check for SVR - HCV RNA quant; Future  6. Mixed hyperlipidemia Continue atorvastatin.  We will check lipid profile today. - Lipid panel  Addendum:  pt with normocytic anemia on labs.  Will check iron studies  Patient was given the opportunity to ask questions.  Patient verbalized understanding of the plan and  was able to repeat key elements of the plan.   This documentation was completed using Radio producer.  Any transcriptional errors are unintentional.  Orders Placed This Encounter  Procedures   HCV RNA quant   CBC   Comprehensive metabolic panel   Lipid panel   Ambulatory referral to Dermatology     Requested Prescriptions    No prescriptions requested or ordered in this encounter    Return for Appt with [facility] in 4 wks for BP check.  Give f/u with me after 06/19/22 for Medicare Wellness.  Karle Plumber, MD, FACP

## 2022-04-03 NOTE — Progress Notes (Signed)
Has weight concerns Intermittent back pain. 10/10 when it comes

## 2022-04-04 LAB — COMPREHENSIVE METABOLIC PANEL
ALT: 17 IU/L (ref 0–44)
AST: 24 IU/L (ref 0–40)
Albumin/Globulin Ratio: 1.5 (ref 1.2–2.2)
Albumin: 4.5 g/dL (ref 3.8–4.8)
Alkaline Phosphatase: 80 IU/L (ref 44–121)
BUN/Creatinine Ratio: 10 (ref 10–24)
BUN: 10 mg/dL (ref 8–27)
Bilirubin Total: 0.4 mg/dL (ref 0.0–1.2)
CO2: 27 mmol/L (ref 20–29)
Calcium: 9.6 mg/dL (ref 8.6–10.2)
Chloride: 102 mmol/L (ref 96–106)
Creatinine, Ser: 0.98 mg/dL (ref 0.76–1.27)
Globulin, Total: 3.1 g/dL (ref 1.5–4.5)
Glucose: 83 mg/dL (ref 70–99)
Potassium: 4.6 mmol/L (ref 3.5–5.2)
Sodium: 145 mmol/L — ABNORMAL HIGH (ref 134–144)
Total Protein: 7.6 g/dL (ref 6.0–8.5)
eGFR: 82 mL/min/{1.73_m2} (ref >59)

## 2022-04-04 LAB — CBC
Hematocrit: 37.1 % — ABNORMAL LOW (ref 37.5–51.0)
Hemoglobin: 11.9 g/dL — ABNORMAL LOW (ref 13.0–17.7)
MCH: 28.1 pg (ref 26.6–33.0)
MCHC: 32.1 g/dL (ref 31.5–35.7)
MCV: 88 fL (ref 79–97)
Platelets: 205 10*3/uL (ref 150–450)
RBC: 4.24 x10E6/uL (ref 4.14–5.80)
RDW: 12.5 % (ref 11.6–15.4)
WBC: 6.2 10*3/uL (ref 3.4–10.8)

## 2022-04-04 LAB — LIPID PANEL
Chol/HDL Ratio: 6.6 ratio — ABNORMAL HIGH (ref 0.0–5.0)
Cholesterol, Total: 198 mg/dL (ref 100–199)
HDL: 30 mg/dL — ABNORMAL LOW (ref >39)
LDL Chol Calc (NIH): 114 mg/dL — ABNORMAL HIGH (ref 0–99)
Triglycerides: 307 mg/dL — ABNORMAL HIGH (ref 0–149)
VLDL Cholesterol Cal: 54 mg/dL — ABNORMAL HIGH (ref 5–40)

## 2022-04-04 NOTE — Addendum Note (Signed)
Addended by: Karle Plumber B on: 04/04/2022 05:47 AM   Modules accepted: Orders

## 2022-04-05 LAB — IRON,TIBC AND FERRITIN PANEL
Ferritin: 25 ng/mL — ABNORMAL LOW (ref 30–400)
Iron Saturation: 32 % (ref 15–55)
Iron: 105 ug/dL (ref 38–169)
Total Iron Binding Capacity: 324 ug/dL (ref 250–450)
UIBC: 219 ug/dL (ref 111–343)

## 2022-04-05 LAB — SPECIMEN STATUS REPORT

## 2022-04-06 ENCOUNTER — Other Ambulatory Visit: Payer: Self-pay | Admitting: Nurse Practitioner

## 2022-06-30 ENCOUNTER — Ambulatory Visit: Payer: Self-pay

## 2022-06-30 DIAGNOSIS — M79605 Pain in left leg: Secondary | ICD-10-CM | POA: Diagnosis not present

## 2022-06-30 DIAGNOSIS — I10 Essential (primary) hypertension: Secondary | ICD-10-CM | POA: Diagnosis not present

## 2022-06-30 DIAGNOSIS — M79604 Pain in right leg: Secondary | ICD-10-CM | POA: Diagnosis not present

## 2022-06-30 DIAGNOSIS — E7800 Pure hypercholesterolemia, unspecified: Secondary | ICD-10-CM | POA: Diagnosis not present

## 2022-06-30 DIAGNOSIS — Z79899 Other long term (current) drug therapy: Secondary | ICD-10-CM | POA: Diagnosis not present

## 2022-06-30 DIAGNOSIS — Z131 Encounter for screening for diabetes mellitus: Secondary | ICD-10-CM | POA: Diagnosis not present

## 2022-06-30 DIAGNOSIS — R5383 Other fatigue: Secondary | ICD-10-CM | POA: Diagnosis not present

## 2022-06-30 NOTE — Telephone Encounter (Signed)
Pt's wife called back and provided an update: Summary: pins and needle  feeling in legs   Pt called back to follow up with the nurse, he saw Magdalene Patricia at [facility]. They drew blood and he will return next Saturday to follow up there

## 2022-06-30 NOTE — Telephone Encounter (Signed)
Summary: pins and needle  feeling in legs   Wife calling to ask for appt for the pt.  He is at work.  But he is having pins and needles feeling in both legs when he walks. This happens often.  She would like to speak w/ a nurse and advise about getting him in for appt w/ his dr. She is on the dpr.      Chief Complaint: pins and needles to both legs, Symptoms: dizziness Frequency: Tuesday or Wednesday Pertinent Negatives: Patient denies (pt at work) Disposition: '[]'$ ED /'[x]'$ Urgent Care (no appt availability in office) / '[]'$ Appointment(In office/virtual)/ '[]'$  [facility]/ '[]'$ Home Care/ '[]'$ Refused Recommended Disposition /'[]'$ [facility]/ '[]'$  Follow-up with PCP Additional Notes:  n/a  Reason for Disposition  [1] Numbness or tingling on both sides of body AND [2] is a new symptom present > 24 hours    Bilateral legs only  Answer Assessment - Initial Assessment Questions 1. SYMPTOM: "What is the main symptom you are concerned about?" (e.g., weakness, numbness)     Pins and needle  2. ONSET: "When did this start?" (minutes, hours, days; while sleeping)     Tuesday or Wed 3. LAST NORMAL: "When was the last time you (the patient) were normal (no symptoms)?"     Monday 4. PATTERN "Does this come and go, or has it been constant since it started?"  "Is it present now?"     Comes and goes -  5. CARDIAC SYMPTOMS: "Have you had any of the following symptoms: chest pain, difficulty breathing, palpitations?"     *No Answer* 6. NEUROLOGIC SYMPTOMS: "Have you had any of the following symptoms: headache, dizziness, vision loss, double vision, changes in speech, unsteady on your feet?"     Dizzy lightheaded 7. OTHER SYMPTOMS: "Do you have any other symptoms?"     *No Answer* 8. PREGNANCY: "Is there any chance you are pregnant?" "When was your last menstrual period?"     N/a  Protocols used: Neurologic Deficit-A-AH

## 2022-07-07 ENCOUNTER — Ambulatory Visit (INDEPENDENT_AMBULATORY_CARE_PROVIDER_SITE_OTHER): Payer: Medicare Other | Admitting: Family

## 2022-07-07 ENCOUNTER — Encounter: Payer: Self-pay | Admitting: Family

## 2022-07-07 VITALS — BP 133/86 | HR 73 | Temp 97.3°F | Ht 70.0 in | Wt 208.8 lb

## 2022-07-07 DIAGNOSIS — L602 Onychogryphosis: Secondary | ICD-10-CM

## 2022-07-07 DIAGNOSIS — Z7689 Persons encountering health services in other specified circumstances: Secondary | ICD-10-CM

## 2022-07-07 DIAGNOSIS — D649 Anemia, unspecified: Secondary | ICD-10-CM | POA: Diagnosis not present

## 2022-07-07 DIAGNOSIS — Z87891 Personal history of nicotine dependence: Secondary | ICD-10-CM

## 2022-07-07 DIAGNOSIS — N400 Enlarged prostate without lower urinary tract symptoms: Secondary | ICD-10-CM

## 2022-07-07 DIAGNOSIS — M5430 Sciatica, unspecified side: Secondary | ICD-10-CM

## 2022-07-07 DIAGNOSIS — M549 Dorsalgia, unspecified: Secondary | ICD-10-CM | POA: Diagnosis not present

## 2022-07-07 DIAGNOSIS — E782 Mixed hyperlipidemia: Secondary | ICD-10-CM

## 2022-07-07 DIAGNOSIS — G8929 Other chronic pain: Secondary | ICD-10-CM | POA: Diagnosis not present

## 2022-07-07 DIAGNOSIS — R200 Anesthesia of skin: Secondary | ICD-10-CM

## 2022-07-07 DIAGNOSIS — R202 Paresthesia of skin: Secondary | ICD-10-CM | POA: Diagnosis not present

## 2022-07-07 DIAGNOSIS — I10 Essential (primary) hypertension: Secondary | ICD-10-CM

## 2022-07-07 MED ORDER — MULTI-VITAMIN/MINERALS PO TABS: 1.0000 | ORAL_TABLET | Freq: Every day | ORAL | 5 refills | Status: AC

## 2022-07-07 NOTE — Progress Notes (Signed)
Provider: Marlowe Sax FNP-C   Giselle Brutus, Nelda Bucks, NP  Patient Care Team: Loretha Ure, Nelda Bucks, NP as PCP - General (Family Medicine)  Extended Emergency Contact Information Primary Emergency Contact: [facility] Address: [address]          [place], [place] [address] Johnnette Litter of [place] Work Phone: [phone] Mobile Phone: [phone] Relation: Spouse  Code Status:  Full Code  Goals of care: Advanced Directive information    07/07/2022    1:04 PM  Advanced Directives  Does Patient Have a Medical Advance Directive? No  Would patient like information on creating a medical advance directive? Yes (Inpatient - patient defers creating a medical advance directive at this time - Information given)     Chief Complaint  Patient presents with   Establish Care    New Patient.     HPI:  Pt is a 71 y.o. male seen today establish care here at [facility] and Adult  care for medical management of chronic diseases  About 6 months ago both legs went numb and tingling and feels like he is going to fall.symptoms goes on and off.He was seen in the Urgent Care in [place].Had blood work done.symptoms still on and off. Currently complains of right foot numbness.   Hypertension - readings  at home 130's/80's .sometimes has some dizziness.denies any headache,vision changes,fatigue,chest tightness,palpitation,chest pain or shortness of breath.   BPH - on finasteride and silodosin asymptomatic.   Hyperlipidemia - on atorvastatin does not exercise due to shortness of breath with exertion.   Former smoker cigarette - smoked 1 and a half pack per day quit smoking 15 yrs ago.  Also used cocaine and marijuana in the past but none currently. Used to drink alcohol but quit.   Due for Influenza and COVID-19 vaccine but states had COVID-19 vaccine 2 weeks ago.     Past Medical History:  Diagnosis Date   Hyperlipidemia    Hypertension    Stroke [facility])    Vertigo    Past  Surgical History:  Procedure Laterality Date   COLONOSCOPY     NO PAST SURGERIES      No Known Allergies  Allergies as of 07/07/2022   No Known Allergies      Medication List        Accurate as of July 07, 2022  1:26 PM. If you have any questions, ask your nurse or doctor.          amLODipine 5 MG tablet Commonly known as: NORVASC TAKE 1 TABLET (5 MG TOTAL) BY MOUTH DAILY.   aspirin EC 81 MG tablet Take 1 tablet (81 mg total) by mouth daily.   atorvastatin 20 MG tablet Commonly known as: LIPITOR TAKE 1 TABLET BY MOUTH EVERY DAY   finasteride 5 MG tablet Commonly known as: PROSCAR TAKE 1 TABLET (5 MG TOTAL) BY MOUTH DAILY.   hydrochlorothiazide 25 MG tablet Commonly known as: HYDRODIURIL TAKE 1 TABLET (25 MG TOTAL) BY MOUTH DAILY.   silodosin 8 MG Caps capsule Commonly known as: RAPAFLO Take 8 mg by mouth daily.        Review of Systems  Constitutional:  Negative for appetite change, chills, fatigue, fever and unexpected weight change.  HENT:  Negative for congestion, dental problem, ear discharge, ear pain, facial swelling, hearing loss, nosebleeds, postnasal drip, rhinorrhea, sinus pressure, sinus pain, sneezing, sore throat, tinnitus and trouble swallowing.   Eyes:  Negative for pain, discharge, redness, itching and visual disturbance.  Respiratory:  Negative for cough, chest tightness, shortness of breath and wheezing.   Cardiovascular:  Negative for chest pain, palpitations and leg swelling.  Gastrointestinal:  Negative for abdominal distention, abdominal pain, blood in stool, constipation, diarrhea, nausea and vomiting.  Endocrine: Negative for cold intolerance, heat intolerance, polydipsia, polyphagia and polyuria.  Genitourinary:  Negative for difficulty urinating, dysuria, flank pain, frequency and urgency.  Musculoskeletal:  Positive for back pain. Negative for arthralgias, gait problem, joint swelling, myalgias, neck pain and neck stiffness.   Skin:  Negative for color change, pallor, rash and wound.  Neurological:  Positive for numbness. Negative for dizziness, syncope, speech difficulty, weakness, light-headedness and headaches.       Chronic numbness and tingling on the legs   Hematological:  Does not bruise/bleed easily.  Psychiatric/Behavioral:  Negative for agitation, behavioral problems, confusion, hallucinations, self-injury, sleep disturbance and suicidal ideas. The patient is not nervous/anxious.     Immunization History  Administered Date(s) Administered   Fluad Quad(high Dose 65+) 06/24/2019, 06/21/2020   Influenza, High Dose Seasonal PF 09/17/2018   Influenza-Unspecified 06/01/2021   PFIZER(Purple Top)SARS-COV-2 Vaccination 11/24/2019, 12/23/2019   Pneumococcal Conjugate-13 04/11/2016   Pneumococcal Polysaccharide-23 05/15/2017   Tdap 04/02/2017   Zoster Recombinat (Shingrix) 10/18/2020, 12/20/2020   Pertinent  Health Maintenance Due  Topic Date Due   INFLUENZA VACCINE  04/25/2022   COLONOSCOPY (Pts 45-71yr Insurance coverage will need to be confirmed)  07/27/2024      12/20/2020    8:14 AM 02/14/2021    8:24 AM 05/18/2021    9:00 AM 08/01/2021    2:34 PM 09/15/2021    9:21 AM  Fall Risk  Falls in the past year? 0 0 0 0 0  Was there an injury with Fall? 0 0 0 0 0  Fall Risk Category Calculator 0 0 0 0 0  Fall Risk Category Low Low Low Low Low  Patient Fall Risk Level  Low fall risk Low fall risk     Functional Status Survey:    Vitals:   07/07/22 1250  BP: 133/86  Pulse: 73  Temp: (!) 97.3 F (36.3 C)  SpO2: 95%  Weight: 208 lb 12.8 oz (94.7 kg)  Height: '5\' 10"'  (1.778 m)   Body mass index is 29.96 kg/m. Physical Exam Vitals reviewed.  Constitutional:      General: He is not in acute distress.    Appearance: Normal appearance. He is normal weight. He is not ill-appearing or diaphoretic.  HENT:     Head: Normocephalic.     Right Ear: Tympanic membrane, ear canal and external ear normal.  There is no impacted cerumen.     Left Ear: Tympanic membrane, ear canal and external ear normal. There is no impacted cerumen.     Nose: Nose normal. No congestion or rhinorrhea.     Mouth/Throat:     Mouth: Mucous membranes are moist.     Pharynx: Oropharynx is clear. No oropharyngeal exudate or posterior oropharyngeal erythema.  Eyes:     General: No scleral icterus.       Right eye: No discharge.        Left eye: No discharge.     Extraocular Movements: Extraocular movements intact.     Conjunctiva/sclera: Conjunctivae normal.     Pupils: Pupils are equal, round, and reactive to light.  Neck:     Vascular: No carotid bruit.  Cardiovascular:     Rate and Rhythm: Normal rate and regular rhythm.     Pulses: Normal pulses.  Heart sounds: Normal heart sounds. No murmur heard.    No friction rub. No gallop.  Pulmonary:     Effort: Pulmonary effort is normal. No respiratory distress.     Breath sounds: Normal breath sounds. No wheezing, rhonchi or rales.  Chest:     Chest wall: No tenderness.  Abdominal:     General: Bowel sounds are normal. There is no distension.     Palpations: Abdomen is soft. There is no mass.     Tenderness: There is no abdominal tenderness. There is no right CVA tenderness, left CVA tenderness, guarding or rebound.  Musculoskeletal:        General: No swelling or tenderness. Normal range of motion.     Cervical back: Normal range of motion. No rigidity or tenderness.     Right lower leg: No edema.     Left lower leg: No edema.  Feet:     Right foot:     Skin integrity: No skin breakdown, erythema, warmth or callus.     Toenail Condition: Right toenails are abnormally thick and long.     Left foot:     Skin integrity: No skin breakdown, erythema, warmth or callus.     Toenail Condition: Left toenails are abnormally thick and long.     Comments: Left 3rd toenail missing " fell off per pt" no bleeding noted  Lymphadenopathy:     Cervical: No cervical  adenopathy.  Skin:    General: Skin is warm and dry.     Coloration: Skin is not pale.     Findings: No bruising, erythema, lesion or rash.  Neurological:     Mental Status: He is alert and oriented to person, place, and time.     Cranial Nerves: No cranial nerve deficit.     Sensory: No sensory deficit.     Motor: No weakness.     Coordination: Coordination normal.     Gait: Gait normal.  Psychiatric:        Mood and Affect: Mood normal.        Speech: Speech normal.        Behavior: Behavior normal.        Thought Content: Thought content normal.        Judgment: Judgment normal.     Labs reviewed: Recent Labs    04/03/22 1633  NA 145*  K 4.6  CL 102  CO2 27  GLUCOSE 83  BUN 10  CREATININE 0.98  CALCIUM 9.6   Recent Labs    04/03/22 1633  AST 24  ALT 17  ALKPHOS 80  BILITOT 0.4  PROT 7.6  ALBUMIN 4.5   Recent Labs    04/03/22 1633  WBC 6.2  HGB 11.9*  HCT 37.1*  MCV 88  PLT 205   Lab Results  Component Value Date   TSH 1.500 05/18/2021   Lab Results  Component Value Date   HGBA1C 5.4 10/13/2019   Lab Results  Component Value Date   CHOL 198 04/03/2022   HDL 30 (L) 04/03/2022   LDLCALC 114 (H) 04/03/2022   LDLDIRECT 62.0 10/09/2018   TRIG 307 (H) 04/03/2022   CHOLHDL 6.6 (H) 04/03/2022    Significant Diagnostic Results in last 30 days:  No results found.  Assessment/Plan 1. Encounter to establish care Previous medical records reviewed due for Influenza but would like to wait until later during the month.had COVID-19 vaccine 2 weeks ago. Recommend schedule fasting labs.  2. Essential hypertension B/p well  control Continue on amlodipine  - on ASA and Statin  - CBC with Differential/Platelet; Future - TSH; Future - CMP with eGFR([facility]); Future  3. Normocytic anemia Latest hgb on chart 11.9  - encouraged iron rich foods  MVI daily  - will recheck hgb then consider guaic stool if hgb still low.  - CBC with Differential/Platelet;  Future  4. Mixed hyperlipidemia TRG,LDL not at goal  Had length discussion on dietary modification and exercise. Recommend Aerobic exercise due to ongoing lower back pain.  - continue on atorvastatin  - Lipid Panel; Future  5. Numbness and tingling of both lower extremities Suspected related to lower back pain  Will obtain imaging and refer to Orthopedic  - Ambulatory referral to Orthopedic Surgery - DG Lumbar Spine Complete; Future  6. Sciatic leg pain Chronic  - follow up with Orthopedic   7. BPH without urinary obstruction Asymptomatic  - continue on Finasteride and Rapaflo   8. Former cigarette smoker Quit smoking 15 yrs ago used to smoke 1 and 1/2 pack daily - CT CHEST LUNG CA SCREEN LOW DOSE W/O CM; Future  9. Mid back pain, chronic Recommend salon pas  - Ambulatory referral to Orthopedic Surgery - DG Lumbar Spine Complete; Future  10. Overgrown toenails Overgrown toenails with fungal infection  Will refer to podiatrist to trim toenails - Ambulatory referral to Podiatry  Family/ staff Communication: Reviewed plan of care with patient verbalized understanding   Labs/tests ordered: - CBC with Differential/Platelet - CMP with eGFR([facility]) - TSH - Lipid panel  Next Appointment : Return in about 4 months (around 11/07/2022) for medical mangement of chronic issues., fasting labs in one week.   Sandrea Hughs, NP

## 2022-07-10 ENCOUNTER — Other Ambulatory Visit: Payer: Medicare Other

## 2022-07-10 ENCOUNTER — Ambulatory Visit
Admission: RE | Admit: 2022-07-10 | Discharge: 2022-07-10 | Disposition: A | Discharge status: Home / Self Care | Payer: Medicare Other | Source: Ambulatory Visit | Attending: Family | Admitting: Family

## 2022-07-10 DIAGNOSIS — E782 Mixed hyperlipidemia: Secondary | ICD-10-CM | POA: Diagnosis not present

## 2022-07-10 DIAGNOSIS — D649 Anemia, unspecified: Secondary | ICD-10-CM

## 2022-07-10 DIAGNOSIS — I10 Essential (primary) hypertension: Secondary | ICD-10-CM

## 2022-07-10 DIAGNOSIS — M545 Low back pain, unspecified: Secondary | ICD-10-CM | POA: Diagnosis not present

## 2022-07-10 DIAGNOSIS — M5136 Other intervertebral disc degeneration, lumbar region: Secondary | ICD-10-CM | POA: Diagnosis not present

## 2022-07-10 DIAGNOSIS — R202 Paresthesia of skin: Secondary | ICD-10-CM

## 2022-07-10 DIAGNOSIS — R200 Anesthesia of skin: Secondary | ICD-10-CM | POA: Diagnosis not present

## 2022-07-10 DIAGNOSIS — G8929 Other chronic pain: Secondary | ICD-10-CM

## 2022-07-11 ENCOUNTER — Other Ambulatory Visit: Payer: Self-pay | Admitting: Nurse Practitioner

## 2022-07-11 ENCOUNTER — Other Ambulatory Visit: Payer: Self-pay

## 2022-07-11 DIAGNOSIS — G8929 Other chronic pain: Secondary | ICD-10-CM

## 2022-07-11 DIAGNOSIS — E781 Pure hyperglyceridemia: Secondary | ICD-10-CM

## 2022-07-11 LAB — LIPID PANEL
Cholesterol: 229 mg/dL — ABNORMAL HIGH (ref <200)
HDL: 27 mg/dL — ABNORMAL LOW (ref >=40)
Non-HDL Cholesterol (Calc): 202 mg/dL (calc) — ABNORMAL HIGH (ref <130)
Total CHOL/HDL Ratio: 8.5 (calc) — ABNORMAL HIGH (ref <5.0)
Triglycerides: 576 mg/dL — ABNORMAL HIGH (ref <150)

## 2022-07-11 LAB — COMPLETE METABOLIC PANEL WITH GFR
AG Ratio: 1.3 (calc) (ref 1.0–2.5)
ALT: 10 U/L (ref 9–46)
AST: 20 U/L (ref 10–35)
Albumin: 4.4 g/dL (ref 3.6–5.1)
Alkaline phosphatase (APISO): 59 U/L (ref 35–144)
BUN: 11 mg/dL (ref 7–25)
CO2: 29 mmol/L (ref 20–32)
Calcium: 10.3 mg/dL (ref 8.6–10.3)
Chloride: 100 mmol/L (ref 98–110)
Creat: 0.83 mg/dL (ref 0.70–1.28)
Globulin: 3.5 g/dL (calc) (ref 1.9–3.7)
Glucose, Bld: 101 mg/dL — ABNORMAL HIGH (ref 65–99)
Potassium: 4.3 mmol/L (ref 3.5–5.3)
Sodium: 137 mmol/L (ref 135–146)
Total Bilirubin: 0.5 mg/dL (ref 0.2–1.2)
Total Protein: 7.9 g/dL (ref 6.1–8.1)
eGFR: 93 mL/min/{1.73_m2} (ref >=60)

## 2022-07-11 LAB — CBC WITH DIFFERENTIAL/PLATELET
Absolute Monocytes: 428 cells/uL (ref 200–950)
Basophils Absolute: 31 cells/uL (ref 0–200)
Basophils Relative: 0.6 %
Eosinophils Absolute: 199 cells/uL (ref 15–500)
Eosinophils Relative: 3.9 %
HCT: 39.4 % (ref 38.5–50.0)
Hemoglobin: 12.8 g/dL — ABNORMAL LOW (ref 13.2–17.1)
Lymphs Abs: 2458 cells/uL (ref 850–3900)
MCH: 28.8 pg (ref 27.0–33.0)
MCHC: 32.5 g/dL (ref 32.0–36.0)
MCV: 88.7 fL (ref 80.0–100.0)
MPV: 11.1 fL (ref 7.5–12.5)
Monocytes Relative: 8.4 %
Neutro Abs: 1984 cells/uL (ref 1500–7800)
Neutrophils Relative %: 38.9 %
Platelets: 252 10*3/uL (ref 140–400)
RBC: 4.44 10*6/uL (ref 4.20–5.80)
RDW: 12.2 % (ref 11.0–15.0)
Total Lymphocyte: 48.2 %
WBC: 5.1 10*3/uL (ref 3.8–10.8)

## 2022-07-11 LAB — TSH: TSH: 0.99 mIU/L (ref 0.40–4.50)

## 2022-07-11 MED ORDER — FENOFIBRATE 145 MG PO TABS: 145.0000 mg | ORAL_TABLET | Freq: Every day | ORAL | 1 refills | Status: DC

## 2022-07-17 ENCOUNTER — Ambulatory Visit: Payer: Medicare Other | Admitting: Physician Assistant

## 2022-07-17 ENCOUNTER — Encounter: Payer: Self-pay | Admitting: Physician Assistant

## 2022-07-17 DIAGNOSIS — M5416 Radiculopathy, lumbar region: Secondary | ICD-10-CM

## 2022-07-17 MED ORDER — METHYLPREDNISOLONE 4 MG PO TABS: ORAL_TABLET | ORAL | 0 refills | Status: DC

## 2022-07-17 MED ORDER — METHOCARBAMOL 500 MG PO TABS: 500.0000 mg | ORAL_TABLET | Freq: Four times a day (QID) | ORAL | 1 refills | Status: DC

## 2022-07-17 NOTE — Progress Notes (Signed)
Office Visit Note   Patient: Todd Mendoza           Date of Birth: 03/20/1951           MRN: [id number] Visit Date: 07/17/2022              Requested by: Sandrea Hughs, NP [address] [place],  [place] [address] PCP: Sandrea Hughs, NP   Assessment & Plan: Visit Diagnoses:  1. Lumbar radiculopathy     Plan: We will place him on a Medrol Dosepak and Robaxin.  Sending for formal therapy for his back will include modalities, core strengthening, stretching, and home exercise program.  We will see him back in 1 month see how he is doing overall.  Pain persist especially radicular symptoms would recommend MRI to rule out HNP as a source of his radicular symptoms.  Questions were encouraged and answered.  Follow-Up Instructions: Return in about 4 weeks (around 08/14/2022).   Orders:  No orders of the defined types were placed in this encounter.  No orders of the defined types were placed in this encounter.     Procedures: No procedures performed   Clinical Data: No additional findings.   Subjective: Chief Complaint  Patient presents with   Lower Back - Pain    HPI Todd Mendoza comes in today with new complaint of back pain.  He is having radicular symptoms down his entire left leg and having numbness in his right foot.  He states this has been ongoing for at least a few months.  He denies any waking pain but has difficulty going to sleep due to his back pain.  Ranks his pain to be 10 out of 10 pain in the low back.  Denies any bowel or bladder dysfunction, saddle anesthesia like symptoms, weight loss, fevers or chills.  He states that with sitting he has no numbness down the left leg but he has constant back pain and constant numbness in his right foot.  He has tried [employer] and arthritic creams along with Advil without any significant relief.  Said no acute injury to his low back.  Did undergo radiographs of his lumbar spine on 07/10/2022.  I personally reviewed these.  Films  show no acute fracture.  Anterior spinal listhesis grade 1 at L3 on 4 and L4-L5.  Retrolisthesis at L1-2 this is also a grade 1.  Lower lumbar spine significant facet changes throughout.  Degenerative disc disease at L4-5 moderate and mild L1-L2 3 degenerative disks narrowing.  Review of Systems  Constitutional:  Negative for chills, fever and unexpected weight change.  Musculoskeletal:  Positive for back pain.  Neurological:  Positive for numbness.     Objective: Vital Signs: There were no vitals taken for this visit.  Physical Exam Constitutional:      Appearance: He is normal weight. He is not ill-appearing or diaphoretic.  Cardiovascular:     Pulses: Normal pulses.  Neurological:     Mental Status: He is alert and oriented to person, place, and time.  Psychiatric:        Mood and Affect: Mood normal.     Ortho Exam Bilateral lower extremities 5 out of 5 strength throughout.  Full forward flexion limited extension lumbar spine.  Negative straight leg raise bilaterally. Specialty Comments:  No specialty comments available.  Imaging: No results found.   PMFS History: Patient Active Problem List   Diagnosis Date Noted   Chronic right shoulder pain 10/04/2020  Frequent headaches 10/13/2019   Hyperglycemia 10/11/2019   Hepatitis C antibody test positive 06/26/2019   DOE (dyspnea on exertion) 02/13/2019   Overweight (BMI 25.0-29.9) 02/13/2019   Anemia 04/30/2018   ED (erectile dysfunction) 11/03/2016   Lumbar radiculopathy 06/30/2016   Family history of diabetes mellitus 03/14/2016   Encounter for prostate cancer screening 03/14/2016   H/O: CVA (cerebrovascular accident) 12/26/2012   HTN (hypertension) 12/26/2012   HLD (hyperlipidemia) 12/26/2012   Allergic rhinitis 12/26/2012   Past Medical History:  Diagnosis Date   Hyperlipidemia    Hypertension    Stroke ([facility])    Vertigo     Family History  Problem Relation Age of Onset   Diabetes Father    Colon cancer  Neg Hx    Colon polyps Neg Hx    Esophageal cancer Neg Hx    Stomach cancer Neg Hx    Rectal cancer Neg Hx     Past Surgical History:  Procedure Laterality Date   COLONOSCOPY     NO PAST SURGERIES     Social History   Occupational History   Occupation: Administrator, [employer]  Tobacco Use   Smoking status: Former    Packs/day: 0.20    Types: Cigarettes   Smokeless tobacco: Never  Vaping Use   Vaping Use: Never used  Substance and Sexual Activity   Alcohol use: No    Alcohol/week: 0.0 standard drinks of alcohol   Drug use: No   Sexual activity: Yes    Birth control/protection: None

## 2022-07-18 NOTE — Addendum Note (Signed)
Addended by: Robyne Peers on: 07/18/2022 12:46 PM   Modules accepted: Orders

## 2022-07-20 ENCOUNTER — Ambulatory Visit: Payer: Medicare Other | Admitting: Podiatry

## 2022-07-20 DIAGNOSIS — M79675 Pain in left toe(s): Secondary | ICD-10-CM | POA: Diagnosis not present

## 2022-07-20 DIAGNOSIS — M79674 Pain in right toe(s): Secondary | ICD-10-CM | POA: Diagnosis not present

## 2022-07-20 DIAGNOSIS — B351 Tinea unguium: Secondary | ICD-10-CM | POA: Diagnosis not present

## 2022-07-20 MED ORDER — CICLOPIROX 8 % EX SOLN: Freq: Every day | CUTANEOUS | 0 refills | Status: DC

## 2022-07-20 NOTE — Progress Notes (Signed)
  Subjective:  Patient ID: Todd Mendoza, male    DOB: Jul 30, 1951,  MRN: [id number]  Chief Complaint  Patient presents with   Nail Problem    Routine Foot Care     71 y.o. male presents with the above complaint. History confirmed with patient. Patient presenting with pain related to dystrophic thickened elongated nails. Also concerned about nail fungus Patient is unable to trim own nails related to nail dystrophy and/or mobility issues. Patient does not have a history of T2DM.   Objective:  Physical Exam: warm, good capillary refill nail exam onychomycosis of the toenails, onycholysis, and dystrophic nails DP pulses palpable, PT pulses palpable, and protective sensation intact Left Foot:  Pain with palpation of nails due to elongation and dystrophic growth.  Right Foot: Pain with palpation of nails due to elongation and dystrophic growth.   Assessment:   1. Pain due to onychomycosis of toenails of both feet   2. Onychomycosis      Plan:  Patient was evaluated and treated and all questions answered.  #Onychomycosis -Educated on etiology of nail fungus. -eRx for penlac 8% solution daily  to all nails for 3 months  #Onychomycosis with pain  -Nails palliatively debrided as below. -Educated on self-care  Procedure: Nail Debridement Rationale: Pain Type of Debridement: manual, sharp debridement. Instrumentation: Nail nipper, rotary burr. Number of Nails: 10  Return in about 3 months (around 10/20/2022) for RFC, f/u fungus.         Everitt Amber, DPM [facility]

## 2022-07-26 ENCOUNTER — Ambulatory Visit: Payer: Medicare Other | Admitting: Physical Therapy

## 2022-08-02 DIAGNOSIS — M4316 Spondylolisthesis, lumbar region: Secondary | ICD-10-CM | POA: Diagnosis not present

## 2022-08-03 ENCOUNTER — Ambulatory Visit: Payer: Self-pay | Admitting: Licensed Clinical Social Worker

## 2022-08-03 NOTE — Patient Outreach (Signed)
  Care Coordination  Initial Visit Note   08/03/2022 Name: Todd Mendoza MRN: [id number] DOB: 1950-11-29  Todd Mendoza is a 71 y.o. year old male who sees Ngetich, Dinah C, NP for primary care. I spoke with  Todd Mendoza by phone today.  What matters to the patients health and wellness today?    Patient reports no concerns or needs from Care Coordination team with health and wellness related to physical or mental heath. .    Goals Addressed             This Visit's Progress    COMPLETED: Care Coordination Activities No Follow up Required       Care Coordination Interventions: Reviewed Care Coordination Services:Declined           SDOH assessments and interventions completed:  No    Care Coordination Interventions Activated:  Yes  Care Coordination Interventions:  No, not indicated   Follow up plan: No further intervention required.   Encounter Outcome:  Pt. Visit Completed   Todd Mendoza, [place] [phone]

## 2022-08-08 DIAGNOSIS — M48062 Spinal stenosis, lumbar region with neurogenic claudication: Secondary | ICD-10-CM | POA: Diagnosis not present

## 2022-08-08 DIAGNOSIS — M5126 Other intervertebral disc displacement, lumbar region: Secondary | ICD-10-CM | POA: Diagnosis not present

## 2022-08-08 DIAGNOSIS — M4807 Spinal stenosis, lumbosacral region: Secondary | ICD-10-CM | POA: Diagnosis not present

## 2022-08-09 ENCOUNTER — Ambulatory Visit
Admission: RE | Admit: 2022-08-09 | Discharge: 2022-08-09 | Disposition: A | Discharge status: Home / Self Care | Payer: Medicare Other | Source: Ambulatory Visit | Attending: Family | Admitting: Family

## 2022-08-09 DIAGNOSIS — Z87891 Personal history of nicotine dependence: Secondary | ICD-10-CM | POA: Diagnosis not present

## 2022-08-09 DIAGNOSIS — D739 Disease of spleen, unspecified: Secondary | ICD-10-CM | POA: Diagnosis not present

## 2022-08-09 DIAGNOSIS — J432 Centrilobular emphysema: Secondary | ICD-10-CM | POA: Diagnosis not present

## 2022-08-09 DIAGNOSIS — I2510 Atherosclerotic heart disease of native coronary artery without angina pectoris: Secondary | ICD-10-CM | POA: Diagnosis not present

## 2022-08-14 ENCOUNTER — Ambulatory Visit (INDEPENDENT_AMBULATORY_CARE_PROVIDER_SITE_OTHER): Payer: Medicare Other | Admitting: Family

## 2022-08-14 ENCOUNTER — Encounter: Payer: Self-pay | Admitting: Family

## 2022-08-14 ENCOUNTER — Ambulatory Visit: Payer: Medicare Other | Admitting: Physician Assistant

## 2022-08-14 VITALS — BP 127/78 | HR 69 | Temp 98.2°F | Resp 18 | Ht 70.0 in | Wt 216.4 lb

## 2022-08-14 DIAGNOSIS — E782 Mixed hyperlipidemia: Secondary | ICD-10-CM

## 2022-08-14 DIAGNOSIS — I700 Atherosclerosis of aorta: Secondary | ICD-10-CM

## 2022-08-14 DIAGNOSIS — I10 Essential (primary) hypertension: Secondary | ICD-10-CM | POA: Diagnosis not present

## 2022-08-14 DIAGNOSIS — I2510 Atherosclerotic heart disease of native coronary artery without angina pectoris: Secondary | ICD-10-CM | POA: Diagnosis not present

## 2022-08-14 DIAGNOSIS — J439 Emphysema, unspecified: Secondary | ICD-10-CM | POA: Diagnosis not present

## 2022-08-14 DIAGNOSIS — R911 Solitary pulmonary nodule: Secondary | ICD-10-CM | POA: Diagnosis not present

## 2022-08-14 MED ORDER — ATORVASTATIN CALCIUM 40 MG PO TABS: 40.0000 mg | ORAL_TABLET | Freq: Every day | ORAL | 1 refills | Status: DC

## 2022-08-14 NOTE — Progress Notes (Signed)
Provider: Marlowe Sax FNP-C  Todd Mendoza, Todd Bucks, NP  Patient Care Team: Todd Mendoza, Todd Bucks, NP as PCP - General (Family Medicine)  Extended Emergency Contact Information Primary Emergency Contact: [facility] Address: [address]          [place], [place] [address] Todd Mendoza of [place] Work Phone: [phone] Mobile Phone: [phone] Relation: Spouse  Code Status:  Full Code  Goals of care: Advanced Directive information    07/07/2022    1:04 PM  Advanced Directives  Does Patient Have a Medical Advance Directive? No  Would patient like information on creating a medical advance directive? Yes (Inpatient - patient defers creating a medical advance directive at this time - Information given)     Chief Complaint  Patient presents with   Follow-up    Patient is here to discuss CT scan results per PCP Marlowe Sax, NP.    HPI:  Pt is a 71 y.o. male seen today for an acute visit to discuss CT scan results.Low dose chest CT scan done  showed Aortic Atherosclerosis  with mild Cardiomegaly without pericardial effusion.Also noted Calcified and non-calcified nodules measuring 3.1 mm  Presumed sebaceous cyst on posterior left chest wall 2.1 cm. Emphysema noted. States sometimes has some discomfort across the chest for short period of time then goes away. She denies any fever,chills,cough,fatigue,body aches,runny nose,chest tightness,chest pain,palpitation or shortness of breath.   Past Medical History:  Diagnosis Date   Hyperlipidemia    Hypertension    Stroke [facility])    Vertigo    Past Surgical History:  Procedure Laterality Date   COLONOSCOPY     NO PAST SURGERIES      No Known Allergies  Outpatient Encounter Medications as of 08/14/2022  Medication Sig   amLODipine (NORVASC) 5 MG tablet TAKE 1 TABLET (5 MG TOTAL) BY MOUTH DAILY.   aspirin EC 81 MG tablet Take 1 tablet (81 mg total) by mouth daily.   atorvastatin (LIPITOR) 20 MG tablet TAKE 1 TABLET BY MOUTH  EVERY DAY   ciclopirox (PENLAC) 8 % solution Apply topically at bedtime. Apply over nail and surrounding skin. Apply daily over previous coat. After seven (7) days, may remove with alcohol and continue cycle.   fenofibrate (TRICOR) 145 MG tablet Take 1 tablet (145 mg total) by mouth daily.   finasteride (PROSCAR) 5 MG tablet TAKE 1 TABLET (5 MG TOTAL) BY MOUTH DAILY.   hydrochlorothiazide (HYDRODIURIL) 25 MG tablet TAKE 1 TABLET (25 MG TOTAL) BY MOUTH DAILY.   methocarbamol (ROBAXIN) 500 MG tablet Take 1 tablet (500 mg total) by mouth 4 (four) times daily.   methylPREDNISolone (MEDROL) 4 MG tablet Take as directed   Multiple Vitamins-Minerals (MULTIVITAMIN WITH MINERALS) tablet Take 1 tablet by mouth daily.   silodosin (RAPAFLO) 8 MG CAPS capsule Take 8 mg by mouth daily.   No facility-administered encounter medications on file as of 08/14/2022.    Review of Systems  Constitutional:  Negative for appetite change, chills, fatigue, fever and unexpected weight change.  Eyes:  Negative for pain, discharge, redness, itching and visual disturbance.  Respiratory:  Negative for cough, chest tightness, shortness of breath and wheezing.   Cardiovascular:  Negative for chest pain, palpitations and leg swelling.  Gastrointestinal:  Negative for abdominal distention, abdominal pain, constipation, diarrhea, nausea and vomiting.  Skin:  Negative for color change, pallor and rash.  Neurological:  Negative for dizziness, syncope, speech difficulty, weakness, light-headedness, numbness and headaches.    Immunization History  Administered Date(s) Administered  Fluad Quad(high Dose 65+) 06/24/2019, 06/21/2020   Influenza, High Dose Seasonal PF 09/17/2018   Influenza-Unspecified 06/01/2021   PFIZER(Purple Top)SARS-COV-2 Vaccination 11/24/2019, 12/23/2019   Pneumococcal Conjugate-13 04/11/2016   Pneumococcal Polysaccharide-23 05/15/2017   Tdap 04/02/2017   Zoster Recombinat (Shingrix) 10/18/2020,  12/20/2020   Pertinent  Health Maintenance Due  Topic Date Due   INFLUENZA VACCINE  04/25/2022   COLONOSCOPY (Pts 45-31yr Insurance coverage will need to be confirmed)  07/27/2024      02/14/2021    8:24 AM 05/18/2021    9:00 AM 08/01/2021    2:34 PM 09/15/2021    9:21 AM 08/14/2022    9:31 AM  Fall Risk  Falls in the past year? 0 0 0 0 0  Was there an injury with Fall? 0 0 0 0 0  Fall Risk Category Calculator 0 0 0 0 0  Fall Risk Category Low Low Low Low Low  Patient Fall Risk Level Low fall risk Low fall risk   Low fall risk  Patient at Risk for Falls Due to     No Fall Risks  Fall risk Follow up     Falls evaluation completed   Functional Status Survey:    Vitals:   08/14/22 0927  BP: 127/78  Pulse: 69  Resp: 18  Temp: 98.2 F (36.8 C)  SpO2: 98%  Weight: 216 lb 6 oz (98.1 kg)  Height: '5\' 10"'$  (1.778 m)   Body mass index is 31.05 kg/m. Physical Exam Vitals reviewed.  Constitutional:      General: He is not in acute distress.    Appearance: Normal appearance. He is obese. He is not ill-appearing or diaphoretic.  HENT:     Head: Normocephalic.     Mouth/Throat:     Mouth: Mucous membranes are moist.     Pharynx: Oropharynx is clear. No oropharyngeal exudate or posterior oropharyngeal erythema.  Eyes:     General: No scleral icterus.       Right eye: No discharge.        Left eye: No discharge.     Conjunctiva/sclera: Conjunctivae normal.     Pupils: Pupils are equal, round, and reactive to light.  Neck:     Vascular: No carotid bruit.  Cardiovascular:     Rate and Rhythm: Normal rate and regular rhythm.     Pulses: Normal pulses.     Heart sounds: Normal heart sounds. No murmur heard.    No friction rub. No gallop.  Pulmonary:     Effort: Pulmonary effort is normal. No respiratory distress.     Breath sounds: Normal breath sounds. No wheezing, rhonchi or rales.  Chest:     Chest wall: No tenderness.  Abdominal:     General: Bowel sounds are normal.  There is no distension.     Palpations: Abdomen is soft. There is no mass.     Tenderness: There is no abdominal tenderness. There is no right CVA tenderness, left CVA tenderness, guarding or rebound.  Musculoskeletal:        General: No swelling or tenderness. Normal range of motion.     Cervical back: Normal range of motion. No rigidity or tenderness.     Right lower leg: No edema.     Left lower leg: No edema.  Lymphadenopathy:     Cervical: No cervical adenopathy.  Skin:    General: Skin is warm and dry.     Coloration: Skin is not pale.     Findings: No  bruising, erythema or rash.  Neurological:     Mental Status: He is alert and oriented to person, place, and time.     Motor: No weakness.     Gait: Gait normal.  Psychiatric:        Mood and Affect: Mood normal.        Speech: Speech normal.        Behavior: Behavior normal.     Labs reviewed: Recent Labs    04/03/22 1633 07/10/22 0803  NA 145* 137  K 4.6 4.3  CL 102 100  CO2 27 29  GLUCOSE 83 101*  BUN 10 11  CREATININE 0.98 0.83  CALCIUM 9.6 10.3   Recent Labs    04/03/22 1633 07/10/22 0803  AST 24 20  ALT 17 10  ALKPHOS 80  --   BILITOT 0.4 0.5  PROT 7.6 7.9  ALBUMIN 4.5  --    Recent Labs    04/03/22 1633 07/10/22 0803  WBC 6.2 5.1  NEUTROABS  --  1,984  HGB 11.9* 12.8*  HCT 37.1* 39.4  MCV 88 88.7  PLT 205 252   Lab Results  Component Value Date   TSH 0.99 07/10/2022   Lab Results  Component Value Date   HGBA1C 5.4 10/13/2019   Lab Results  Component Value Date   CHOL 229 (H) 07/10/2022   HDL 27 (L) 07/10/2022   LDLCALC  07/10/2022     Comment:     . LDL cholesterol not calculated. Triglyceride levels greater than 400 mg/dL invalidate calculated LDL results. . Reference range: <100 . Desirable range <100 mg/dL for primary prevention;   <70 mg/dL for patients with CHD or diabetic patients  with > or = 2 CHD risk factors. Marland Kitchen LDL-C is now calculated using the Martin-Hopkins   calculation, which is a validated novel method providing  better accuracy than the Friedewald equation in the  estimation of LDL-C.  Cresenciano Genre et al. Annamaria Helling. 0865;784(69): 2061-2068  (http://education.[facility].com/faq/FAQ164)    LDLDIRECT 62.0 10/09/2018   TRIG 576 (H) 07/10/2022   CHOLHDL 8.5 (H) 07/10/2022    Significant Diagnostic Results in last 30 days:  CT CHEST LUNG CA SCREEN LOW DOSE W/O CM  Result Date: 08/10/2022 CLINICAL DATA:  73 pack-year smoking history/quit 8 years ago EXAM: CT CHEST WITHOUT CONTRAST LOW-DOSE FOR LUNG CANCER SCREENING TECHNIQUE: Multidetector CT imaging of the chest was performed following the standard protocol without IV contrast. RADIATION DOSE REDUCTION: This exam was performed according to the departmental dose-optimization program which includes automated exposure control, adjustment of the mA and/or kV according to patient size and/or use of iterative reconstruction technique. COMPARISON:  None Available. FINDINGS: Cardiovascular: Aortic atherosclerosis. Mild cardiomegaly, without pericardial effusion. Lad and right coronary artery calcification. Mediastinum/Nodes: No mediastinal or hilar adenopathy, given limitations of unenhanced CT. Lungs/Pleura: No pleural fluid. Mild centrilobular emphysema. Calcified and noncalcified pulmonary nodules. The largest noncalcified nodule measures volume derived equivalent diameter 3.1 mm. Upper Abdomen: Normal imaged portions of the liver, stomach, pancreas, adrenal glands, kidneys, gallbladder. Old granulomatous disease in the spleen. Musculoskeletal: Presumed sebaceous cyst about the posterior left chest wall a 2.1 cm on 18/2. IMPRESSION: 1. Lung-RADS 2, benign appearance or behavior. Continue annual screening with low-dose chest CT without contrast in 12 months. 2. Aortic atherosclerosis (ICD10-I70.0), coronary artery atherosclerosis and emphysema (ICD10-J43.9). Electronically Signed   By: Abigail Miyamoto M.D.   On:  08/10/2022 13:35    Assessment/Plan 1. Lung nodule Per recent CT scan done 08/10/2022 Calcified  and non-calcified nodules measuring 3.1 mm.  Annual chest CT scan CT scan recommended.Discussed and reviewed with patient recommend pulmonary referral for further evaluation and monitoring. - Ambulatory referral to Pulmonology - CT CHEST LUNG CA SCREEN LOW DOSE W/O CM; Future  2. Aortic atherosclerosis ([place]) Noted on recent chest CT scan 08/10/2022 -Advised to increase atorvastatin from 20 mg to 40 mg daily -Continue on aspirin EC 81 mg tablet daily -Dietary modification and exercise advised - COMPLETE METABOLIC PANEL WITH GFR; Future - CBC with Differential/Platelet; Future  3. Coronary artery arteriosclerosis Reports occasional chest discomfort lasting few minutes -Adjust atorvastatin as above and continue on aspirin -Advised to notify provider if symptoms recurs -Continue to control high risk factors  4. Pulmonary emphysema, unspecified emphysema type ([place]) Breathing stable this visit though reports chest discomfort location.  Recommend referral to pulmonary for evaluation of lung function. - Ambulatory referral to Pulmonology - CT CHEST LUNG CA SCREEN LOW DOSE W/O CM; Future  5. Mixed hyperlipidemia LDL not at goal -Dietary modification and exercise at least 3 times per week for 30 minutes -Adjust atorvastatin as above -Continue on fenofibrate - Lipid panel; Future  6. Essential hypertension Blood pressure well controlled -Continue on amlodipine and hydrochlorothiazide - TSH; Future - COMPLETE METABOLIC PANEL WITH GFR; Future - CBC with Differential/Platelet; Future  Family/ staff Communication: Reviewed plan of care with patient verbalized understanding  Labs/tests ordered:  - CT CHEST LUNG CA SCREEN LOW DOSE W/O CM; Future - TSH; Future - COMPLETE METABOLIC PANEL WITH GFR; Future - CBC with Differential/Platelet; Future - Lipid panel; Future  Next Appointment: Return  in about 3 months (around 11/14/2022) for Fasting labs prior to next visit .   Sandrea Hughs, NP

## 2022-08-15 ENCOUNTER — Other Ambulatory Visit: Payer: Self-pay | Admitting: Family

## 2022-08-15 DIAGNOSIS — I2510 Atherosclerotic heart disease of native coronary artery without angina pectoris: Secondary | ICD-10-CM

## 2022-08-15 DIAGNOSIS — I700 Atherosclerosis of aorta: Secondary | ICD-10-CM

## 2022-08-15 DIAGNOSIS — E782 Mixed hyperlipidemia: Secondary | ICD-10-CM

## 2022-08-15 NOTE — Telephone Encounter (Signed)
Please check with patient if medication can be send to another pharmacy.

## 2022-08-15 NOTE — Telephone Encounter (Signed)
Pharmacy comment: Product Backordered/Unavailable:ON BACKORDER!!

## 2022-08-15 NOTE — Telephone Encounter (Signed)
Pharmacy note pend and sent to PCP Ngetich, Nelda Bucks, NP .   Pharmacy comment: Product Backordered/Unavailable:ON BACKORDER.

## 2022-08-16 MED ORDER — ROSUVASTATIN CALCIUM 10 MG PO TABS: 10.0000 mg | ORAL_TABLET | Freq: Every day | ORAL | 0 refills | Status: DC

## 2022-08-16 MED ORDER — ROSUVASTATIN CALCIUM 20 MG PO TABS: 20.0000 mg | ORAL_TABLET | Freq: Every day | ORAL | 1 refills | Status: DC

## 2022-08-16 NOTE — Telephone Encounter (Signed)
send medication to another pharmacy. - Emphysema noted on CT scan but patient has no wheezing so we continue to monitor for now.He was also referred to pulmonologist for evaluation of lung nodules.Pulmonary test will be done by specialist then recommend need for inhalers if needed.

## 2022-08-16 NOTE — Telephone Encounter (Signed)
Discontinue atorvastatin 40 mg tablet then start on Crestor 10 mg tablet one by mouth daily x 1 month then increase to 20 mg tablet daily if tolerated.

## 2022-08-16 NOTE — Telephone Encounter (Addendum)
Late reply, yesterday I contacted pharmacy but they were on lunch. Today this afternoon I called pharmacy and they state that medication is Unavailable:ON BACKORDER  as previously stated multiple times for PCP. Pharmacist states that [facility] of the [facility] have this medication. She states that it's more than likely that no pharmacy is able to supply this medication but we can try sending it somewhere else.

## 2022-08-16 NOTE — Telephone Encounter (Signed)
Patients wife called to check the status of Todd Mendoza replying to this message. Patient needs an alternative as the prescribed medication is on back order.  Mrs.Gwyndolyn Saxon also asked what did Todd Mendoza send in for patients emphysema. I was unable to provide her with a response as the note remains open. Please advise on both concerns

## 2022-08-16 NOTE — Telephone Encounter (Signed)
Patient is aware of responses and rx's sent to pharmacy

## 2022-08-16 NOTE — Telephone Encounter (Signed)
Jasmine please advise if you followed though on message Dinah sent to you yesterday. I will then call patient back with a thorough explanation as to the status of this request

## 2022-08-23 ENCOUNTER — Encounter: Payer: Self-pay | Admitting: Emergency Medicine

## 2022-08-23 ENCOUNTER — Ambulatory Visit: Payer: Medicare Other | Admitting: Emergency Medicine

## 2022-08-23 VITALS — BP 140/76 | HR 68 | Temp 97.9°F | Ht 71.0 in | Wt 222.4 lb

## 2022-08-23 DIAGNOSIS — R0609 Other forms of dyspnea: Secondary | ICD-10-CM

## 2022-08-23 DIAGNOSIS — R918 Other nonspecific abnormal finding of lung field: Secondary | ICD-10-CM

## 2022-08-23 NOTE — Assessment & Plan Note (Signed)
Scattered pulmonary nodular disease, some calcified.  Largest noncalcified nodules 3 mm.  Benign appearance.  RADS 2 study.  Plan for repeat screening CT in 1 year, November 2024.

## 2022-08-23 NOTE — Addendum Note (Signed)
Addended by: Gavin Potters R on: 08/23/2022 02:52 PM   Modules accepted: Orders

## 2022-08-23 NOTE — Assessment & Plan Note (Signed)
Exertional shortness of breath with heavier exertion.  He is still quite active.  This in addition to his emphysematous change on CT chest would be consistent with COPD.  We talked about pulmonary function testing to quantify his degree of obstruction and then consider bronchodilator therapy.  We will get PFT at his next visit.

## 2022-08-23 NOTE — Patient Instructions (Signed)
We reviewed your CT scan of the chest today. We will plan for a repeat lung cancer screening CT chest in November 2024 We will arrange for pulmonary function testing in next office visit Follow with Dr. Lamonte Sakai next available with full pulmonary function testing on the same day.

## 2022-08-23 NOTE — Progress Notes (Signed)
Subjective:    Patient ID: Todd Mendoza, male    DOB: 12/21/1950, 71 y.o.   MRN: [id number]  HPI 71 year old gentleman, former smoker (30 pack years) with a history of hypertension, hyperlipidemia, cerebrovascular disease and stroke.  He is referred today for suspected COPD, pulmonary nodular disease noted on his chest imaging. Today he reports that he can get some exertional SOB with walking, extended activity. No real cough. He does sometimes hear rattle in his chest. He is able to do chores, work outside.  He is not on any bronchodilator regimen.  Lung cancer screening CT chest 08/09/2022 reviewed by me shows no mediastinal or hilar adenopathy, mild centrilobular emphysema with some scattered calcified and noncalcified pulmonary nodules.  The largest noncalcified nodule is 3 mm.  There is also some splenic calcification noted.  Review of Systems As per HPI  Past Medical History:  Diagnosis Date   Hyperlipidemia    Hypertension    Stroke ([place])    Vertigo      Family History  Problem Relation Age of Onset   Diabetes Father    Colon cancer Neg Hx    Colon polyps Neg Hx    Esophageal cancer Neg Hx    Stomach cancer Neg Hx    Rectal cancer Neg Hx      Social History   Socioeconomic History   Marital status: Married    Spouse name: Not on file   Number of children: Not on file   Years of education: Not on file   Highest education level: Not on file  Occupational History   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[employer]  Tobacco Use   Smoking status: Former    Packs/day: 0.20    Types: Cigarettes   Smokeless tobacco: Never  Vaping Use   Vaping Use: Never used  Substance and Sexual Activity   Alcohol use: No    Alcohol/week: 0.0 standard drinks of alcohol   Drug use: No   Sexual activity: Yes    Birth control/protection: None  Other Topics Concern   Not on file  Social History Narrative   Married   Psychologist, counselling daily   Social Determinants of Health   Financial Resource Strain:  Low Risk  (06/19/2021)   Overall Financial Resource Strain (CARDIA)    Difficulty of Paying Living Expenses: Not hard at all  Food Insecurity: No Food Insecurity (06/19/2021)   Hunger Vital Sign    Worried About Running Out of Food in the Last Year: Never true    Ran Out of Food in the Last Year: Never true  Transportation Needs: No Transportation Needs (06/19/2021)   PRAPARE - Hydrologist (Medical): No    Lack of Transportation (Non-Medical): No  Physical Activity: Sufficiently Active (06/19/2021)   Exercise Vital Sign    Days of Exercise per Week: 4 days    Minutes of Exercise per Session: 40 min  Stress: No Stress Concern Present (03/25/2020)   [place]    Feeling of Stress : Not at all  Social Connections: Moderately Integrated (06/19/2021)   Social Connection and Isolation Panel [NHANES]    Frequency of Communication with Friends and Family: Three times a week    Frequency of Social Gatherings with Friends and Family: Three times a week    Attends Religious Services: More than 4 times per year    Active Member of Clubs or Organizations: No    Attends Club or  Organization Meetings: Never    Marital Status: Married  Human resources officer Violence: Not At Risk (06/19/2021)   Humiliation, Afraid, Rape, and Kick questionnaire    Fear of Current or Ex-Partner: No    Emotionally Abused: No    Physically Abused: No    Sexually Abused: No    From [place], has lived in [place] and [place] Was in the [place]  Has worked Ambulance person, Nurse, adult, CNA  No Known Allergies   Outpatient Medications Prior to Visit  Medication Sig Dispense Refill   amLODipine (NORVASC) 5 MG tablet TAKE 1 TABLET (5 MG TOTAL) BY MOUTH DAILY. 90 tablet 3   aspirin EC 81 MG tablet Take 1 tablet (81 mg total) by mouth daily. 30 tablet 0   ciclopirox (PENLAC) 8 % solution Apply topically at bedtime. Apply over nail and surrounding skin. Apply  daily over previous coat. After seven (7) days, may remove with alcohol and continue cycle. 6.6 mL 0   fenofibrate (TRICOR) 145 MG tablet Take 1 tablet (145 mg total) by mouth daily. 90 tablet 1   finasteride (PROSCAR) 5 MG tablet TAKE 1 TABLET (5 MG TOTAL) BY MOUTH DAILY. 90 tablet 1   hydrochlorothiazide (HYDRODIURIL) 25 MG tablet TAKE 1 TABLET (25 MG TOTAL) BY MOUTH DAILY. 90 tablet 3   methocarbamol (ROBAXIN) 500 MG tablet Take 1 tablet (500 mg total) by mouth 4 (four) times daily. 40 tablet 1   Multiple Vitamins-Minerals (MULTIVITAMIN WITH MINERALS) tablet Take 1 tablet by mouth daily. 30 tablet 5   rosuvastatin (CRESTOR) 10 MG tablet Take 1 tablet (10 mg total) by mouth daily. X 30 days then begin 20 mg prescription 30 tablet 0   rosuvastatin (CRESTOR) 20 MG tablet Take 1 tablet (20 mg total) by mouth daily. Begin 09/15/22 90 tablet 1   silodosin (RAPAFLO) 8 MG CAPS capsule Take 8 mg by mouth daily.     methylPREDNISolone (MEDROL) 4 MG tablet Take as directed 21 tablet 0   No facility-administered medications prior to visit.         Objective:   Physical Exam Vitals:   08/23/22 1355  BP: (!) 140/76  Pulse: 68  Temp: 97.9 F (36.6 C)  TempSrc: Oral  SpO2: 96%  Weight: 222 lb 6.4 oz (100.9 kg)  Height: '5\' 11"'$  (1.803 m)   Gen: Pleasant, well-nourished, in no distress,  normal affect  ENT: No lesions,  mouth clear,  oropharynx clear, no postnasal drip  Neck: No JVD, no stridor  Lungs: No use of accessory muscles, no crackles or wheezing on normal respiration, no wheeze on forced expiration  Cardiovascular: RRR, heart sounds normal, no murmur or gallops, no peripheral edema  Musculoskeletal: No deformities, no cyanosis or clubbing  Neuro: alert, awake, non focal  Skin: Warm, no lesions or rash       Assessment & Plan:  DOE (dyspnea on exertion) Exertional shortness of breath with heavier exertion.  He is still quite active.  This in addition to his emphysematous  change on CT chest would be consistent with COPD.  We talked about pulmonary function testing to quantify his degree of obstruction and then consider bronchodilator therapy.  We will get PFT at his next visit.  Pulmonary nodules Scattered pulmonary nodular disease, some calcified.  Largest noncalcified nodules 3 mm.  Benign appearance.  RADS 2 study.  Plan for repeat screening CT in 1 year, November 2024.   Baltazar Apo, MD, PhD 08/23/2022, 2:29 PM [facility] [phone] or if  no answer before 7:00PM call [phone] For any issues after 7:00PM please call eLink [phone]

## 2022-08-25 DIAGNOSIS — M48062 Spinal stenosis, lumbar region with neurogenic claudication: Secondary | ICD-10-CM | POA: Diagnosis not present

## 2022-08-25 DIAGNOSIS — M4316 Spondylolisthesis, lumbar region: Secondary | ICD-10-CM | POA: Diagnosis not present

## 2022-08-28 DIAGNOSIS — L821 Other seborrheic keratosis: Secondary | ICD-10-CM | POA: Diagnosis not present

## 2022-08-28 DIAGNOSIS — L02212 Cutaneous abscess of back [any part, except buttock]: Secondary | ICD-10-CM | POA: Diagnosis not present

## 2022-08-28 DIAGNOSIS — L720 Epidermal cyst: Secondary | ICD-10-CM | POA: Diagnosis not present

## 2022-09-04 IMAGING — MR MR SHOULDER*R* W/O CM
4 of 5 series · 28 of 40 positions shown · non-contrast
Comparison: None.

CLINICAL DATA: Anterior right shoulder pain which intermittently
radiates into the right arm for 6 months. No known injury.

EXAM:
MRI OF THE RIGHT SHOULDER WITHOUT CONTRAST
TECHNIQUE: Multiplanar, multisequence MR imaging of the shoulder was performed.
No intravenous contrast was administered.

[Series 3: T2 fat-sat · axial · 4.0mm · 0.27mm/px · z∈[-65,+78]mm · 8 of 30 slices shown (1 of 3)]
[im 1/30]
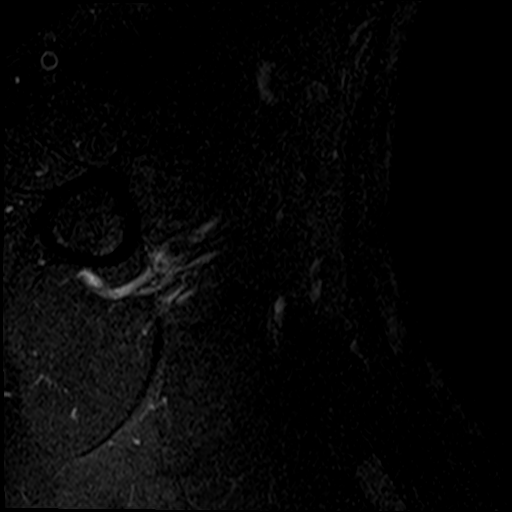
[im 4/30]
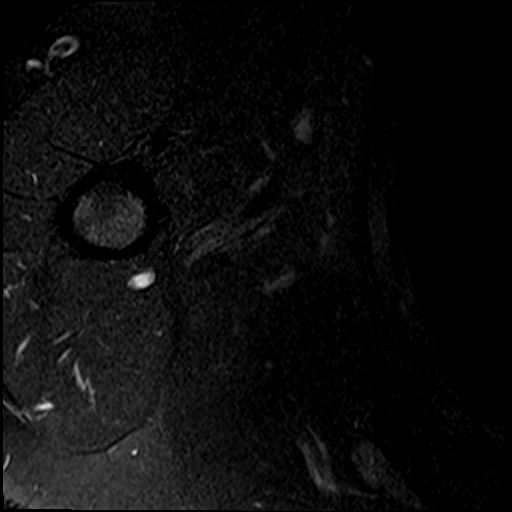
[im 10/30]
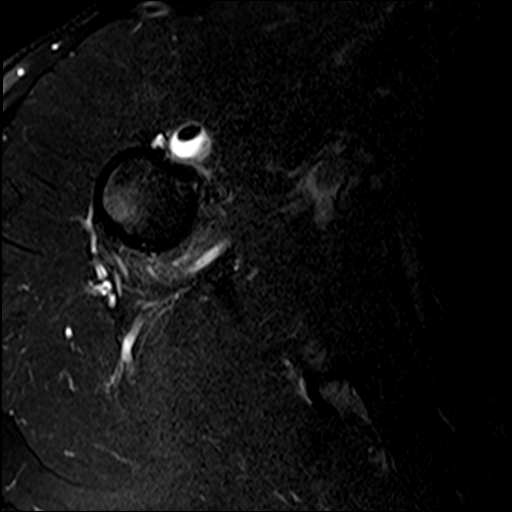
[im 13/30]
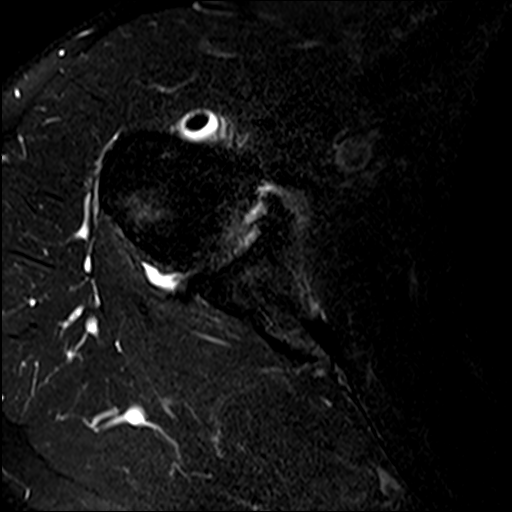
[im 17/30]
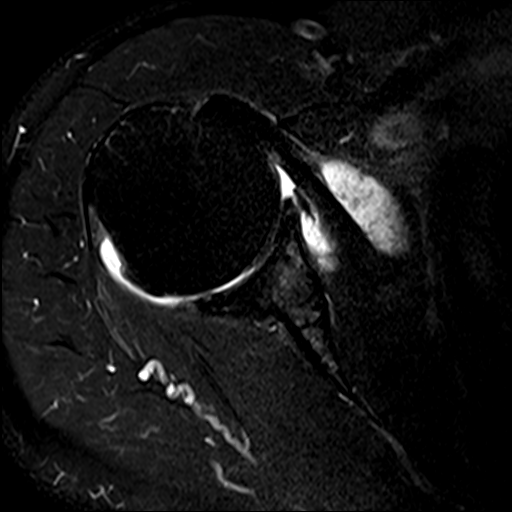
[im 20/30]
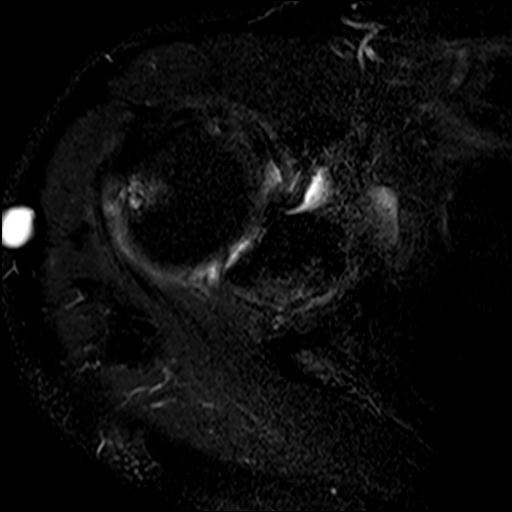
[im 26/30]
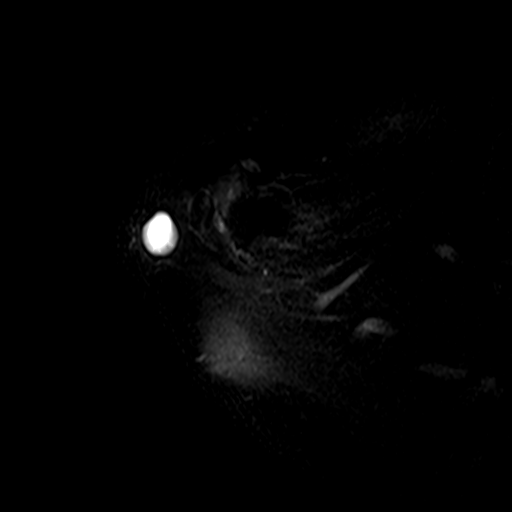
[im 30/30]
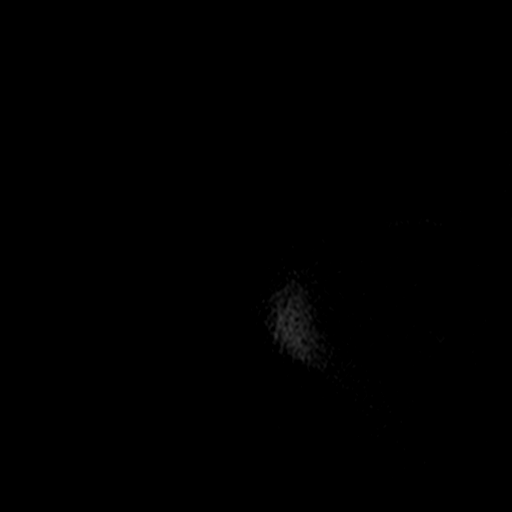

[Series 4: T2 fat-sat · oblique · 4.0mm · 0.55mm/px · 7 of 19 slices shown (2 of 3)]
[im 1/19]
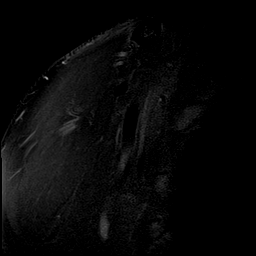
[im 4/19]
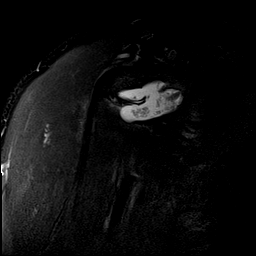
[im 7/19]
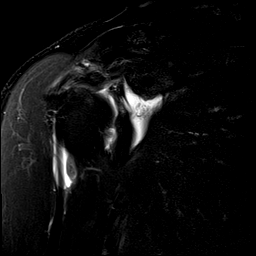
[im 10/19]
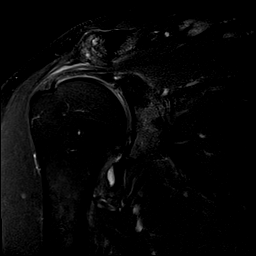
[im 13/19]
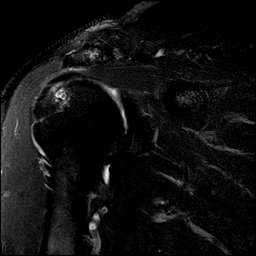
[im 16/19]
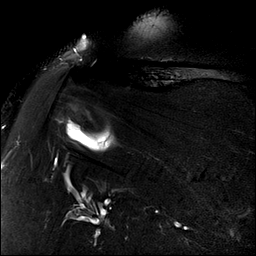
[im 19/19]
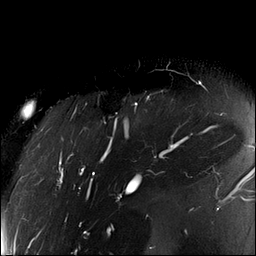

[Series 5: PD · oblique · 4.0mm · 0.27mm/px · 7 of 20 slices shown]
[im 1/20]
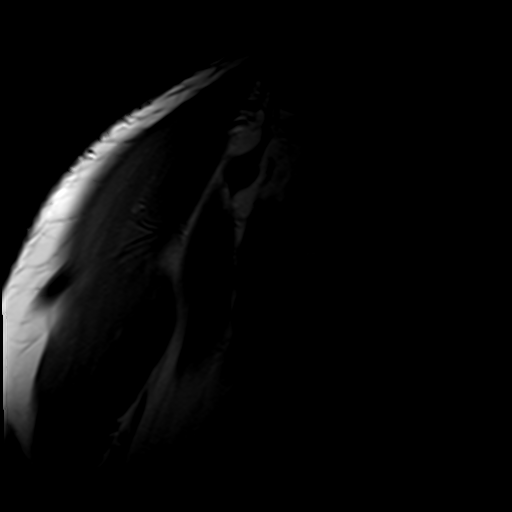
[im 4/20]
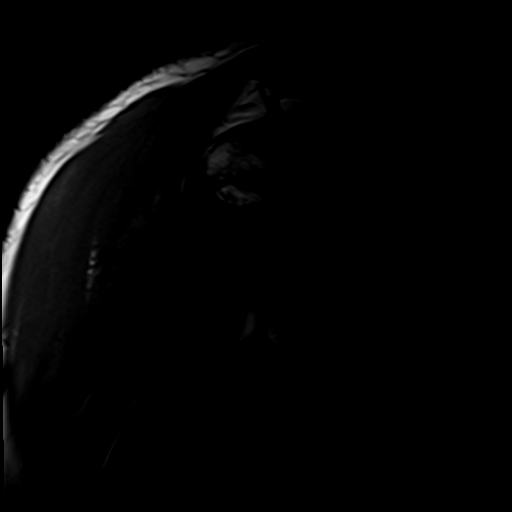
[im 7/20]
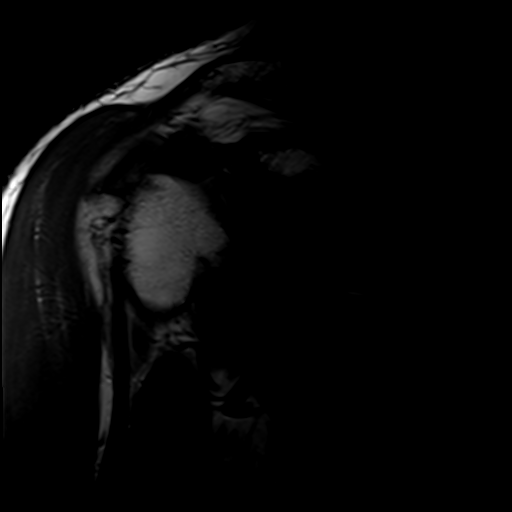
[im 10/20]
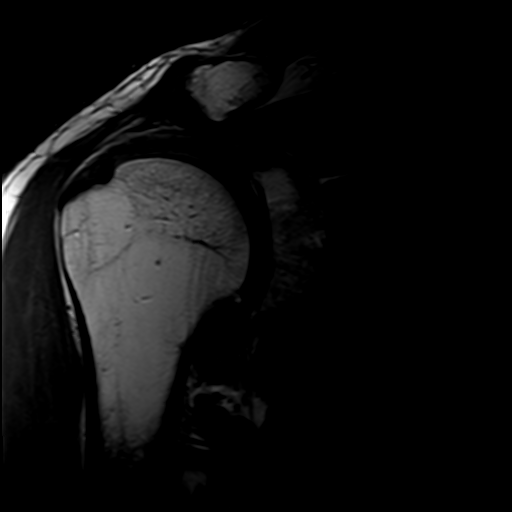
[im 13/20]
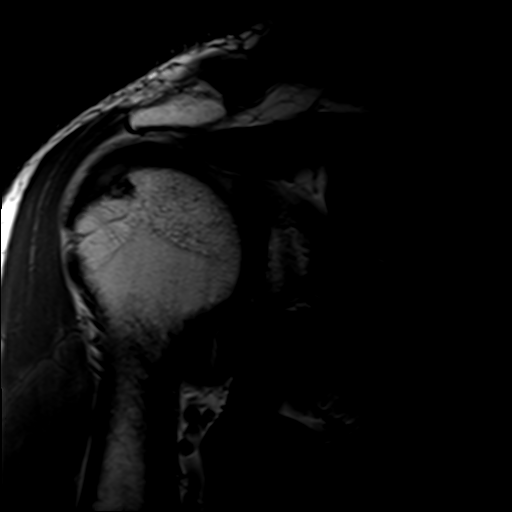
[im 16/20]
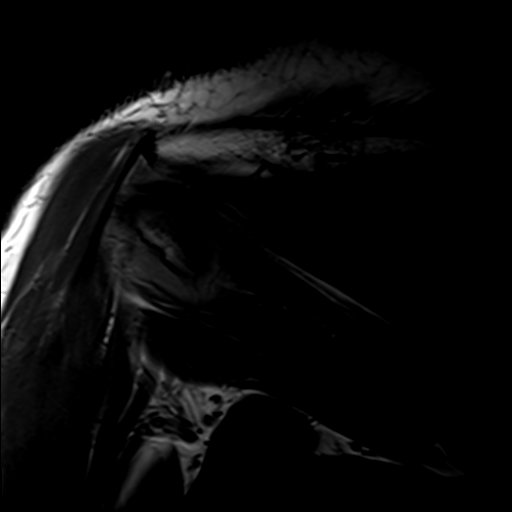
[im 20/20]
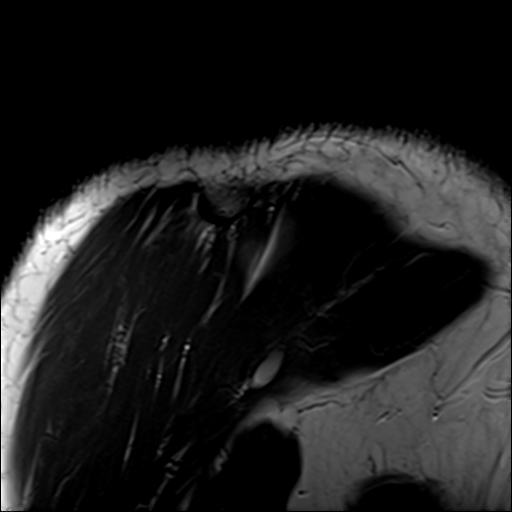

[Series 6: T2 fat-sat · oblique · 4.0mm · 0.55mm/px · 6 of 21 slices shown (3 of 3)]
[im 1/21]
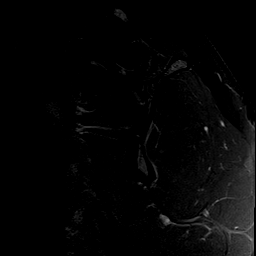
[im 3/21]
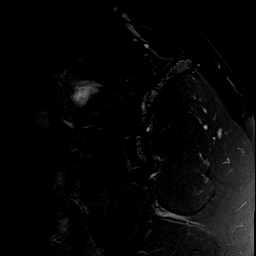
[im 6/21]
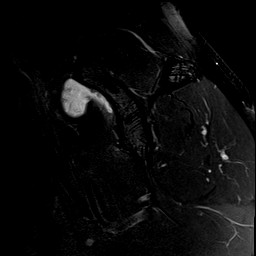
[im 9/21]
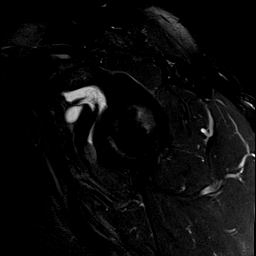
[im 12/21]
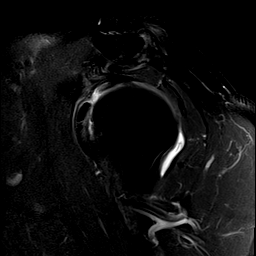
[im 18/21]
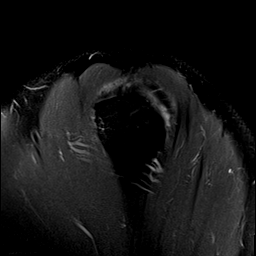

[28 of 40 positions shown; findings below may reference images not displayed]

FINDINGS: Rotator cuff: Intact. Mild rotator cuff tendinopathy is most notable
in the supraspinatus.

Muscles:  Normal.  No atrophy or focal lesion.

Biceps long head: Intact. Tendinopathy of the intra-articular
segment is noted.

Acromioclavicular Joint: Moderate to moderately severe
osteoarthritis. Type 2 acromion. There is a small volume of fluid in
the subacromial/subdeltoid bursa.

Glenohumeral Joint: Moderate osteoarthritis is present with
cartilage thinning, an osteophyte off the humeral head and a small
focus of subchondral edema in the anterior glenoid. A glenohumeral
joint effusion contains some debris.

Labrum:  The superior labrum is degenerated and blunted.

Bones:  No fracture or worrisome lesion.

Other: 2 cystic lesions are seen in the subcutaneous fatty tissues
about the shoulder identified. One immediately posterior to the AC
joint measures 1 cm in diameter. A second lesion just lateral to the
mid aspect of the deltoid measures 1.3 cm in diameter.
IMPRESSION: Mild appearing rotator cuff and intra-articular long head of biceps
tendinopathy without tear.

Moderate to moderately severe acromioclavicular osteoarthritis.

Moderate glenohumeral osteoarthritis.

Small volume of subacromial/subdeltoid fluid compatible with
bursitis.

Two cystic lesions in the subcutaneous fatty tissues about the
shoulder are likely sebaceous cysts.

## 2022-09-09 ENCOUNTER — Other Ambulatory Visit: Payer: Self-pay | Admitting: Family

## 2022-09-14 ENCOUNTER — Other Ambulatory Visit: Payer: Self-pay | Admitting: Neurological Surgery

## 2022-10-02 ENCOUNTER — Other Ambulatory Visit: Payer: Self-pay | Admitting: Internal Medicine

## 2022-10-03 ENCOUNTER — Ambulatory Visit (INDEPENDENT_AMBULATORY_CARE_PROVIDER_SITE_OTHER): Payer: Medicare Other | Admitting: Emergency Medicine

## 2022-10-03 DIAGNOSIS — R0609 Other forms of dyspnea: Secondary | ICD-10-CM

## 2022-10-03 LAB — PULMONARY FUNCTION TEST
DL/VA % pred: 86 %
DL/VA: 3.47 ml/min/mmHg/L
DLCO cor % pred: 70 %
DLCO cor: 18.67 ml/min/mmHg
DLCO unc % pred: 70 %
DLCO unc: 18.67 ml/min/mmHg
FEF 25-75 Post: 3.69 L/sec
FEF 25-75 Pre: 1.39 L/sec
FEF2575-%Change-Post: 166 %
FEF2575-%Pred-Post: 149 %
FEF2575-%Pred-Pre: 56 %
FEV1-%Change-Post: 21 %
FEV1-%Pred-Post: 64 %
FEV1-%Pred-Pre: 53 %
FEV1-Post: 2.15 L
FEV1-Pre: 1.77 L
FEV1FVC-%Change-Post: 9 %
FEV1FVC-%Pred-Pre: 105 %
FEV6-%Change-Post: 10 %
FEV6-%Pred-Post: 59 %
FEV6-%Pred-Pre: 53 %
FEV6-Post: 2.53 L
FEV6-Pre: 2.29 L
FEV6FVC-%Pred-Post: 106 %
FEV6FVC-%Pred-Pre: 106 %
FVC-%Change-Post: 10 %
FVC-%Pred-Post: 55 %
FVC-%Pred-Pre: 50 %
FVC-Post: 2.53 L
FVC-Pre: 2.29 L
Post FEV1/FVC ratio: 85 %
Post FEV6/FVC ratio: 100 %
Pre FEV1/FVC ratio: 77 %
Pre FEV6/FVC Ratio: 100 %
RV % pred: 103 %
RV: 2.64 L
TLC % pred: 86 %
TLC: 6.29 L

## 2022-10-03 NOTE — Progress Notes (Signed)
Full PFT performed today. °

## 2022-10-03 NOTE — Patient Instructions (Signed)
Full PFT performed today. °

## 2022-10-04 ENCOUNTER — Encounter: Payer: Self-pay | Admitting: Nurse Practitioner

## 2022-10-04 ENCOUNTER — Ambulatory Visit: Payer: Medicare Other | Admitting: Nurse Practitioner

## 2022-10-04 VITALS — BP 118/74 | HR 73 | Ht 70.0 in | Wt 217.6 lb

## 2022-10-04 DIAGNOSIS — R0609 Other forms of dyspnea: Secondary | ICD-10-CM

## 2022-10-04 DIAGNOSIS — J432 Centrilobular emphysema: Secondary | ICD-10-CM | POA: Diagnosis not present

## 2022-10-04 DIAGNOSIS — R918 Other nonspecific abnormal finding of lung field: Secondary | ICD-10-CM

## 2022-10-04 DIAGNOSIS — J4530 Mild persistent asthma, uncomplicated: Secondary | ICD-10-CM | POA: Diagnosis not present

## 2022-10-04 LAB — POCT EXHALED NITRIC OXIDE: FeNO level (ppb): 23

## 2022-10-04 MED ORDER — FLUTICASONE FUROATE-VILANTEROL 100-25 MCG/ACT IN AEPB: 1.0000 | INHALATION_SPRAY | Freq: Every day | RESPIRATORY_TRACT | 5 refills | Status: DC

## 2022-10-04 MED ORDER — ALBUTEROL SULFATE HFA 108 (90 BASE) MCG/ACT IN AERS: 2.0000 | INHALATION_SPRAY | Freq: Four times a day (QID) | RESPIRATORY_TRACT | 2 refills | Status: DC | PRN

## 2022-10-04 NOTE — Pre-Procedure Instructions (Signed)
Surgical Instructions    Your procedure is scheduled on Tuesday 10/10/22.   Report to [facility] Main Entrance "A" at 05:30 A.M., then check in with the Admitting office.  Call this number if you have problems the morning of surgery:  [phone]   If you have any questions prior to your surgery date call [phone]: Open Monday-Friday 8am-4pm If you experience any cold or flu symptoms such as cough, fever, chills, shortness of breath, etc. between now and your scheduled surgery, please notify us at the above number     Remember:  Do not eat after midnight the night before your surgery  You may drink clear liquids until 04:30 A.M. the morning of your surgery.   Clear liquids allowed are: Water, Non-Citrus Juices (without pulp), Carbonated Beverages, Clear Tea, Black Coffee ONLY (NO MILK, CREAM OR POWDERED CREAMER of any kind), and Gatorade    Take these medicines the morning of surgery with A SIP OF WATER:   amLODipine (NORVASC)   fenofibrate (TRICOR)   finasteride (PROSCAR)   rosuvastatin (CRESTOR)   silodosin (RAPAFLO)    albuterol (VENTOLIN HFA) 108 (90 Base)- If needed  fluticasone furoate-vilanterol (BREO ELLIPTA)- If needed   methocarbamol (ROBAXIN)- If needed   As of today, STOP taking any Aspirin (unless otherwise instructed by your surgeon) Aleve, Naproxen, Ibuprofen, Motrin, Advil, Goody's, BC's, all herbal medications, fish oil, and all vitamins.           Do not wear jewelry or makeup. Do not wear lotions, powders, perfumes/cologne or deodorant. Do not shave 48 hours prior to surgery.  Men may shave face and neck. Do not bring valuables to the hospital. Do not wear nail polish, gel polish, artificial nails, or any other type of covering on natural nails (fingers and toes) If you have artificial nails or gel coating that need to be removed by a nail salon, please have this removed prior to surgery. Artificial nails or gel coating may interfere with anesthesia's  ability to adequately monitor your vital signs.  [facility] is not responsible for any belongings or valuables.    Do NOT Smoke (Tobacco/Vaping)  24 hours prior to your procedure  If you use a CPAP at night, you may bring your mask for your overnight stay.   Contacts, glasses, hearing aids, dentures or partials may not be worn into surgery, please bring cases for these belongings   For patients admitted to the hospital, discharge time will be determined by your treatment team.   Patients discharged the day of surgery will not be allowed to drive home, and someone needs to stay with them for 24 hours.   SURGICAL WAITING ROOM VISITATION Patients having surgery or a procedure may have no more than 2 support people in the waiting area - these visitors may rotate.   Children under the age of 15 must have an adult with them who is not the patient. If the patient needs to stay at the hospital during part of their recovery, the visitor guidelines for inpatient rooms apply. Pre-op nurse will coordinate an appropriate time for 1 support person to accompany patient in pre-op.  This support person may not rotate.   Please refer to [email or web address] for the visitor guidelines for Inpatients (after your surgery is over and you are in a regular room).    Special instructions:    Oral Hygiene is also important to reduce your risk of infection.  Remember - BRUSH YOUR TEETH THE MORNING OF SURGERY WITH YOUR  REGULAR TOOTHPASTE   [place]- Preparing For Surgery  Before surgery, you can play an important role. Because skin is not sterile, your skin needs to be as free of germs as possible. You can reduce the number of germs on your skin by washing with CHG (chlorahexidine gluconate) Soap before surgery.  CHG is an antiseptic cleaner which kills germs and bonds with the skin to continue killing germs even after washing.     Please do not use if you  have an allergy to CHG or antibacterial soaps. If your skin becomes reddened/irritated stop using the CHG.  Do not shave (including legs and underarms) for at least 48 hours prior to first CHG shower. It is OK to shave your face.  Please follow these instructions carefully.     Shower the NIGHT BEFORE SURGERY and the MORNING OF SURGERY with CHG Soap.   If you chose to wash your hair, wash your hair first as usual with your normal shampoo. After you shampoo, rinse your hair and body thoroughly to remove the shampoo.  Then [employer] and genitals (private parts) with your normal soap and rinse thoroughly to remove soap.  After that Use CHG Soap as you would any other liquid soap. You can apply CHG directly to the skin and wash gently with a scrungie or a clean washcloth.   Apply the CHG Soap to your body ONLY FROM THE NECK DOWN.  Do not use on open wounds or open sores. Avoid contact with your eyes, ears, mouth and genitals (private parts). Wash Face and genitals (private parts)  with your normal soap.   Wash thoroughly, paying special attention to the area where your surgery will be performed.  Thoroughly rinse your body with warm water from the neck down.  DO NOT shower/wash with your normal soap after using and rinsing off the CHG Soap.  Pat yourself dry with a CLEAN TOWEL.  Wear CLEAN PAJAMAS to bed the night before surgery  Place CLEAN SHEETS on your bed the night before your surgery  DO NOT SLEEP WITH PETS.   Day of Surgery:  Take a shower with CHG soap. Wear Clean/Comfortable clothing the morning of surgery Do not apply any deodorants/lotions.   Remember to brush your teeth WITH YOUR REGULAR TOOTHPASTE.    If you received a COVID test during your pre-op visit, it is requested that you wear a mask when out in public, stay away from anyone that may not be feeling well, and notify your surgeon if you develop symptoms. If you have been in contact with anyone that has tested  positive in the last 10 days, please notify your surgeon.    Please read over the following fact sheets that you were given.

## 2022-10-04 NOTE — Progress Notes (Signed)
$'@Patient'm$  ID: Todd Mendoza, male    DOB: October 18, 1950, 72 y.o.   MRN: [id number]  Chief Complaint  Patient presents with   Follow-up    Pt f/u he is here to discuss his PFT from yesterday.     Referring provider: Ngetich, Nelda Bucks, NP  HPI: 72 year old male, former smoker followed for DOE, emphysema, and pulmonary nodules. He is a patient of Dr. Agustina Caroli and last seen in office 08/23/2022 for initial consult. Past medical history significant for HTN, allergic rhinitis, HLD.  TEST/EVENTS:  08/09/2022 LDCT chest: atherosclerosis. Mild centrilobular emphysema. Calcified and noncalcified nodules, largest measuring 3.1 mm. Lung RADS 2 10/03/2022 PFT: FVC 50, FEV1 53, ratio 85, TLC 86, DLCOcor 70. Positive bronchodilator response (21% change)  08/23/2022: OV with Dr. Lamonte Sakai for initial consult. Referred by his PC for suspected COPD. He also had LDCT for lung cancer screening with pulmonary nodular disease. Reports exertional SOB with walking, extended activity. No real cough. Does sometimes hear rattling in his chest. Able to do chores, work outside. Not on any BD. Emphysematous changes on imaging. PFTs ordered for further evaluation. Lung RADS 2 on imaging; plan for repeat in 1 year, November 2024.  10/04/2022: Today - follow up Patient presents today for follow-up after undergoing pulmonary function testing.  He had FVC of 50, FEV1 of 53, ratio of 85, TLC of 86 and DLCO 70.  He did have significant bronchodilator response with 21% improvement.  Today, he tells me that he is feeling relatively unchanged compared to when he was here last.  He has never been on any inhaler therapies.  He gets short winded with strenuous activity and long distance walking.  Really notices it with uphill/stair climbing.  He has an occasional cough, which is usually dry but sometimes will produce a small amount of clear mucus in the mornings.  Does notice an occasional wheeze.  No history of childhood asthma.  Occasionally has  some allergy type symptoms in the fall and spring.  No Known Allergies  Immunization History  Administered Date(s) Administered   Fluad Quad(high Dose 65+) 06/24/2019, 06/21/2020   Influenza, High Dose Seasonal PF 09/17/2018   Influenza-Unspecified 06/01/2021, 08/10/2022   PFIZER(Purple Top)SARS-COV-2 Vaccination 11/24/2019, 12/23/2019   Pneumococcal Conjugate-13 04/11/2016   Pneumococcal Polysaccharide-23 05/15/2017   Tdap 04/02/2017   Zoster Recombinat (Shingrix) 10/18/2020, 12/20/2020    Past Medical History:  Diagnosis Date   Hyperlipidemia    Hypertension    Stroke ([place])    Vertigo     Tobacco History: Social History   Tobacco Use  Smoking Status Former   Packs/day: 0.20   Types: Cigarettes  Smokeless Tobacco Never   Counseling given: Not Answered   Outpatient Medications Prior to Visit  Medication Sig Dispense Refill   amLODipine (NORVASC) 5 MG tablet TAKE 1 TABLET (5 MG TOTAL) BY MOUTH DAILY. 90 tablet 3   aspirin EC 81 MG tablet Take 1 tablet (81 mg total) by mouth daily. 30 tablet 0   fenofibrate (TRICOR) 145 MG tablet Take 1 tablet (145 mg total) by mouth daily. 90 tablet 1   finasteride (PROSCAR) 5 MG tablet TAKE 1 TABLET (5 MG TOTAL) BY MOUTH DAILY. 90 tablet 1   hydrochlorothiazide (HYDRODIURIL) 25 MG tablet TAKE 1 TABLET (25 MG TOTAL) BY MOUTH DAILY. 90 tablet 3   methocarbamol (ROBAXIN) 500 MG tablet Take 1 tablet (500 mg total) by mouth 4 (four) times daily. 40 tablet 1   methylPREDNISolone (MEDROL) 4 MG tablet Take  as directed 21 tablet 0   Multiple Vitamins-Minerals (MULTIVITAMIN WITH MINERALS) tablet Take 1 tablet by mouth daily. 30 tablet 5   rosuvastatin (CRESTOR) 20 MG tablet Take 1 tablet (20 mg total) by mouth daily. Begin 09/15/22 90 tablet 1   silodosin (RAPAFLO) 8 MG CAPS capsule Take 8 mg by mouth daily.     ciclopirox (PENLAC) 8 % solution Apply topically at bedtime. Apply over nail and surrounding skin. Apply daily over previous coat.  After seven (7) days, may remove with alcohol and continue cycle. (Patient not taking: Reported on 10/03/2022) 6.6 mL 0   rosuvastatin (CRESTOR) 10 MG tablet Take 1 tablet (10 mg total) by mouth daily. X 30 days then begin 20 mg prescription (Patient not taking: Reported on 10/04/2022) 90 tablet 1   No facility-administered medications prior to visit.     Review of Systems:   Constitutional: No weight loss or gain, night sweats, fevers, chills, fatigue, or lassitude. HEENT: No headaches, difficulty swallowing, tooth/dental problems, or sore throat. No sneezing, itching, ear ache, nasal congestion, or post nasal drip CV:  No chest pain, orthopnea, PND, swelling in lower extremities, anasarca, dizziness, palpitations, syncope Resp:+shortness of breath with exertion; occasional dry cough; occasional wheeze. No excess mucus or change in color of mucus. No hemoptysis.  No chest wall deformity GI:  No heartburn, indigestion, abdominal pain, nausea, vomiting, diarrhea, change in bowel habits, loss of appetite, bloody stools.  GU: No dysuria, change in color of urine, urgency or frequency.  No flank pain, no hematuria  Skin: No rash, lesions, ulcerations MSK:  No joint pain or swelling.  Neuro: No dizziness or lightheadedness.  Psych: No depression or anxiety. Mood stable.     Physical Exam:  BP 118/74   Pulse 73   Ht '5\' 10"'$  (1.778 m)   Wt 217 lb 9.6 oz (98.7 kg)   SpO2 99%   BMI 31.22 kg/m   GEN: Pleasant, interactive, well-appearing; obese; in no acute distress HEENT:  Normocephalic and atraumatic. PERRLA. Sclera white. Nasal turbinates pink, moist and patent bilaterally. No rhinorrhea present. Oropharynx pink and moist, without exudate or edema. No lesions, ulcerations, or postnasal drip.  NECK:  Supple w/ fair ROM. No JVD present. Normal carotid impulses w/o bruits. Thyroid symmetrical with no goiter or nodules palpated. No lymphadenopathy.   CV: RRR, no m/r/g, no peripheral edema. Pulses  intact, +2 bilaterally. No cyanosis, pallor or clubbing. PULMONARY:  Unlabored, regular breathing. Clear bilaterally A&P w/o wheezes/rales/rhonchi. No accessory muscle use.  GI: BS present and normoactive. Soft, non-tender to palpation. No organomegaly or masses detected.  MSK: No erythema, warmth or tenderness. Cap refil <2 sec all extrem. No deformities or joint swelling noted.  Neuro: A/Ox3. No focal deficits noted.   Skin: Warm, no lesions or rashe Psych: Normal affect and behavior. Judgement and thought content appropriate.     Lab Results:  CBC    Component Value Date/Time   WBC 5.1 07/10/2022 0803   RBC 4.44 07/10/2022 0803   HGB 12.8 (L) 07/10/2022 0803   HGB 11.9 (L) 04/03/2022 1633   HCT 39.4 07/10/2022 0803   HCT 37.1 (L) 04/03/2022 1633   PLT 252 07/10/2022 0803   PLT 205 04/03/2022 1633   MCV 88.7 07/10/2022 0803   MCV 88 04/03/2022 1633   MCH 28.8 07/10/2022 0803   MCHC 32.5 07/10/2022 0803   RDW 12.2 07/10/2022 0803   RDW 12.5 04/03/2022 1633   LYMPHSABS 2,458 07/10/2022 0803   LYMPHSABS 2.6  05/18/2021 0957   MONOABS 0.6 02/13/2019 0816   EOSABS 199 07/10/2022 0803   EOSABS 0.4 05/18/2021 0957   BASOSABS 31 07/10/2022 0803   BASOSABS 0.0 05/18/2021 0957    BMET    Component Value Date/Time   NA 137 07/10/2022 0803   NA 145 (H) 04/03/2022 1633   K 4.3 07/10/2022 0803   CL 100 07/10/2022 0803   CO2 29 07/10/2022 0803   GLUCOSE 101 (H) 07/10/2022 0803   BUN 11 07/10/2022 0803   BUN 10 04/03/2022 1633   CREATININE 0.83 07/10/2022 0803   CALCIUM 10.3 07/10/2022 0803   GFRNONAA 76 11/08/2020 0925   GFRAA 88 11/08/2020 0925    BNP No results found for: "BNP"   Imaging:  No results found.       Latest Ref Rng & Units 10/03/2022    3:13 PM  PFT Results  FVC-Pre L 2.29  P  FVC-Predicted Pre % 50  P  FVC-Post L 2.53  P  FVC-Predicted Post % 55  P  Pre FEV1/FVC % % 77  P  Post FEV1/FCV % % 85  P  FEV1-Pre L 1.77  P  FEV1-Predicted Pre % 53   P  FEV1-Post L 2.15  P  DLCO uncorrected ml/min/mmHg 18.67  P  DLCO UNC% % 70  P  DLCO corrected ml/min/mmHg 18.67  P  DLCO COR %Predicted % 70  P  DLVA Predicted % 86  P  TLC L 6.29  P  TLC % Predicted % 86  P  RV % Predicted % 103  P    P Preliminary result    No results found for: "NITRICOXIDE"      Assessment & Plan:   Mild persistent asthma His pulmonary function testing revealed moderate obstruction with reversibility, consistent with asthma.  He also has peripheral eosinophils on previous testing.  No formal diagnosis of COPD based on ratio of 85.  He does have a reduction in diffusion capacity, consistent with emphysema.  Disease process reviewed in depth today.  We will start him on maintenance bronchodilator regimen with Symbicort.  Teach back performed and medication education provided.  Discussed role of as needed [place] use.  Asthma action plan in place.  Patient Instructions  Start Symbicort 2 puffs Twice daily. Brush tongue and rinse mouth afterwards Albuterol inhaler 2 puffs every 6 hours as needed for shortness of breath or wheezing. This is your emergency inhaler. You can also use it 15 minutes prior to strenuous activity to help with your breathing, if needed  Activity encouraged, as tolerated Stay up to date on all of your vaccinations  Follow up in 6 weeks with Dr. Lamonte Sakai or Joellen Jersey Lutisha Knoche,NP to see how new inhaler is going. If symptoms worsen, please contact office for sooner follow up or seek emergency care.    DOE (dyspnea on exertion) He does have some restriction on testing as well.  Lung volumes were normal and he has no evidence of ILD on imaging.  Possibly related to obesity.  See above plan.  Pulmonary nodules Lung RADS 2. Repeat LDCT chest November 2024.   I spent 32 minutes of dedicated to the care of this patient on the date of this encounter to include pre-visit review of records, face-to-face time with the patient discussing conditions above, post  visit ordering of testing, clinical documentation with the electronic health record, making appropriate referrals as documented, and communicating necessary findings to members of the patients care team.  Karie Schwalbe  Maddelynn Moosman, NP 10/04/2022  Pt aware and understands NP's role.

## 2022-10-04 NOTE — Assessment & Plan Note (Signed)
Lung RADS 2. Repeat LDCT chest November 2024.

## 2022-10-04 NOTE — Assessment & Plan Note (Signed)
He does have some restriction on testing as well.  Lung volumes were normal and he has no evidence of ILD on imaging.  Possibly related to obesity.  See above plan.

## 2022-10-04 NOTE — Assessment & Plan Note (Addendum)
His pulmonary function testing revealed moderate obstruction with reversibility, consistent with asthma.  He also has peripheral eosinophils on previous testing.  No formal diagnosis of COPD based on ratio of 85.  Borderline FeNO at 23 ppb today. He does have a reduction in diffusion capacity, consistent with emphysema.  Disease process reviewed in depth today.  We will start him on maintenance bronchodilator regimen with Symbicort.  Teach back performed and medication education provided.  Discussed role of as needed [place] use.  Asthma action plan in place.  Patient Instructions  Start Symbicort 2 puffs Twice daily. Brush tongue and rinse mouth afterwards Albuterol inhaler 2 puffs every 6 hours as needed for shortness of breath or wheezing. This is your emergency inhaler. You can also use it 15 minutes prior to strenuous activity to help with your breathing, if needed  Activity encouraged, as tolerated Stay up to date on all of your vaccinations  Follow up in 6 weeks with Dr. Lamonte Sakai or Joellen Jersey Candela Krul,NP to see how new inhaler is going. If symptoms worsen, please contact office for sooner follow up or seek emergency care.

## 2022-10-04 NOTE — Patient Instructions (Addendum)
Start Symbicort 2 puffs Twice daily. Brush tongue and rinse mouth afterwards Albuterol inhaler 2 puffs every 6 hours as needed for shortness of breath or wheezing. This is your emergency inhaler. You can also use it 15 minutes prior to strenuous activity to help with your breathing, if needed  Activity encouraged, as tolerated Stay up to date on all of your vaccinations  Follow up in 6 weeks with Dr. Lamonte Sakai or Joellen Jersey Naziyah Tieszen,NP to see how new inhaler is going. If symptoms worsen, please contact office for sooner follow up or seek emergency care.

## 2022-10-05 ENCOUNTER — Other Ambulatory Visit: Payer: Self-pay

## 2022-10-05 ENCOUNTER — Encounter (HOSPITAL_COMMUNITY): Payer: Self-pay

## 2022-10-05 ENCOUNTER — Encounter (HOSPITAL_COMMUNITY)
Admission: RE | Admit: 2022-10-05 | Discharge: 2022-10-05 | Disposition: A | Discharge status: Home / Self Care | Payer: Medicare Other | Source: Ambulatory Visit | Attending: Neurological Surgery | Admitting: Neurological Surgery

## 2022-10-05 ENCOUNTER — Encounter (HOSPITAL_COMMUNITY): Payer: Self-pay | Admitting: Physician Assistant

## 2022-10-05 VITALS — BP 129/92 | HR 76 | Temp 97.8°F | Resp 17 | Ht 70.0 in | Wt 212.8 lb

## 2022-10-05 DIAGNOSIS — I10 Essential (primary) hypertension: Secondary | ICD-10-CM

## 2022-10-05 DIAGNOSIS — Z01818 Encounter for other preprocedural examination: Secondary | ICD-10-CM | POA: Diagnosis not present

## 2022-10-05 DIAGNOSIS — K759 Inflammatory liver disease, unspecified: Secondary | ICD-10-CM | POA: Diagnosis not present

## 2022-10-05 DIAGNOSIS — R9431 Abnormal electrocardiogram [ECG] [EKG]: Secondary | ICD-10-CM | POA: Diagnosis not present

## 2022-10-05 HISTORY — DX: Other psychoactive substance abuse, in remission: F19.11

## 2022-10-05 HISTORY — DX: Unspecified asthma, uncomplicated: J45.909

## 2022-10-05 HISTORY — DX: Inflammatory liver disease, unspecified: K75.9

## 2022-10-05 HISTORY — DX: Unspecified osteoarthritis, unspecified site: M19.90

## 2022-10-05 LAB — TYPE AND SCREEN
ABO/RH(D): A POS
Antibody Screen: NEGATIVE

## 2022-10-05 LAB — COMPREHENSIVE METABOLIC PANEL
ALT: 25 U/L (ref 0–44)
AST: 42 U/L — ABNORMAL HIGH (ref 15–41)
Albumin: 4.3 g/dL (ref 3.5–5.0)
Alkaline Phosphatase: 43 U/L (ref 38–126)
Anion gap: 8 (ref 5–15)
BUN: 7 mg/dL — ABNORMAL LOW (ref 8–23)
CO2: 27 mmol/L (ref 22–32)
Calcium: 9.9 mg/dL (ref 8.9–10.3)
Chloride: 107 mmol/L (ref 98–111)
Creatinine, Ser: 1.34 mg/dL — ABNORMAL HIGH (ref 0.61–1.24)
GFR, Estimated: 56 mL/min — ABNORMAL LOW (ref >60)
Glucose, Bld: 83 mg/dL (ref 70–99)
Potassium: 4 mmol/L (ref 3.5–5.1)
Sodium: 142 mmol/L (ref 135–145)
Total Bilirubin: 0.9 mg/dL (ref 0.3–1.2)
Total Protein: 8.1 g/dL (ref 6.5–8.1)

## 2022-10-05 LAB — CBC
HCT: 40.4 % (ref 39.0–52.0)
Hemoglobin: 12.4 g/dL — ABNORMAL LOW (ref 13.0–17.0)
MCH: 28.4 pg (ref 26.0–34.0)
MCHC: 30.7 g/dL (ref 30.0–36.0)
MCV: 92.4 fL (ref 80.0–100.0)
Platelets: 238 10*3/uL (ref 150–400)
RBC: 4.37 MIL/uL (ref 4.22–5.81)
RDW: 13.6 % (ref 11.5–15.5)
WBC: 5.7 10*3/uL (ref 4.0–10.5)
nRBC: 0 % (ref 0.0–0.2)

## 2022-10-05 LAB — SURGICAL PCR SCREEN
MRSA, PCR: NEGATIVE
Staphylococcus aureus: NEGATIVE

## 2022-10-05 NOTE — Pre-Procedure Instructions (Signed)
Surgical Instructions    Your procedure is scheduled on Tuesday 10/10/22.   Report to [facility] Main Entrance "A" at 05:30 A.M., then check in with the Admitting office.  Call this number if you have problems the morning of surgery:  [phone]   If you have any questions prior to your surgery date call [phone]: Open Monday-Friday 8am-4pm If you experience any cold or flu symptoms such as cough, fever, chills, shortness of breath, etc. between now and your scheduled surgery, please notify Todd Mendoza at the above number     Remember:  Do not eat after midnight the night before your surgery  You may drink clear liquids until 04:30 A.M. the morning of your surgery.   Clear liquids allowed are: Water, Non-Citrus Juices (without pulp), Carbonated Beverages, Clear Tea, Black Coffee ONLY (NO MILK, CREAM OR POWDERED CREAMER of any kind), and Gatorade    Take these medicines the morning of surgery with A SIP OF WATER:   amLODipine (NORVASC)   fenofibrate (TRICOR)   finasteride (PROSCAR)   rosuvastatin (CRESTOR)   silodosin (RAPAFLO)  methylPREDNISolone (MEDROL)    albuterol (VENTOLIN HFA) 108 (90 Base)- If needed- Please bring all inhalers with you the day of surgery.    fluticasone furoate-vilanterol (BREO ELLIPTA)- If needed    methocarbamol (ROBAXIN)- If needed  Follow your surgeon's instructions on when to stop Aspirin.  If no instructions were given by your surgeon then you will need to call the office to get those instructions.    As of today, STOP taking any  (unless otherwise instructed by your surgeon) Aleve, Naproxen, Ibuprofen, Motrin, Advil, Goody's, BC's, all herbal medications, fish oil, and all vitamins.            [facility] is not responsible for any belongings or valuables.    Do NOT Smoke (Tobacco/Vaping)  24 hours prior to your procedure  If you use a CPAP at night, you may bring your mask for your overnight stay.   Contacts, glasses, hearing aids, dentures or  partials may not be worn into surgery, please bring cases for these belongings   For patients admitted to the hospital, discharge time will be determined by your treatment team.   Patients discharged the day of surgery will not be allowed to drive home, and someone needs to stay with them for 24 hours.   SURGICAL WAITING ROOM VISITATION Patients having surgery or a procedure may have no more than 2 support people in the waiting area - these visitors may rotate.   Children under the age of 70 must have an adult with them who is not the patient. If the patient needs to stay at the hospital during part of their recovery, the visitor guidelines for inpatient rooms apply. Pre-op nurse will coordinate an appropriate time for 1 support person to accompany patient in pre-op.  This support person may not rotate.   Please refer to [email or web address] for the visitor guidelines for Inpatients (after your surgery is over and you are in a regular room).    Special instructions:    Oral Hygiene is also important to reduce your risk of infection.  Remember - BRUSH YOUR TEETH THE MORNING OF SURGERY WITH YOUR REGULAR TOOTHPASTE   - Preparing For Surgery  Before surgery, you can play an important role. Because skin is not sterile, your skin needs to be as free of germs as possible. You can reduce the number of germs on your skin by washing with CHG (chlorahexidine  gluconate) Soap before surgery.  CHG is an antiseptic cleaner which kills germs and bonds with the skin to continue killing germs even after washing.     Please do not use if you have an allergy to CHG or antibacterial soaps. If your skin becomes reddened/irritated stop using the CHG.  Do not shave (including legs and underarms) for at least 48 hours prior to first CHG shower. It is OK to shave your face.  Please follow these instructions carefully.     Shower the NIGHT BEFORE  SURGERY and the MORNING OF SURGERY with CHG Soap.   If you chose to wash your hair, wash your hair first as usual with your normal shampoo. After you shampoo, rinse your hair and body thoroughly to remove the shampoo.  Then [employer] and genitals (private parts) with your normal soap and rinse thoroughly to remove soap.  After that Use CHG Soap as you would any other liquid soap. You can apply CHG directly to the skin and wash gently with a scrungie or a clean washcloth.   Apply the CHG Soap to your body ONLY FROM THE NECK DOWN.  Do not use on open wounds or open sores. Avoid contact with your eyes, ears, mouth and genitals (private parts). Wash Face and genitals (private parts)  with your normal soap.   Wash thoroughly, paying special attention to the area where your surgery will be performed.  Thoroughly rinse your body with warm water from the neck down.  DO NOT shower/wash with your normal soap after using and rinsing off the CHG Soap.  Pat yourself dry with a CLEAN TOWEL.  Wear CLEAN PAJAMAS to bed the night before surgery  Place CLEAN SHEETS on your bed the night before your surgery  DO NOT SLEEP WITH PETS.   Day of Surgery:  Take a shower with CHG soap. Wear Clean/Comfortable clothing the morning of surgery Do not wear jewelry or makeup. Do not wear lotions, powders, perfumes/cologne or deodorant. Do not shave 48 hours prior to surgery.  Men may shave face and neck. Do not bring valuables to the hospital. Do not wear nail polish, gel polish, artificial nails, or any other type of covering on natural nails (fingers and toes) If you have artificial nails or gel coating that need to be removed by a nail salon, please have this removed prior to surgery. Artificial nails or gel coating may interfere with anesthesia's ability to adequately monitor your vital signs. Remember to brush your teeth WITH YOUR REGULAR TOOTHPASTE.    If you received a COVID test during your pre-op visit,  it is requested that you wear a mask when out in public, stay away from anyone that may not be feeling well, and notify your surgeon if you develop symptoms. If you have been in contact with anyone that has tested positive in the last 10 days, please notify your surgeon.    Please read over the following fact sheets that you were given.

## 2022-10-05 NOTE — Progress Notes (Incomplete)
PCP -    Ngetich, Nelda Bucks, NP   Cardiologist - denies Pulmonologist- Dr Lamonte Sakai, Delice Lesch, NP  PPM/ICD - n/a  Chest x-ray - N/A EKG - 10/05/2022  Stress Test - denies ECHO - denies Cardiac Cath - denies  Sleep Study - n/a  Fasting Blood Sugar - n/a   Blood Thinner Instructions: n/a Aspirin Instructions: Follow your surgeon's instructions on when to stop Aspirin.  If no instructions were given by your surgeon then you will need to call the office to get those instructions.  Pt reports that he was told to stop ASA- last dose on 10/01/22.   ERAS Protcol - per order, no drink   COVID TEST- N/A  Pt states he has not taken his BP medicine in 2 days because he wasn't sure which medications to take prior to surgery. Pt instructed to continue home medications prior to surgery.   Anesthesia review: yes, review EKG.   Patient denies shortness of breath, fever, cough and chest pain at PAT appointment   All instructions explained to the patient, with a verbal understanding of the material. Patient agrees to go over the instructions while at home for a better understanding. Patient also instructed to self quarantine after being tested for COVID-19. The opportunity to ask questions was provided.

## 2022-10-06 NOTE — Progress Notes (Signed)
Anesthesia Chart Review:  Case: [id number] Date/Time: 10/10/22 0715   Procedures:      L1 to S1 Laminectomies with L1 to S1 Posterolateral instrumented fusion     Application of O-Arm   Anesthesia type: General   Pre-op diagnosis: Lumbar stenosis with neurogenic claudication   Location: [facility] OR ROOM 21 / [facility]   Surgeons: Todd Part, MD       DISCUSSION: Patient is a 72 year old male scheduled for the above procedure.  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   Last visit with PCP Ngetich, Nelda Bucks, NP was on 08/14/22 to review 08/09/22 lung cancer screening chest CT that showed coronary atherosclerosis, calcified and noncalcified pulmonary nodules, mild emphysema. Notes suggests he had a prior episode of brief episode of chest discomfort, but no current chest pain or tightness. She continued ASA and adjusted statin dose. Advised follow-up if recurrent symptoms. He was referred to pulmonology for lung nodules and was diagnosed with asthma (see below).    Last pulmonology follow-up was on 10/04/22 with Todd Kitchen, NP. 10/03/22 PFTs results revealed moderate obstruction with reversibility, consistent with asthma. Known DOE with extended activity, able to do chores and work outside. No wheezing at that time. He was started on maintenance bronchodilator regimen with Symbicort and as needed albuterol MDI. Follow-up in 6 weeks planned to reassess. He also has chest CT planned for ~ 07/2023 for scattered pulmonary nodular disease on LDCT for lund cancer screening.   10/05/22 labs showed Creatinine 1.34, BUN 7, eGFR 56. Previously Creatinine 0.83-0.98 and eGFR 82-93 in July-October 2023. BP 129/92 at [name], but reportedly he had held his BP medications because he was unsure what he could take before surgery. [name] RN reviewed  preoperative instructions with him. He is on amlodipine and HCTZ for HTN.  He did not report any chest pain symptoms at his [name] RN visit. Recent pulmonology notes indicates that he is able to do chores and work outside. I did leave a voice message for patient to call me back to verify. His 10/05/22 EKG appears overall stable when compared to 05/18/21 tracing. He was recently started on Symbicort for asthma. In the interim, I did discuss available information with anesthesiologist Stoltzfus, Todd Cruise, DO. Anesthesia team to evaluate on the day of surgery. (UPDATE 10/09/22 9:26 AM: Patient called. He denied chest pain, SOB at rest, orthopnea, edema, palpitations, syncope. He says that he was working at the [employer] but within the past year he could not work due to back issues causing numbness in both legs with prolonged activity. Prior to that, he denied any activity limitations. His DOE is with more strenuous activities, so he oes not feel limited by his breathing. He denied wheezing. He has not had to use his rescue inhaler. He denied known complications with prior anesthesia.)     VS: BP (!) 129/92   Pulse 76   Temp 36.6 C   Resp 17   Ht '5\' 10"'$  (1.778 m)   Wt 96.5 kg   SpO2 98%   BMI 30.53 kg/m    PROVIDERS: Ngetich, Nelda Bucks, NP is PCP ([place]) Baltazar Apo, MD is pulmonologist   LABS: Preoperative labs noted. See DISCUSSION. (all labs ordered are listed, but only abnormal results are displayed)  Labs Reviewed  CBC - Abnormal; Notable for  the following components:      Result Value   Hemoglobin 12.4 (*)    All other components within normal limits  COMPREHENSIVE METABOLIC PANEL - Abnormal; Notable for the following components:   BUN 7 (*)    Creatinine, Ser 1.34 (*)    AST 42 (*)    GFR, Estimated 56 (*)    All other components within normal limits  SURGICAL PCR SCREEN  TYPE AND SCREEN    PFTs 10/03/22: FVC 50, FEV1 53, ratio 85, TLC 86, DLCOcor 70. Positive  bronchodilator response (21% change)    IMAGES: CT Chest lung cancer screen 08/09/22: IMPRESSION: 1. Lung-RADS 2, benign appearance or behavior. Continue annual screening with low-dose chest CT without contrast in 12 months. 2. Aortic atherosclerosis (ICD10-I70.0), coronary artery atherosclerosis and emphysema (ICD10-J43.9).    MRI L-spine 08/08/22 ([facility]/PACS): IMPRESSION: 1. Advanced multilevel degenerative changes of the lumbar spine with severe canal stenosis from L1-2 through L4-5 and moderate-to-severe canal stenosis at L5-S1. 2. Severe bilateral foraminal stenosis at L2-3, L3-4, and L5-S1.    EKG: 10/05/22: Normal sinus rhythm Inferior infarct , age undetermined Abnormal ECG When compared with ECG of 16-Dec-2012 10:07, Now with q waves in inferior leads Confirmed by Buford Dresser [phone]) on 10/06/2022 9:14:55 AM   CV: N/A   Past Medical History:  Diagnosis Date   Arthritis    Asthma    Hepatitis    treated for Hep C in the past   History of substance abuse ([place])    cocaine- over 20 years ago   Hyperlipidemia    Hypertension    Stroke ([place])    Vertigo     Past Surgical History:  Procedure Laterality Date   COLONOSCOPY     MULTIPLE TOOTH EXTRACTIONS     NO PAST SURGERIES      MEDICATIONS:  albuterol (VENTOLIN HFA) 108 (90 Base) MCG/ACT inhaler   amLODipine (NORVASC) 5 MG tablet   aspirin EC 81 MG tablet   ciclopirox (PENLAC) 8 % solution   fenofibrate (TRICOR) 145 MG tablet   finasteride (PROSCAR) 5 MG tablet   fluticasone furoate-vilanterol (BREO ELLIPTA) 100-25 MCG/ACT AEPB   hydrochlorothiazide (HYDRODIURIL) 25 MG tablet   methocarbamol (ROBAXIN) 500 MG tablet   methylPREDNISolone (MEDROL) 4 MG tablet   Multiple Vitamins-Minerals (MULTIVITAMIN WITH MINERALS) tablet   rosuvastatin (CRESTOR) 10 MG tablet   rosuvastatin (CRESTOR) 20 MG tablet   silodosin (RAPAFLO) 8 MG CAPS capsule   No current facility-administered medications for this  encounter.    Todd Gianotti, PA-C Surgical Short Stay/Anesthesiology [facility] Phone [phone] [facility] Phone [phone] 10/06/2022 1:45 PM

## 2022-10-06 NOTE — Anesthesia Preprocedure Evaluation (Signed)
Anesthesia Evaluation    Airway        Dental   Pulmonary former smoker          Cardiovascular hypertension,      Neuro/Psych    GI/Hepatic   Endo/Other    Renal/GU      Musculoskeletal   Abdominal   Peds  Hematology   Anesthesia Other Findings   Reproductive/Obstetrics                             Anesthesia Physical Anesthesia Plan  ASA:   Anesthesia Plan:    Post-op Pain Management:    Induction:   PONV Risk Score and Plan:   Airway Management Planned:   Additional Equipment:   Intra-op Plan:   Post-operative Plan:   Informed Consent:   Plan Discussed with:   Anesthesia Plan Comments: ([name] note written 10/06/2022 by Myra Gianotti, PA-C.  )       Anesthesia Quick Evaluation

## 2022-10-10 ENCOUNTER — Ambulatory Visit: Payer: Medicare Other | Admitting: Family

## 2022-10-10 ENCOUNTER — Encounter (HOSPITAL_COMMUNITY)
Admission: RE | Disposition: A | Discharge status: Home / Self Care | Payer: Self-pay | Source: Home / Self Care | Attending: Neurological Surgery

## 2022-10-10 ENCOUNTER — Telehealth: Payer: Self-pay

## 2022-10-10 ENCOUNTER — Ambulatory Visit (HOSPITAL_COMMUNITY)
Admission: RE | Admit: 2022-10-10 | Discharge: 2022-10-10 | Disposition: A | Discharge status: Home / Self Care | Payer: Medicare Other | Attending: Neurological Surgery | Admitting: Neurological Surgery

## 2022-10-10 DIAGNOSIS — Z539 Procedure and treatment not carried out, unspecified reason: Secondary | ICD-10-CM | POA: Diagnosis not present

## 2022-10-10 SURGERY — LAMINECTOMY WITH POSTERIOR LATERAL ARTHRODESIS LEVEL 4
Anesthesia: General

## 2022-10-10 MED ORDER — CHLORHEXIDINE GLUCONATE CLOTH 2 % EX PADS: 6.0000 | MEDICATED_PAD | Freq: Once | CUTANEOUS | Status: DC

## 2022-10-10 MED ORDER — CHLORHEXIDINE GLUCONATE 0.12 % MT SOLN: 15.0000 mL | Freq: Once | OROMUCOSAL | Status: DC

## 2022-10-10 MED ORDER — ORAL CARE MOUTH RINSE: 15.0000 mL | Freq: Once | OROMUCOSAL | Status: DC

## 2022-10-10 MED ORDER — CEFAZOLIN SODIUM-DEXTROSE 2-4 GM/100ML-% IV SOLN: 2.0000 g | INTRAVENOUS | Status: DC

## 2022-10-10 MED ORDER — LACTATED RINGERS IV SOLN: INTRAVENOUS | Status: DC

## 2022-10-10 NOTE — Telephone Encounter (Signed)
Patient walk in the office requesting for surgical clearance form. Patient legs go numb and pain in back and legs.  Surgery will be on October 19, 2022 Patient was not available to speak over the phone to set up an appointment to be seen for medical clearance for back surgery.

## 2022-10-10 NOTE — Progress Notes (Signed)
Hurley Cisco, RN received call from [place] at Dr. Colleen Can office regarding surgery.  Insurance has not given approval.  Surgery rescheduled for the 25th.

## 2022-10-11 ENCOUNTER — Ambulatory Visit: Payer: Medicare Other | Admitting: Emergency Medicine

## 2022-10-11 ENCOUNTER — Ambulatory Visit: Payer: Medicare Other | Admitting: Family

## 2022-10-11 ENCOUNTER — Encounter: Payer: Self-pay | Admitting: Family

## 2022-10-11 VITALS — BP 160/80 | HR 78 | Temp 97.3°F | Resp 16 | Ht 70.0 in | Wt 219.6 lb

## 2022-10-11 DIAGNOSIS — I10 Essential (primary) hypertension: Secondary | ICD-10-CM | POA: Diagnosis not present

## 2022-10-11 DIAGNOSIS — Z01818 Encounter for other preprocedural examination: Secondary | ICD-10-CM

## 2022-10-11 NOTE — Progress Notes (Signed)
Provider: Marlowe Sax FNP-C  Kimberla Driskill, Nelda Bucks, NP  Patient Care Team: Dontrelle Mazon, Nelda Bucks, NP as PCP - General (Family Medicine)  Extended Emergency Contact Information Primary Emergency Contact: [facility] Address: [address]          [place], [place] [address] [place] Mobile Phone: [phone] Relation: Spouse  Code Status:  Full Code  Goals of care: Advanced Directive information    10/11/2022   11:04 AM  Advanced Directives  Does Patient Have a Medical Advance Directive? No  Does patient want to make changes to medical advance directive? No - Patient declined     Chief Complaint  Patient presents with   Pre-op Exam    Surgical Clearance.     HPI:  Pt is a 72 y.o. male seen today for an acute visit for evaluation of pre-op clearance for L 1- S 1 Laminectomy with Neurosurgeon Emelda Brothers.He is here with wife states was patient had appointment for back surgery on 10/10/2022 but was told insurance has not cleared for surgery.Also states need surgical clearance from PCP.No surgical clearance form received from Dr.Ostergard's office. [facility] CMA has called and left voicemail requesting surgical clearance form.  Has new date  States has back,leg pain and legs feel numb. Denies any weakness on the legs.  Also denies any new acute issues. States was seen by Pulmonologist at [facility] on 10/04/2022 by Kerry Fort for Emphysema ,pulmonary nodules and DOE. His pulmonary function testing showed moderate obstruction with reversibility which is consistent with Asthma.Also indicated a reduction in diffusion capacity consistent with Emphysema.He was started on maintenance bronchodilator Symbicort. States feeling better.  He denies any fever,chills,cough,fatigue,body aches,runny nose,chest tightness,chest pain or palpitation.  Had CBC,CMP and EKG done 10/05/2022. Hgb 12.4 previous 12.8.He denies any signs of bleeding or dark stool.    Past  Medical History:  Diagnosis Date   Arthritis    Asthma    Hepatitis    treated for Hep C in the past   History of substance abuse ([place])    cocaine- over 20 years ago   Hyperlipidemia    Hypertension    Stroke [facility])    Vertigo    Past Surgical History:  Procedure Laterality Date   COLONOSCOPY     MULTIPLE TOOTH EXTRACTIONS     NO PAST SURGERIES      No Known Allergies  Outpatient Encounter Medications as of 10/11/2022  Medication Sig   albuterol (VENTOLIN HFA) 108 (90 Base) MCG/ACT inhaler Inhale 2 puffs into the lungs every 6 (six) hours as needed for wheezing or shortness of breath.   amLODipine (NORVASC) 5 MG tablet TAKE 1 TABLET (5 MG TOTAL) BY MOUTH DAILY.   aspirin EC 81 MG tablet Take 1 tablet (81 mg total) by mouth daily.   fenofibrate (TRICOR) 145 MG tablet Take 1 tablet (145 mg total) by mouth daily.   finasteride (PROSCAR) 5 MG tablet TAKE 1 TABLET (5 MG TOTAL) BY MOUTH DAILY.   fluticasone furoate-vilanterol (BREO ELLIPTA) 100-25 MCG/ACT AEPB Inhale 1 puff into the lungs daily.   hydrochlorothiazide (HYDRODIURIL) 25 MG tablet TAKE 1 TABLET (25 MG TOTAL) BY MOUTH DAILY.   methocarbamol (ROBAXIN) 500 MG tablet Take 1 tablet (500 mg total) by mouth 4 (four) times daily.   methylPREDNISolone (MEDROL) 4 MG tablet Take as directed   Multiple Vitamins-Minerals (MULTIVITAMIN WITH MINERALS) tablet Take 1 tablet by mouth daily.   rosuvastatin (CRESTOR) 20 MG tablet Take 1 tablet (20 mg  total) by mouth daily. Begin 09/15/22   silodosin (RAPAFLO) 8 MG CAPS capsule Take 8 mg by mouth daily.   [DISCONTINUED] ciclopirox (PENLAC) 8 % solution Apply topically at bedtime. Apply over nail and surrounding skin. Apply daily over previous coat. After seven (7) days, may remove with alcohol and continue cycle. (Patient not taking: Reported on 10/03/2022)   [DISCONTINUED] rosuvastatin (CRESTOR) 10 MG tablet Take 1 tablet (10 mg total) by mouth daily. X 30 days then begin 20 mg prescription  (Patient not taking: Reported on 10/04/2022)   No facility-administered encounter medications on file as of 10/11/2022.    Review of Systems  Constitutional:  Negative for appetite change, chills, fatigue, fever and unexpected weight change.  HENT:  Negative for congestion, dental problem, ear discharge, ear pain, facial swelling, hearing loss, nosebleeds, postnasal drip, rhinorrhea, sinus pressure, sinus pain, sneezing, sore throat, tinnitus and trouble swallowing.   Eyes:  Negative for pain, discharge, redness, itching and visual disturbance.  Respiratory:  Negative for cough, chest tightness, shortness of breath and wheezing.   Cardiovascular:  Negative for chest pain, palpitations and leg swelling.  Gastrointestinal:  Negative for abdominal distention, abdominal pain, blood in stool, constipation, diarrhea, nausea and vomiting.  Endocrine: Negative for cold intolerance, heat intolerance, polydipsia, polyphagia and polyuria.  Genitourinary:  Negative for difficulty urinating, dysuria, flank pain, frequency and urgency.  Musculoskeletal:  Negative for arthralgias, back pain, gait problem, joint swelling, myalgias, neck pain and neck stiffness.  Skin:  Negative for color change, pallor, rash and wound.  Neurological:  Negative for dizziness, syncope, speech difficulty, weakness, light-headedness, numbness and headaches.  Hematological:  Does not bruise/bleed easily.  Psychiatric/Behavioral:  Negative for agitation, behavioral problems, confusion, hallucinations, self-injury, sleep disturbance and suicidal ideas. The patient is not nervous/anxious.     Immunization History  Administered Date(s) Administered   Fluad Quad(high Dose 65+) 06/24/2019, 06/21/2020   Influenza, High Dose Seasonal PF 09/17/2018   Influenza-Unspecified 06/01/2021, 08/10/2022   PFIZER(Purple Top)SARS-COV-2 Vaccination 11/24/2019, 12/23/2019   Pneumococcal Conjugate-13 04/11/2016   Pneumococcal Polysaccharide-23  05/15/2017   Tdap 04/02/2017   Zoster Recombinat (Shingrix) 10/18/2020, 12/20/2020   Pertinent  Health Maintenance Due  Topic Date Due   COLONOSCOPY (Pts 45-32yr Insurance coverage will need to be confirmed)  07/27/2024   INFLUENZA VACCINE  Completed      05/18/2021    9:00 AM 08/01/2021    2:34 PM 09/15/2021    9:21 AM 08/14/2022    9:31 AM 10/11/2022   11:04 AM  Fall Risk  Falls in the past year? 0 0 0 0 0  Was there an injury with Fall? 0 0 0 0 0  Fall Risk Category Calculator 0 0 0 0 0  Fall Risk Category (Retired) Low Low Low Low   (RETIRED) Patient Fall Risk Level Low fall risk   Low fall risk   Patient at Risk for Falls Due to    No Fall Risks No Fall Risks  Fall risk Follow up    Falls evaluation completed Falls evaluation completed   Functional Status Survey:    Vitals:   10/11/22 1102  BP: (!) 160/80  Pulse: 78  Resp: 16  Temp: (!) 97.3 F (36.3 C)  SpO2: 95%  Weight: 219 lb 9.6 oz (99.6 kg)  Height: '5\' 10"'$  (1.778 m)   Body mass index is 31.51 kg/m. Physical Exam Vitals reviewed.  Constitutional:      General: He is not in acute distress.    Appearance: Normal  appearance. He is obese. He is not ill-appearing or diaphoretic.  HENT:     Head: Normocephalic.     Right Ear: Tympanic membrane, ear canal and external ear normal. There is no impacted cerumen.     Left Ear: Tympanic membrane, ear canal and external ear normal. There is no impacted cerumen.     Nose: Nose normal. No congestion or rhinorrhea.     Mouth/Throat:     Mouth: Mucous membranes are moist.     Pharynx: Oropharynx is clear. No oropharyngeal exudate or posterior oropharyngeal erythema.  Eyes:     General: No scleral icterus.       Right eye: No discharge.        Left eye: No discharge.     Extraocular Movements: Extraocular movements intact.     Conjunctiva/sclera: Conjunctivae normal.     Pupils: Pupils are equal, round, and reactive to light.  Neck:     Vascular: No carotid bruit.   Cardiovascular:     Rate and Rhythm: Normal rate and regular rhythm.     Pulses: Normal pulses.     Heart sounds: Normal heart sounds. No murmur heard.    No friction rub. No gallop.  Pulmonary:     Effort: Pulmonary effort is normal. No respiratory distress.     Breath sounds: Normal breath sounds. No wheezing, rhonchi or rales.  Chest:     Chest wall: No tenderness.  Abdominal:     General: Bowel sounds are normal. There is no distension.     Palpations: Abdomen is soft. There is no mass.     Tenderness: There is no abdominal tenderness. There is no right CVA tenderness, left CVA tenderness, guarding or rebound.  Musculoskeletal:        General: No swelling or tenderness. Normal range of motion.     Cervical back: Normal range of motion. No rigidity or tenderness.     Right lower leg: No edema.     Left lower leg: No edema.  Lymphadenopathy:     Cervical: No cervical adenopathy.  Skin:    General: Skin is warm and dry.     Coloration: Skin is not pale.     Findings: No bruising, erythema, lesion or rash.  Neurological:     Mental Status: He is alert and oriented to person, place, and time.     Cranial Nerves: No cranial nerve deficit.     Sensory: No sensory deficit.     Motor: No weakness.     Coordination: Coordination normal.     Gait: Gait normal.  Psychiatric:        Mood and Affect: Mood normal.        Speech: Speech normal.        Behavior: Behavior normal.        Thought Content: Thought content normal.        Judgment: Judgment normal.     Labs reviewed: Recent Labs    04/03/22 1633 07/10/22 0803 10/05/22 1000  NA 145* 137 142  K 4.6 4.3 4.0  CL 102 100 107  CO2 '27 29 27  '$ GLUCOSE 83 101* 83  BUN 10 11 7*  CREATININE 0.98 0.83 1.34*  CALCIUM 9.6 10.3 9.9   Recent Labs    04/03/22 1633 07/10/22 0803 10/05/22 1000  AST 24 20 42*  ALT '17 10 25  '$ ALKPHOS 80  --  43  BILITOT 0.4 0.5 0.9  PROT 7.6 7.9 8.1  ALBUMIN 4.5  --  4.3   Recent Labs     04/03/22 1633 07/10/22 0803 10/05/22 1000  WBC 6.2 5.1 5.7  NEUTROABS  --  1,984  --   HGB 11.9* 12.8* 12.4*  HCT 37.1* 39.4 40.4  MCV 88 88.7 92.4  PLT 205 252 238   Lab Results  Component Value Date   TSH 0.99 07/10/2022   Lab Results  Component Value Date   HGBA1C 5.4 10/13/2019   Lab Results  Component Value Date   CHOL 229 (H) 07/10/2022   HDL 27 (L) 07/10/2022   [facility]antage  07/10/2022     Comment:     . LDL cholesterol not calculated. Triglyceride levels greater than 400 mg/dL invalidate calculated LDL results. . Reference range: <100 . Desirable range <100 mg/dL for primary prevention;   <70 mg/dL for patients with CHD or diabetic patients  with > or = 2 CHD risk factors. Marland Kitchen LDL-C is now calculated using the Martin-Hopkins  calculation, which is a validated novel method providing  better accuracy than the Friedewald equation in the  estimation of LDL-C.  Cresenciano Genre et al. Annamaria Helling. 0998;338(25): 2061-2068  (http://education.[employer].com/faq/FAQ164)    LDLDIRECT 62.0 10/09/2018   TRIG 576 (H) 07/10/2022   CHOLHDL 8.5 (H) 07/10/2022    Significant Diagnostic Results in last 30 days:  No results found.  Assessment/Plan 1. Essential hypertension B/p elevated this visit but due to pain  Advised to monitor B/p at home then notify provider if > 140/90   2. Pre-operative clearance Scheduled for L 1- S 1 Laminectomy on 10/19/2022 with Neurosurgeon DR.Ostergard Thomas.No clearance form to be filled secured message send to Dr.Ostergard to send clearance form to [facility].Jasmine Dillard,CMA also called Dr.Ostergard's office and left voicemail for staff to fax clearance form. Chart reviewed patient had lab work and EKG done on 10/05/2022 Hgb 12.4 previous 12.8 - Patient advised to Hold Aspirin EC 81 mg Tablet 7 days prior to surgery.  - cleared for surgery from medical stand point.   Family/ staff Communication: Reviewed plan of care with patient and wife verbalized  understanding.   Labs/tests ordered: None   Next Appointment: Return if symptoms worsen or fail to improve.   Sandrea Hughs, NP

## 2022-10-12 ENCOUNTER — Telehealth: Payer: Self-pay | Admitting: *Deleted

## 2022-10-12 NOTE — Telephone Encounter (Signed)
Patient's wife, Verdene Lennert, walked into office and stated that [facility] is requesting the Surgery Clearance OV notes to be faxed to them and to call [employer] #[phone]  I called [facility] and spoke with Maudie Mercury Ref#:[id number] and she stated that the surgeons office needs to call to obtain prior authorization due to Procedure codes.   Called [name] to relay message and told her that usually the Surgeon gets the prior authorization through [employer], we just give the Clearance for the surgery.   Called Dr. Emelda Brothers with [facility] Neurosurgery office [phone] and received the fax number to fax our Surgery Clearance OV notes FAX: [phone]  Patient has Surgery scheduled for 10/19/2022, please complete note to fax.

## 2022-10-13 NOTE — Telephone Encounter (Signed)
OV notes complete.please fax as requested.No surgical clearance form received from Dr.Ostergard's office.Jasmine Dillard,CMA had called and left voicemail and I also send a secured staff message to Dr.Ostergard concerning patient's clearance form for surgery.still awaiting for form.

## 2022-10-13 NOTE — Patient Instructions (Signed)
-  Hold Aspirin EC 81 mg Tablet 7 days prior to surgery.   - cleared for surgery from medical stand point.

## 2022-10-13 NOTE — Telephone Encounter (Signed)
Forwarded Message to Clinical Intake and Dinah's CMA to fax OV Notes due to being out at Facility.

## 2022-10-13 NOTE — Telephone Encounter (Signed)
Note was faxed electronically to Dr.Ostergard, using the route feature in Epic.  Side note: I added Dr.Ostergard to patients care team first and that eased the process of electronically routing/faxing.

## 2022-10-18 ENCOUNTER — Other Ambulatory Visit: Payer: Self-pay

## 2022-10-18 ENCOUNTER — Encounter (HOSPITAL_COMMUNITY): Payer: Self-pay | Admitting: Neurological Surgery

## 2022-10-18 DIAGNOSIS — E782 Mixed hyperlipidemia: Secondary | ICD-10-CM

## 2022-10-18 DIAGNOSIS — I700 Atherosclerosis of aorta: Secondary | ICD-10-CM

## 2022-10-18 DIAGNOSIS — I10 Essential (primary) hypertension: Secondary | ICD-10-CM

## 2022-10-18 NOTE — Progress Notes (Signed)
Patient had a PAT appt on 10/05/22.  Per Blood Bank, the blood bank band is still good for the 10/19/22 surgical procedure.  However, we will to draw a T&S on DOS.  LMOM with instructions for DOS on wife [name]'s VM at [phone].  PCP - Marlowe Sax, FNP-C Cardiologist - none Pulmonology - Dr Baltazar Apo  CT Chest x-ray - 08/09/22 EKG - 10/05/22 Stress Test - n/a ECHO - n/a Cardiac Cath - n/a  ICD Pacemaker/Loop - n/a  Sleep Study -  n/a CPAP - none  Blood Thinner Instructions:  n/a  Aspirin Instructions: Follow your surgeon's instructions on when to stop aspirin prior to surgery,  If no instructions were given by your surgeon then you will need to call the office for those instructions.  ERAS: Clear liquids til 7 am DOS.  Anesthesia review: Janan Ridge, PA reviewed chart on 10/05/22.  STOP now taking any Aspirin (unless otherwise instructed by your surgeon), Aleve, Naproxen, Ibuprofen, Motrin, Advil, Goody's, BC's, all herbal medications, fish oil, and all vitamins.

## 2022-10-19 ENCOUNTER — Inpatient Hospital Stay (HOSPITAL_COMMUNITY): Payer: Medicare Other

## 2022-10-19 ENCOUNTER — Inpatient Hospital Stay (HOSPITAL_COMMUNITY)
Admission: RE | Admit: 2022-10-19 | Discharge: 2022-10-20 | DRG: 460 | Disposition: A | Discharge status: Home / Self Care | Payer: Medicare Other | Attending: Neurological Surgery | Admitting: Neurological Surgery

## 2022-10-19 ENCOUNTER — Other Ambulatory Visit: Payer: Self-pay

## 2022-10-19 ENCOUNTER — Encounter (HOSPITAL_COMMUNITY)
Admission: RE | Disposition: A | Discharge status: Home / Self Care | Payer: Self-pay | Source: Home / Self Care | Attending: Neurological Surgery

## 2022-10-19 ENCOUNTER — Encounter (HOSPITAL_COMMUNITY): Payer: Self-pay | Admitting: Neurological Surgery

## 2022-10-19 ENCOUNTER — Inpatient Hospital Stay (HOSPITAL_COMMUNITY): Payer: Medicare Other | Admitting: Anesthesiology

## 2022-10-19 DIAGNOSIS — J449 Chronic obstructive pulmonary disease, unspecified: Secondary | ICD-10-CM | POA: Diagnosis present

## 2022-10-19 DIAGNOSIS — Z87891 Personal history of nicotine dependence: Secondary | ICD-10-CM | POA: Diagnosis not present

## 2022-10-19 DIAGNOSIS — M5416 Radiculopathy, lumbar region: Secondary | ICD-10-CM | POA: Diagnosis present

## 2022-10-19 DIAGNOSIS — E785 Hyperlipidemia, unspecified: Secondary | ICD-10-CM | POA: Diagnosis present

## 2022-10-19 DIAGNOSIS — J45909 Unspecified asthma, uncomplicated: Secondary | ICD-10-CM | POA: Diagnosis present

## 2022-10-19 DIAGNOSIS — I10 Essential (primary) hypertension: Secondary | ICD-10-CM | POA: Diagnosis not present

## 2022-10-19 DIAGNOSIS — Z8673 Personal history of transient ischemic attack (TIA), and cerebral infarction without residual deficits: Secondary | ICD-10-CM

## 2022-10-19 DIAGNOSIS — M48062 Spinal stenosis, lumbar region with neurogenic claudication: Secondary | ICD-10-CM | POA: Diagnosis not present

## 2022-10-19 DIAGNOSIS — M4316 Spondylolisthesis, lumbar region: Secondary | ICD-10-CM | POA: Diagnosis present

## 2022-10-19 DIAGNOSIS — M5136 Other intervertebral disc degeneration, lumbar region: Secondary | ICD-10-CM | POA: Diagnosis not present

## 2022-10-19 DIAGNOSIS — Z833 Family history of diabetes mellitus: Secondary | ICD-10-CM | POA: Diagnosis not present

## 2022-10-19 DIAGNOSIS — M48061 Spinal stenosis, lumbar region without neurogenic claudication: Secondary | ICD-10-CM | POA: Diagnosis not present

## 2022-10-19 DIAGNOSIS — M4807 Spinal stenosis, lumbosacral region: Secondary | ICD-10-CM | POA: Diagnosis not present

## 2022-10-19 DIAGNOSIS — Z981 Arthrodesis status: Secondary | ICD-10-CM | POA: Diagnosis not present

## 2022-10-19 HISTORY — PX: LAMINECTOMY WITH POSTERIOR LATERAL ARTHRODESIS LEVEL 4: SHX6338

## 2022-10-19 LAB — TYPE AND SCREEN
ABO/RH(D): A POS
Antibody Screen: NEGATIVE

## 2022-10-19 SURGERY — LAMINECTOMY WITH POSTERIOR LATERAL ARTHRODESIS LEVEL 4
Anesthesia: General

## 2022-10-19 MED ORDER — HYDROMORPHONE HCL 1 MG/ML IJ SOLN
INTRAMUSCULAR | Status: DC | PRN
  Administered 2022-10-19 (×2): .25 mg via INTRAVENOUS

## 2022-10-19 MED ORDER — THROMBIN 5000 UNITS EX SOLR
OROMUCOSAL | Status: DC | PRN
  Administered 2022-10-19: 5 mL via TOPICAL

## 2022-10-19 MED ORDER — FENTANYL CITRATE (PF) 250 MCG/5ML IJ SOLN
INTRAMUSCULAR | Status: DC | PRN
  Administered 2022-10-19: 100 ug via INTRAVENOUS
  Administered 2022-10-19: 50 ug via INTRAVENOUS

## 2022-10-19 MED ORDER — HYDROMORPHONE HCL 1 MG/ML IJ SOLN
1.0000 mg | INTRAMUSCULAR | Status: DC | PRN
  Administered 2022-10-19: 1 mg via INTRAVENOUS
  Filled 2022-10-19: qty 1

## 2022-10-19 MED ORDER — SODIUM CHLORIDE 0.9% FLUSH: 3.0000 mL | Freq: Two times a day (BID) | INTRAVENOUS | Status: DC

## 2022-10-19 MED ORDER — ONDANSETRON HCL 4 MG/2ML IJ SOLN
INTRAMUSCULAR | Status: AC
  Filled 2022-10-19: qty 2

## 2022-10-19 MED ORDER — PROPOFOL 10 MG/ML IV BOLUS
INTRAVENOUS | Status: AC
  Filled 2022-10-19: qty 20

## 2022-10-19 MED ORDER — OXYCODONE HCL 5 MG PO TABS: 5.0000 mg | ORAL_TABLET | Freq: Once | ORAL | Status: DC | PRN

## 2022-10-19 MED ORDER — PROPOFOL 10 MG/ML IV BOLUS
INTRAVENOUS | Status: DC | PRN
  Administered 2022-10-19: 120 mg via INTRAVENOUS

## 2022-10-19 MED ORDER — LIDOCAINE-EPINEPHRINE 1 %-1:100000 IJ SOLN
INTRAMUSCULAR | Status: AC
  Filled 2022-10-19: qty 1

## 2022-10-19 MED ORDER — OXYCODONE HCL 5 MG PO TABS
5.0000 mg | ORAL_TABLET | ORAL | Status: DC | PRN
  Administered 2022-10-19 (×2): 5 mg via ORAL
  Filled 2022-10-19 (×2): qty 1

## 2022-10-19 MED ORDER — LIDOCAINE 2% (20 MG/ML) 5 ML SYRINGE
INTRAMUSCULAR | Status: DC | PRN
  Administered 2022-10-19: 50 mg via INTRAVENOUS

## 2022-10-19 MED ORDER — MEPERIDINE HCL 25 MG/ML IJ SOLN: 6.2500 mg | INTRAMUSCULAR | Status: DC | PRN

## 2022-10-19 MED ORDER — OXYCODONE HCL 5 MG/5ML PO SOLN: 5.0000 mg | Freq: Once | ORAL | Status: DC | PRN

## 2022-10-19 MED ORDER — PROMETHAZINE HCL 25 MG/ML IJ SOLN: 6.2500 mg | INTRAMUSCULAR | Status: DC | PRN

## 2022-10-19 MED ORDER — SODIUM CHLORIDE 0.9% FLUSH: 3.0000 mL | INTRAVENOUS | Status: DC | PRN

## 2022-10-19 MED ORDER — ASPIRIN 81 MG PO TBEC: 81.0000 mg | DELAYED_RELEASE_TABLET | Freq: Every day | ORAL | Status: DC

## 2022-10-19 MED ORDER — AMLODIPINE BESYLATE 5 MG PO TABS
5.0000 mg | ORAL_TABLET | Freq: Every day | ORAL | Status: DC
  Administered 2022-10-20: 5 mg via ORAL
  Filled 2022-10-19: qty 1

## 2022-10-19 MED ORDER — DOCUSATE SODIUM 100 MG PO CAPS
100.0000 mg | ORAL_CAPSULE | Freq: Two times a day (BID) | ORAL | Status: DC
  Administered 2022-10-19 – 2022-10-20 (×2): 100 mg via ORAL
  Filled 2022-10-19 (×2): qty 1

## 2022-10-19 MED ORDER — ROSUVASTATIN CALCIUM 20 MG PO TABS
20.0000 mg | ORAL_TABLET | Freq: Every day | ORAL | Status: DC
  Administered 2022-10-19 – 2022-10-20 (×2): 20 mg via ORAL
  Filled 2022-10-19 (×2): qty 1

## 2022-10-19 MED ORDER — CEFAZOLIN SODIUM-DEXTROSE 2-4 GM/100ML-% IV SOLN
2.0000 g | Freq: Once | INTRAVENOUS | Status: AC
  Administered 2022-10-19 (×2): 2 g via INTRAVENOUS
  Filled 2022-10-19: qty 100

## 2022-10-19 MED ORDER — DEXAMETHASONE SODIUM PHOSPHATE 10 MG/ML IJ SOLN
INTRAMUSCULAR | Status: AC
  Filled 2022-10-19: qty 1

## 2022-10-19 MED ORDER — OXYCODONE HCL 5 MG PO TABS
10.0000 mg | ORAL_TABLET | ORAL | Status: DC | PRN
  Administered 2022-10-20 (×3): 10 mg via ORAL
  Filled 2022-10-19 (×4): qty 2

## 2022-10-19 MED ORDER — ALBUTEROL SULFATE HFA 108 (90 BASE) MCG/ACT IN AERS
INHALATION_SPRAY | RESPIRATORY_TRACT | Status: DC | PRN
  Administered 2022-10-19: 6 via RESPIRATORY_TRACT

## 2022-10-19 MED ORDER — BUPIVACAINE HCL (PF) 0.5 % IJ SOLN
INTRAMUSCULAR | Status: AC
  Filled 2022-10-19: qty 30

## 2022-10-19 MED ORDER — THROMBIN 5000 UNITS EX SOLR
CUTANEOUS | Status: AC
  Filled 2022-10-19: qty 10000

## 2022-10-19 MED ORDER — LIDOCAINE 2% (20 MG/ML) 5 ML SYRINGE
INTRAMUSCULAR | Status: AC
  Filled 2022-10-19: qty 5

## 2022-10-19 MED ORDER — HYDROMORPHONE HCL 1 MG/ML IJ SOLN
INTRAMUSCULAR | Status: AC
  Filled 2022-10-19: qty 0.5

## 2022-10-19 MED ORDER — DEXAMETHASONE SODIUM PHOSPHATE 10 MG/ML IJ SOLN
INTRAMUSCULAR | Status: DC | PRN
  Administered 2022-10-19: 5 mg via INTRAVENOUS

## 2022-10-19 MED ORDER — SUGAMMADEX SODIUM 200 MG/2ML IV SOLN
INTRAVENOUS | Status: DC | PRN
  Administered 2022-10-19: 200 mg via INTRAVENOUS

## 2022-10-19 MED ORDER — ONDANSETRON HCL 4 MG/2ML IJ SOLN
INTRAMUSCULAR | Status: DC | PRN
  Administered 2022-10-19: 4 mg via INTRAVENOUS

## 2022-10-19 MED ORDER — ACETAMINOPHEN 325 MG PO TABS
650.0000 mg | ORAL_TABLET | ORAL | Status: DC | PRN
  Administered 2022-10-19 – 2022-10-20 (×3): 650 mg via ORAL
  Filled 2022-10-19 (×3): qty 2

## 2022-10-19 MED ORDER — ORAL CARE MOUTH RINSE: 15.0000 mL | Freq: Once | OROMUCOSAL | Status: AC

## 2022-10-19 MED ORDER — FENTANYL CITRATE (PF) 250 MCG/5ML IJ SOLN
INTRAMUSCULAR | Status: AC
  Filled 2022-10-19: qty 5

## 2022-10-19 MED ORDER — METHOCARBAMOL 500 MG PO TABS
500.0000 mg | ORAL_TABLET | Freq: Four times a day (QID) | ORAL | Status: DC
  Administered 2022-10-19 – 2022-10-20 (×3): 500 mg via ORAL
  Filled 2022-10-19 (×3): qty 1

## 2022-10-19 MED ORDER — POLYETHYLENE GLYCOL 3350 17 G PO PACK: 17.0000 g | PACK | Freq: Every day | ORAL | Status: DC | PRN

## 2022-10-19 MED ORDER — MIDAZOLAM HCL 2 MG/2ML IJ SOLN
INTRAMUSCULAR | Status: AC
  Filled 2022-10-19: qty 2

## 2022-10-19 MED ORDER — SODIUM CHLORIDE 0.9 % IV SOLN
250.0000 mL | INTRAVENOUS | Status: DC
  Administered 2022-10-19: 250 mL via INTRAVENOUS

## 2022-10-19 MED ORDER — MENTHOL 3 MG MT LOZG: 1.0000 | LOZENGE | OROMUCOSAL | Status: DC | PRN

## 2022-10-19 MED ORDER — HYDROCHLOROTHIAZIDE 25 MG PO TABS
25.0000 mg | ORAL_TABLET | Freq: Every day | ORAL | Status: DC
  Administered 2022-10-19 – 2022-10-20 (×2): 25 mg via ORAL
  Filled 2022-10-19 (×2): qty 1

## 2022-10-19 MED ORDER — PHENOL 1.4 % MT LIQD: 1.0000 | OROMUCOSAL | Status: DC | PRN

## 2022-10-19 MED ORDER — 0.9 % SODIUM CHLORIDE (POUR BTL) OPTIME
TOPICAL | Status: DC | PRN
  Administered 2022-10-19: 1000 mL

## 2022-10-19 MED ORDER — ROCURONIUM BROMIDE 10 MG/ML (PF) SYRINGE
PREFILLED_SYRINGE | INTRAVENOUS | Status: AC
  Filled 2022-10-19: qty 10

## 2022-10-19 MED ORDER — ROCURONIUM BROMIDE 10 MG/ML (PF) SYRINGE
PREFILLED_SYRINGE | INTRAVENOUS | Status: AC
  Filled 2022-10-19: qty 30

## 2022-10-19 MED ORDER — ACETAMINOPHEN 500 MG PO TABS
1000.0000 mg | ORAL_TABLET | Freq: Once | ORAL | Status: AC
  Administered 2022-10-19: 1000 mg via ORAL
  Filled 2022-10-19: qty 2

## 2022-10-19 MED ORDER — ONDANSETRON HCL 4 MG/2ML IJ SOLN: 4.0000 mg | Freq: Four times a day (QID) | INTRAMUSCULAR | Status: DC | PRN

## 2022-10-19 MED ORDER — FLUTICASONE FUROATE-VILANTEROL 100-25 MCG/ACT IN AEPB
1.0000 | INHALATION_SPRAY | Freq: Every day | RESPIRATORY_TRACT | Status: DC
  Filled 2022-10-19: qty 28

## 2022-10-19 MED ORDER — MIDAZOLAM HCL 2 MG/2ML IJ SOLN
INTRAMUSCULAR | Status: DC | PRN
  Administered 2022-10-19: 2 mg via INTRAVENOUS

## 2022-10-19 MED ORDER — ROCURONIUM BROMIDE 10 MG/ML (PF) SYRINGE
PREFILLED_SYRINGE | INTRAVENOUS | Status: DC | PRN
  Administered 2022-10-19: 60 mg via INTRAVENOUS
  Administered 2022-10-19: 20 mg via INTRAVENOUS
  Administered 2022-10-19: 40 mg via INTRAVENOUS
  Administered 2022-10-19: 20 mg via INTRAVENOUS
  Administered 2022-10-19: 30 mg via INTRAVENOUS

## 2022-10-19 MED ORDER — PHENYLEPHRINE HCL-NACL 20-0.9 MG/250ML-% IV SOLN
INTRAVENOUS | Status: DC | PRN
  Administered 2022-10-19: 80 ug via INTRAVENOUS
  Administered 2022-10-19: 50 ug/min via INTRAVENOUS
  Administered 2022-10-19: 80 ug via INTRAVENOUS

## 2022-10-19 MED ORDER — CYCLOBENZAPRINE HCL 10 MG PO TABS: 10.0000 mg | ORAL_TABLET | Freq: Three times a day (TID) | ORAL | Status: DC | PRN

## 2022-10-19 MED ORDER — ALBUTEROL SULFATE HFA 108 (90 BASE) MCG/ACT IN AERS: 2.0000 | INHALATION_SPRAY | Freq: Four times a day (QID) | RESPIRATORY_TRACT | Status: DC | PRN

## 2022-10-19 MED ORDER — LIDOCAINE-EPINEPHRINE 1 %-1:100000 IJ SOLN
INTRAMUSCULAR | Status: DC | PRN
  Administered 2022-10-19: 5 mL

## 2022-10-19 MED ORDER — TAMSULOSIN HCL 0.4 MG PO CAPS
0.4000 mg | ORAL_CAPSULE | Freq: Every day | ORAL | Status: DC
  Administered 2022-10-19 – 2022-10-20 (×2): 0.4 mg via ORAL
  Filled 2022-10-19 (×2): qty 1

## 2022-10-19 MED ORDER — ACETAMINOPHEN 650 MG RE SUPP: 650.0000 mg | RECTAL | Status: DC | PRN

## 2022-10-19 MED ORDER — PHENYLEPHRINE 80 MCG/ML (10ML) SYRINGE FOR IV PUSH (FOR BLOOD PRESSURE SUPPORT)
PREFILLED_SYRINGE | INTRAVENOUS | Status: AC
  Filled 2022-10-19: qty 10

## 2022-10-19 MED ORDER — MIDAZOLAM HCL 2 MG/2ML IJ SOLN: 0.5000 mg | Freq: Once | INTRAMUSCULAR | Status: DC | PRN

## 2022-10-19 MED ORDER — FENOFIBRATE 160 MG PO TABS
160.0000 mg | ORAL_TABLET | Freq: Every day | ORAL | Status: DC
  Administered 2022-10-19 – 2022-10-20 (×2): 160 mg via ORAL
  Filled 2022-10-19 (×2): qty 1

## 2022-10-19 MED ORDER — LACTATED RINGERS IV SOLN: INTRAVENOUS | Status: DC

## 2022-10-19 MED ORDER — ONDANSETRON HCL 4 MG PO TABS: 4.0000 mg | ORAL_TABLET | Freq: Four times a day (QID) | ORAL | Status: DC | PRN

## 2022-10-19 MED ORDER — HYDROMORPHONE HCL 1 MG/ML IJ SOLN
INTRAMUSCULAR | Status: AC
  Filled 2022-10-19: qty 1

## 2022-10-19 MED ORDER — CEFAZOLIN SODIUM-DEXTROSE 2-4 GM/100ML-% IV SOLN
2.0000 g | Freq: Three times a day (TID) | INTRAVENOUS | Status: AC
  Administered 2022-10-19 – 2022-10-20 (×2): 2 g via INTRAVENOUS
  Filled 2022-10-19 (×2): qty 100

## 2022-10-19 MED ORDER — FINASTERIDE 5 MG PO TABS
5.0000 mg | ORAL_TABLET | Freq: Every day | ORAL | Status: DC
  Administered 2022-10-19 – 2022-10-20 (×2): 5 mg via ORAL
  Filled 2022-10-19 (×2): qty 1

## 2022-10-19 MED ORDER — HYDROMORPHONE HCL 1 MG/ML IJ SOLN
0.2500 mg | INTRAMUSCULAR | Status: DC | PRN
  Administered 2022-10-19 (×2): 0.5 mg via INTRAVENOUS

## 2022-10-19 MED ORDER — CHLORHEXIDINE GLUCONATE 0.12 % MT SOLN
15.0000 mL | Freq: Once | OROMUCOSAL | Status: AC
  Administered 2022-10-19: 15 mL via OROMUCOSAL
  Filled 2022-10-19: qty 15

## 2022-10-19 MED ORDER — BUPIVACAINE HCL (PF) 0.5 % IJ SOLN
INTRAMUSCULAR | Status: DC | PRN
  Administered 2022-10-19: 5 mL

## 2022-10-19 SURGICAL SUPPLY — 77 items
ADH SKN CLS APL DERMABOND .7 (GAUZE/BANDAGES/DRESSINGS) ×1
APL SKNCLS STERI-STRIP NONHPOA (GAUZE/BANDAGES/DRESSINGS)
BAG COUNTER SPONGE SURGICOUNT (BAG) ×1 IMPLANT
BAG SPNG CNTER NS LX DISP (BAG) ×1
BASKET BONE COLLECTION (BASKET) ×1 IMPLANT
BENZOIN TINCTURE PRP APPL 2/3 (GAUZE/BANDAGES/DRESSINGS) IMPLANT
BLADE BONE MILL MEDIUM (MISCELLANEOUS) IMPLANT
BLADE CLIPPER SURG (BLADE) IMPLANT
BLADE SURG 11 STRL SS (BLADE) ×1 IMPLANT
BUR 14 MATCH 3 (BUR) IMPLANT
BUR MATCHSTICK NEURO 3.0 LAGG (BURR) ×1 IMPLANT
BUR MR8 14CM BALL SYMTRI 5 (BUR) IMPLANT
BUR PRECISION FLUTE 5.0 (BURR) ×1 IMPLANT
BURR 14 MATCH 3 (BUR) ×1
BURR MR8 14CM BALL SYMTRI 5 (BUR) ×2
CANISTER SUCT 3000ML PPV (MISCELLANEOUS) ×1 IMPLANT
CNTNR URN SCR LID CUP LEK RST (MISCELLANEOUS) ×1 IMPLANT
CONT SPEC 4OZ STRL OR WHT (MISCELLANEOUS) ×1
COVER BACK TABLE 60X90IN (DRAPES) ×1 IMPLANT
COVERAGE SUPPORT O-ARM STEALTH (MISCELLANEOUS) ×1 IMPLANT
DERMABOND ADVANCED .7 DNX12 (GAUZE/BANDAGES/DRESSINGS) ×1 IMPLANT
DRAPE C-ARM 42X72 X-RAY (DRAPES) IMPLANT
DRAPE C-ARMOR (DRAPES) IMPLANT
DRAPE LAPAROTOMY 100X72X124 (DRAPES) ×1 IMPLANT
DRAPE SHEET LG 3/4 BI-LAMINATE (DRAPES) ×4 IMPLANT
DRAPE SURG 17X23 STRL (DRAPES) ×1 IMPLANT
DURAPREP 26ML APPLICATOR (WOUND CARE) ×1 IMPLANT
ELECT REM PT RETURN 9FT ADLT (ELECTROSURGICAL) ×1
ELECTRODE REM PT RTRN 9FT ADLT (ELECTROSURGICAL) ×1 IMPLANT
FEE COVERAGE SUPPORT O-ARM (MISCELLANEOUS) ×1 IMPLANT
GAUZE 4X4 16PLY ~~LOC~~+RFID DBL (SPONGE) IMPLANT
GAUZE SPONGE 4X4 12PLY STRL (GAUZE/BANDAGES/DRESSINGS) IMPLANT
GLOVE BIOGEL PI IND STRL 7.5 (GLOVE) ×2 IMPLANT
GLOVE ECLIPSE 7.5 STRL STRAW (GLOVE) ×2 IMPLANT
GLOVE EXAM NITRILE LRG STRL (GLOVE) IMPLANT
GLOVE EXAM NITRILE XL STR (GLOVE) IMPLANT
GLOVE EXAM NITRILE XS STR PU (GLOVE) IMPLANT
GOWN STRL REUS W/ TWL LRG LVL3 (GOWN DISPOSABLE) ×4 IMPLANT
GOWN STRL REUS W/ TWL XL LVL3 (GOWN DISPOSABLE) IMPLANT
GOWN STRL REUS W/TWL 2XL LVL3 (GOWN DISPOSABLE) IMPLANT
GOWN STRL REUS W/TWL LRG LVL3 (GOWN DISPOSABLE) ×4
GOWN STRL REUS W/TWL XL LVL3 (GOWN DISPOSABLE) ×3
HEMOSTAT POWDER KIT SURGIFOAM (HEMOSTASIS) ×1 IMPLANT
KIT BASIN OR (CUSTOM PROCEDURE TRAY) ×1 IMPLANT
KIT INFUSE MEDIUM (Orthopedic Implant) IMPLANT
KIT POSITION SURG JACKSON T1 (MISCELLANEOUS) ×1 IMPLANT
KIT TURNOVER KIT B (KITS) ×1 IMPLANT
MARKER SPHERE PSV REFLC NDI (MISCELLANEOUS) ×5 IMPLANT
MILL BONE PREP (MISCELLANEOUS) ×1 IMPLANT
NDL HYPO 18GX1.5 BLUNT FILL (NEEDLE) IMPLANT
NDL SPNL 18GX3.5 QUINCKE PK (NEEDLE) IMPLANT
NEEDLE HYPO 18GX1.5 BLUNT FILL (NEEDLE) IMPLANT
NEEDLE HYPO 22GX1.5 SAFETY (NEEDLE) ×1 IMPLANT
NEEDLE SPNL 18GX3.5 QUINCKE PK (NEEDLE) IMPLANT
NS IRRIG 1000ML POUR BTL (IV SOLUTION) ×1 IMPLANT
PACK LAMINECTOMY NEURO (CUSTOM PROCEDURE TRAY) ×1 IMPLANT
PAD ARMBOARD 7.5X6 YLW CONV (MISCELLANEOUS) ×3 IMPLANT
ROD SPINAL TI CP4 NS 5.5X120 (Rod) IMPLANT
SCREW MAS HEAD SINGLE (Screw) ×1 IMPLANT
SCREW MAS/SET HEAD FOR 5.5 ROD (Screw) IMPLANT
SCREW MAS/SET STERILE 4PK (Screw) IMPLANT
SCREW SET MA STRL LF SS CD HZN (Screw) IMPLANT
SCREW SHANK NL TITAN 6.5X45 (Screw) IMPLANT
SCREW SHANK NL TITAN 7.5X45 (Screw) IMPLANT
SPIKE FLUID TRANSFER (MISCELLANEOUS) ×1 IMPLANT
SPONGE SURGIFOAM ABS GEL 100 (HEMOSTASIS) IMPLANT
SPONGE T-LAP 4X18 ~~LOC~~+RFID (SPONGE) IMPLANT
STRIP CLOSURE SKIN 1/2X4 (GAUZE/BANDAGES/DRESSINGS) IMPLANT
SUT MNCRL AB 3-0 PS2 18 (SUTURE) ×1 IMPLANT
SUT VIC AB 0 CT1 18XCR BRD8 (SUTURE) ×1 IMPLANT
SUT VIC AB 0 CT1 8-18 (SUTURE) ×1
SUT VIC AB 2-0 CP2 18 (SUTURE) ×1 IMPLANT
SYR 3ML LL SCALE MARK (SYRINGE) IMPLANT
TOWEL GREEN STERILE (TOWEL DISPOSABLE) ×1 IMPLANT
TOWEL GREEN STERILE FF (TOWEL DISPOSABLE) ×1 IMPLANT
TRAY FOLEY MTR SLVR 16FR STAT (SET/KITS/TRAYS/PACK) ×1 IMPLANT
WATER STERILE IRR 1000ML POUR (IV SOLUTION) ×1 IMPLANT

## 2022-10-19 NOTE — H&P (Signed)
Surgical H&P Update  HPI: 72 y.o. 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 No changes in health since they were last seen. Still having the above and wishes to proceed with surgery.  PMHx:  Past Medical History:  Diagnosis Date   Arthritis    Asthma    Hepatitis    treated for Hep C in the past   History of substance abuse ([place])    cocaine- over 20 years ago   Hyperlipidemia    Hypertension    Stroke ([place])    Vertigo    FamHx:  Family History  Problem Relation Age of Onset   Diabetes Father    Colon cancer Neg Hx    Colon polyps Neg Hx    Esophageal cancer Neg Hx    Stomach cancer Neg Hx    Rectal cancer Neg Hx    SocHx:  reports that he has quit smoking. His smoking use included cigarettes. He smoked an average of .2 packs per day. He has never used smokeless tobacco. He reports that he does not currently use drugs after having used the following drugs: Cocaine. He reports that he does not drink alcohol.  Physical Exam: Strength 5/5 x4 and SILTx4 except R foot numbness  Assesment/Plan: 72 y.o. man with low back and neurogenic claudication, here for L1-S1 decompression and PSIF. Risks, benefits, and alternatives discussed and the patient would like to continue with surgery.  -OR today -floor post-op  Judith Part, MD 10/19/22 7:49 AM

## 2022-10-19 NOTE — Progress Notes (Signed)
Orthopedic Tech Progress Note Patient Details:  Jeanette Moffatt 1951/04/10 [id number]   TLSO adjusted and at bedside for use when ambulating.   Ortho Devices Type of Ortho Device: Thoracolumbar corset (TLSO) Ortho Device/Splint Location: adjusted and at bedside Ortho Device/Splint Interventions: Ordered, Adjustment   Post Interventions Patient Tolerated: Well Instructions Provided: Care of device, Adjustment of device  Mayola Mcbain Jeri Modena 10/19/2022, 8:38 PM

## 2022-10-19 NOTE — Transfer of Care (Signed)
Immediate Anesthesia Transfer of Care Note  Patient: Todd Mendoza  Procedure(s) Performed: Lumbar one to Sacral one Laminectomies with Lumbar one to Sacral one Posterolateral instrumented fusion Application of O-Arm  Patient Location: PACU  Anesthesia Type:General  Level of Consciousness: awake and patient cooperative  Airway & Oxygen Therapy: Patient Spontanous Breathing and Patient connected to face mask oxygen  Post-op Assessment: Report given to RN, Post -op Vital signs reviewed and stable, and Patient moving all extremities  Post vital signs: Reviewed and stable  Last Vitals:  Vitals Value Taken Time  BP 123/85 10/19/22 1602  Temp    Pulse 71 10/19/22 1605  Resp 8 10/19/22 1605  SpO2 100 % 10/19/22 1605  Vitals shown include unvalidated device data.  Last Pain:  Vitals:   10/19/22 0757  TempSrc:   PainSc: 0-No pain      Patients Stated Pain Goal: 0 (29/09/03 0149)  Complications: No notable events documented.

## 2022-10-19 NOTE — Op Note (Signed)
PATIENT: Todd Mendoza  DAY OF SURGERY: 10/19/22   PRE-OPERATIVE DIAGNOSIS:  Lumbar stenosis with neurogenic claudication, mobile lumbar spondylolisthesis   POST-OPERATIVE DIAGNOSIS:  Same   PROCEDURE:  L1-L5 laminectomies, L1-2/L2-3/L3-4/L4-5/L5-S1 posterolateral instrumented fusion   SURGEON:  Surgeon(s) and Role:    Gerrald Basu, Joyice Faster, MD - Primary    Norm Parcel PA  - Assisting   ANESTHESIA: ETGA   BRIEF HISTORY: This is a 72 year old who presented with bilateral lower extremity claudication and low back pain. Workup showed severe central canal stenosis throughout the lumbar spine with multiple level mobile spondylolistheses. His symptoms were refractory, I therefore recommended surgery. This was discussed with the patient as well as risks, benefits, and alternatives and wished to proceed with surgery.   OPERATIVE DETAIL: The patient was taken to the operating room and anesthesia was induced by the anesthesia team. They were placed on the OR table in the prone position with padding of all pressure points. A formal time out was performed with two patient identifiers and confirmed the operative site. The operative site was marked, hair was clipped with surgical clippers, the area was then prepped and draped in a sterile fashion. Fluoro was used to localize the operative level and a midline incision was placed to expose from L1 to S1. Subperiosteal dissection was performed bilaterally and fluoroscopy was again used to confirm the surgical level.   A spinous process clamp was applied and secured, followed by attachment of a reference array. The field was draped and the O-arm was brought into the field. An intra-op CT was performed, sent to the Stealth navigation station, registered to the patient's anatomy, and confirmed with landmarks with acceptable fit. Stereotactic spinal navigation was utilized throughout the procedure for planning and placement of pedicle screw  trajectories.  Instrumentation was then performed. Fluoroscopy was used to guide placement of bilateral pedicle screws ([employer]tr[employer]) at L1, L2, L3, L4, L5, and S1. These were placed with navigated instruments by localizing the pedicle, drilling a pilot hole, cannulating the pedicle with an awl-tap, palpating for pedicle wall breaches, and then placing the screw.   Decompression was performed, which consisted of decompressive laminectomies at L1-2, L2-3, L3-4, L4-5, and L5-S1. These were performed with a combination of high speed drill and rongeurs. As expected, his facets were impressively hypertrophic and the central canal stenosis was severe at multiple levels.   The pedicle screws were connected with rods bilaterally. The left L3 screw head was difficult to integrate into the construct and, as opposed to weakening the rod with multiple points of bending moments, I removed the left L3 pedicle screw and otherwise spanned L1 to S1. The screw heads were placed, the rod was placed, and the cap heads were final tightened according to manufacturer torque specifications. The bone was thoroughly decorticated over the fusion surface and the previously resected bone fragments were morselized and used as autograft with the addition of rBMP along the posterolateral fusion surfaces.   All instrument and sponge counts were correct, the incision was then closed in layers. The patient was then returned to anesthesia for emergence. No apparent complications at the completion of the procedure.   EBL:  644m, 2557mgiven back via cell saver   DRAINS: none   SPECIMENS: none   ThJudith PartMD 10/19/22 3:40 PM

## 2022-10-19 NOTE — Anesthesia Preprocedure Evaluation (Addendum)
Anesthesia Evaluation  Patient identified by MRN, date of birth, ID band Patient awake    Reviewed: Allergy & Precautions, NPO status , Patient's Chart, lab work & pertinent test results  History of Anesthesia Complications Negative for: history of anesthetic complications  Airway Mallampati: I  TM Distance: >3 FB Neck ROM: Full    Dental  (+) Edentulous Upper, Edentulous Lower   Pulmonary asthma , COPD (daily inhaler),  COPD inhaler, former smoker   breath sounds clear to auscultation       Cardiovascular hypertension, Pt. on medications (-) angina  Rhythm:Regular Rate:Normal     Neuro/Psych  Headaches CVA, No Residual Symptoms    GI/Hepatic negative GI ROS, Neg liver ROS,,,  Endo/Other  BMI 30  Renal/GU negative Renal ROS     Musculoskeletal   Abdominal   Peds  Hematology negative hematology ROS (+)   Anesthesia Other Findings   Reproductive/Obstetrics                             Anesthesia Physical Anesthesia Plan  ASA: 3  Anesthesia Plan: General   Post-op Pain Management: Tylenol PO (pre-op)*   Induction: Intravenous  PONV Risk Score and Plan: 2 and Ondansetron and Dexamethasone  Airway Management Planned: Oral ETT  Additional Equipment: None  Intra-op Plan:   Post-operative Plan: Extubation in OR  Informed Consent: I have reviewed the patients History and Physical, chart, labs and discussed the procedure including the risks, benefits and alternatives for the proposed anesthesia with the patient or authorized representative who has indicated his/her understanding and acceptance.       Plan Discussed with: CRNA and Surgeon  Anesthesia Plan Comments:         Anesthesia Quick Evaluation

## 2022-10-19 NOTE — Anesthesia Postprocedure Evaluation (Signed)
Anesthesia Post Note  Patient: Todd Mendoza  Procedure(s) Performed: Lumbar one to Sacral one Laminectomies with Lumbar one to Sacral one Posterolateral instrumented fusion Application of O-Arm     Patient location during evaluation: PACU Anesthesia Type: General Level of consciousness: awake and alert, patient cooperative and oriented Pain management: pain level controlled Vital Signs Assessment: post-procedure vital signs reviewed and stable Respiratory status: spontaneous breathing, nonlabored ventilation and respiratory function stable Cardiovascular status: blood pressure returned to baseline and stable Postop Assessment: no apparent nausea or vomiting Anesthetic complications: no   No notable events documented.  Last Vitals:  Vitals:   10/19/22 1730 10/19/22 1800  BP:  112/87  Pulse:  70  Resp: 17 18  Temp:  36.5 C  SpO2:  93%    Last Pain:  Vitals:   10/19/22 1800  TempSrc: Oral  PainSc:                  Yasemin Rabon,E. Allana Shrestha

## 2022-10-19 NOTE — Anesthesia Procedure Notes (Signed)
Procedure Name: Intubation Date/Time: 10/19/2022 10:59 AM  Performed by: Elvin So, CRNAPre-anesthesia Checklist: Patient identified, Emergency Drugs available, Suction available and Patient being monitored Patient Re-evaluated:Patient Re-evaluated prior to induction Oxygen Delivery Method: Circle System Utilized Preoxygenation: Pre-oxygenation with 100% oxygen Induction Type: IV induction Ventilation: Mask ventilation without difficulty Laryngoscope Size: Mac and 4 Grade View: Grade I Tube type: Oral Tube size: 7.5 mm Number of attempts: 1 Airway Equipment and Method: Stylet and Oral airway Placement Confirmation: ETT inserted through vocal cords under direct vision, positive ETCO2 and breath sounds checked- equal and bilateral Secured at: 22 cm Tube secured with: Tape Dental Injury: Teeth and Oropharynx as per pre-operative assessment

## 2022-10-20 MED ORDER — OXYCODONE HCL 5 MG PO TABS: 5.0000 mg | ORAL_TABLET | ORAL | 0 refills | Status: DC | PRN

## 2022-10-20 NOTE — Plan of Care (Signed)
  Problem: Education: Goal: Ability to verbalize activity precautions or restrictions will improve Outcome: Completed/Met Goal: Knowledge of the prescribed therapeutic regimen will improve Outcome: Completed/Met Goal: Understanding of discharge needs will improve Outcome: Completed/Met   Problem: Activity: Goal: Ability to avoid complications of mobility impairment will improve Outcome: Completed/Met Goal: Ability to tolerate increased activity will improve Outcome: Completed/Met Goal: Will remain free from falls Outcome: Completed/Met   Problem: Bowel/Gastric: Goal: Gastrointestinal status for postoperative course will improve Outcome: Completed/Met   Problem: Clinical Measurements: Goal: Ability to maintain clinical measurements within normal limits will improve Outcome: Completed/Met Goal: Postoperative complications will be avoided or minimized Outcome: Completed/Met Goal: Diagnostic test results will improve Outcome: Completed/Met   Problem: Pain Management: Goal: Pain level will decrease Outcome: Completed/Met   Problem: Skin Integrity: Goal: Will show signs of wound healing Outcome: Completed/Met   Problem: Health Behavior/Discharge Planning: Goal: Identification of resources available to assist in meeting health care needs will improve Outcome: Completed/Met   Problem: Bladder/Genitourinary: Goal: Urinary functional status for postoperative course will improve Outcome: Completed/Met Patient alert and oriented, void, ambulate.  Scant drainage, surgical dressing changed. D/c instructions explain and given to the patient all questions answered. Will d/c patient home per order.

## 2022-10-20 NOTE — Discharge Summary (Signed)
Discharge Summary  Date of Admission: 10/19/2022  Date of Discharge: 10/20/22  Attending Physician: Emelda Brothers, MD  Hospital Course: Patient was admitted following an uncomplicated V9-T6 decompression and PSIF. They were recovered in PACU and transferred to [facility]. Their preop symptoms were improved, their hospital course was uncomplicated and the patient was discharged home on POD1. They will follow up in clinic with me in clinic in 2 weeks.  Neurologic exam at discharge:  Strength 5/5 x4 and SILTx4   Discharge diagnosis: Lumbar stenosis with neurogenic claudication, lumbar spondylolisthesis  Judith Part, MD 10/20/22 9:00 AM

## 2022-10-20 NOTE — Progress Notes (Signed)
Neurosurgery Service Progress Note  Subjective: No acute events overnight, feels great - claudication gone, back pain is manageable, looks great for a large lumbar surgery   Objective: Vitals:   10/19/22 2045 10/19/22 2344 10/20/22 0441 10/20/22 0729  BP: (!) 138/99 121/75 (!) 122/90 (!) 143/95  Pulse: 84 77 77 94  Resp: '20 18 18 19  '$ Temp: 98.5 F (36.9 C) 98.5 F (36.9 C) 99.2 F (37.3 C) 100 F (37.8 C)  TempSrc: Oral Oral Oral Oral  SpO2: 97% 96% 98% 97%  Weight:      Height:        Physical Exam: Strength 5/5 x4 and SILTx4   Assessment & Plan: 72 y.o. man s/p L1-S1 decompression / PSIF, recovering well.  -discharge home today  Judith Part  10/20/22 8:58 AM

## 2022-10-20 NOTE — Evaluation (Signed)
Occupational Therapy Evaluation Patient Details Name: Todd Mendoza MRN: [id number] DOB: [birth year] Today's Date: 10/20/2022   History of Present Illness 72 yo M adm 1/25 for scheduled PLIF.  PMH includes: Arthritis, Asthma, Hepatitis, Hyperlipidemia, Hypertension, Stroke, Vertigo   Clinical Impression   Patient admitted for the procedure above.  PTA he lives at home with his spouse, who is able to assist as needed.  He continues to drive, remains active, and needed no assist with ADL or iADL.  Post op discomfort is the primary deficit.  Currently he needs Min A for lower body ADL and is moving with generalized supervision.  Precautions reviewed and all questions answered.  No further needs in the acute setting, recommend follow up as prescribed by MD.       Recommendations for follow up therapy are one component of a multi-disciplinary discharge planning process, led by the attending physician.  Recommendations may be updated based on patient status, additional functional criteria and insurance authorization.   Follow Up Recommendations  No OT follow up     Assistance Recommended at Discharge Set up Supervision/Assistance  Patient can return home with the following A little help with walking and/or transfers;A little help with bathing/dressing/bathroom;Assist for transportation;Assistance with cooking/housework    Functional Status Assessment  Patient has had a recent decline in their functional status and demonstrates the ability to make significant improvements in function in a reasonable and predictable amount of time.  Equipment Recommendations  None recommended by OT    Recommendations for Other Services       Precautions / Restrictions Precautions Precautions: Back Precaution Booklet Issued: Yes (comment) Precaution Comments: reviewed Restrictions Weight Bearing Restrictions: No      Mobility Bed Mobility Overal bed mobility: Needs Assistance Bed Mobility: Sidelying to  Sit, Sit to Sidelying   Sidelying to sit: Supervision     Sit to sidelying: Supervision      Transfers Overall transfer level: Needs assistance Equipment used: None Transfers: Sit to/from Stand, Bed to chair/wheelchair/BSC Sit to Stand: Supervision     Step pivot transfers: Supervision            Balance Overall balance assessment: Mild deficits observed, not formally tested                                         ADL either performed or assessed with clinical judgement   ADL       Grooming: Wash/dry hands;Wash/dry face;Supervision/safety               Lower Body Dressing: Minimal assistance;Sit to/from stand   Toilet Transfer: Supervision/safety;Ambulation                   Vision Patient Visual Report: No change from baseline       Perception     Praxis      Pertinent Vitals/Pain Pain Assessment Pain Assessment: Faces Faces Pain Scale: Hurts little more Pain Location: Incisional Pain Descriptors / Indicators: Aching, Tender Pain Intervention(s): Monitored during session     Hand Dominance Right   Extremity/Trunk Assessment Upper Extremity Assessment Upper Extremity Assessment: Overall WFL for tasks assessed   Lower Extremity Assessment Lower Extremity Assessment: Defer to PT evaluation   Cervical / Trunk Assessment Cervical / Trunk Assessment: Back Surgery   Communication Communication Communication: No difficulties   Cognition Arousal/Alertness: Awake/alert Behavior During Therapy: WFL for tasks assessed/performed Overall  Cognitive Status: Within Functional Limits for tasks assessed                                                        Home Living Family/patient expects to be discharged to:: Private residence Living Arrangements: Spouse/significant other Available Help at Discharge: Family;Available 24 hours/day Type of Home: House Home Access: Stairs to enter [employer] of Steps: 4 Entrance Stairs-Rails: Right;Left;Can reach both Home Layout: One level     Bathroom Shower/Tub: Teacher, early years/pre: Standard Bathroom Accessibility: Yes How Accessible: Accessible via walker Home Equipment: None          Prior Functioning/Environment Prior Level of Function : Independent/Modified Independent                        OT Problem List: Pain      OT Treatment/Interventions:      OT Goals(Current goals can be found in the care plan section) Acute Rehab OT Goals Patient Stated Goal: return home today OT Goal Formulation: With patient Time For Goal Achievement: 10/23/22 Potential to Achieve Goals: Good  OT Frequency:      Co-evaluation              AM-PAC OT "6 Clicks" Daily Activity     Outcome Measure Help from another person eating meals?: None Help from another person taking care of personal grooming?: None Help from another person toileting, which includes using toliet, bedpan, or urinal?: A Little Help from another person bathing (including washing, rinsing, drying)?: A Little Help from another person to put on and taking off regular upper body clothing?: None Help from another person to put on and taking off regular lower body clothing?: A Little 6 Click Score: 21   End of Session Nurse Communication: Mobility status  Activity Tolerance: Patient tolerated treatment well Patient left: in chair;with call bell/phone within reach;with family/visitor present  OT Visit Diagnosis: Unsteadiness on feet (R26.81)                Time: 5456-2563 OT Time Calculation (min): 21 min Charges:  OT General Charges $OT Visit: 1 Visit OT Evaluation $OT Eval Moderate Complexity: 1 Mod  10/20/2022  RP, OTR/L  Acute Rehabilitation Services  Office:  [phone]   Metta Clines 10/20/2022, 9:11 AM

## 2022-10-20 NOTE — Evaluation (Signed)
Physical Therapy Evaluation  Patient Details Name: Todd Mendoza MRN: [id number] DOB: May 01, 1951 Today's Date: 10/20/2022  History of Present Illness  Pt is a 72 y/o M who presents s/p L1-L5 laminectomy and fusion on 10/19/22.  PMH significant for Arthritis, Asthma, Hepatitis,  Hypertension, Stroke, Vertigo   Clinical Impression  Pt admitted with above diagnosis. At the time of PT eval, pt was able to demonstrate transfers and ambulation with gross supervision for safety to modified independence and no AD. Pt completed stair training with min guard assist. Pt was educated on precautions, brace application/wearing schedule, appropriate activity progression, and car transfer. Pt currently with functional limitations due to the deficits listed below (see PT Problem List). Pt will benefit from skilled PT to increase their independence and safety with mobility to allow discharge to the venue listed below.         Recommendations for follow up therapy are one component of a multi-disciplinary discharge planning process, led by the attending physician.  Recommendations may be updated based on patient status, additional functional criteria and insurance authorization.  Follow Up Recommendations No PT follow up      Assistance Recommended at Discharge PRN  Patient can return home with the following  A little help with walking and/or transfers;A little help with bathing/dressing/bathroom;Assistance with cooking/housework;Assist for transportation;Help with stairs or ramp for entrance    Equipment Recommendations None recommended by PT  Recommendations for Other Services       Functional Status Assessment Patient has had a recent decline in their functional status and demonstrates the ability to make significant improvements in function in a reasonable and predictable amount of time.     Precautions / Restrictions Precautions Precautions: Back;Fall Precaution Booklet Issued: Yes  (comment) Precaution Comments: Reviewed handout and pt was cued for precautions during functional mobility. Required Braces or Orthoses: Spinal Brace Spinal Brace: Thoracolumbosacral orthotic;Applied in sitting position Restrictions Weight Bearing Restrictions: No      Mobility  Bed Mobility               General bed mobility comments: Pt was received sitting up in the recliner. Reviewed verbally    Transfers Overall transfer level: Modified independent Equipment used: None Transfers: Sit to/from Stand             General transfer comment: No assist required. VC's for improved posture during sit<>stand.    Ambulation/Gait Ambulation/Gait assistance: Supervision Gait Distance (Feet): 400 Feet Assistive device: None Gait Pattern/deviations: Step-through pattern, Decreased stride length, Trunk flexed, Narrow base of support Gait velocity: Decreased Gait velocity interpretation: <1.31 ft/sec, indicative of household ambulator   General Gait Details: VC's for improved posture, closer walker proximity, and forward gaze.  Stairs Stairs: Yes Stairs assistance: Min guard Stair Management: One rail Right, Step to pattern, Forwards Number of Stairs: 4 General stair comments: VC's for sequencing and general safety.  Wheelchair Mobility    Modified Rankin (Stroke Patients Only)       Balance Overall balance assessment: Mild deficits observed, not formally tested                                           Pertinent Vitals/Pain Pain Assessment Pain Assessment: Faces Faces Pain Scale: Hurts little more Pain Location: Incisional Pain Descriptors / Indicators: Aching, Tender, Operative site guarding Pain Intervention(s): Limited activity within patient's tolerance, Monitored during session, Repositioned  Home Living Family/patient expects to be discharged to:: Private residence Living Arrangements: Spouse/significant other Available Help at  Discharge: Family;Available 24 hours/day Type of Home: House Home Access: Stairs to enter Entrance Stairs-Rails: Right;Left;Can reach both Entrance Stairs-Number of Steps: 4   Home Layout: One level Home Equipment: None      Prior Function Prior Level of Function : Independent/Modified Independent                     Hand Dominance   Dominant Hand: Right    Extremity/Trunk Assessment   Upper Extremity Assessment Upper Extremity Assessment: Defer to OT evaluation    Lower Extremity Assessment Lower Extremity Assessment: Generalized weakness (MIld; consistent with pre-op diagnosis)    Cervical / Trunk Assessment Cervical / Trunk Assessment: Back Surgery  Communication   Communication: No difficulties  Cognition Arousal/Alertness: Awake/alert Behavior During Therapy: WFL for tasks assessed/performed Overall Cognitive Status: Within Functional Limits for tasks assessed                                          General Comments      Exercises     Assessment/Plan    PT Assessment Patient needs continued PT services  PT Problem List Decreased strength;Decreased range of motion;Decreased activity tolerance;Decreased balance;Decreased mobility;Decreased knowledge of use of DME;Decreased safety awareness;Decreased knowledge of precautions;Pain       PT Treatment Interventions Gait training;DME instruction;Stair training;Functional mobility training;Therapeutic activities;Therapeutic exercise;Balance training;Patient/family education    PT Goals (Current goals can be found in the Care Plan section)  Acute Rehab PT Goals Patient Stated Goal: Home today PT Goal Formulation: With patient/family Time For Goal Achievement: 10/27/22 Potential to Achieve Goals: Good    Frequency Min 5X/week     Co-evaluation               AM-PAC PT "6 Clicks" Mobility  Outcome Measure Help needed turning from your back to your side while in a flat bed  without using bedrails?: None Help needed moving from lying on your back to sitting on the side of a flat bed without using bedrails?: None Help needed moving to and from a bed to a chair (including a wheelchair)?: None Help needed standing up from a chair using your arms (e.g., wheelchair or bedside chair)?: None Help needed to walk in hospital room?: A Little Help needed climbing 3-5 steps with a railing? : A Little 6 Click Score: 22    End of Session Equipment Utilized During Treatment: Gait belt;Back brace Activity Tolerance: Patient tolerated treatment well Patient left: in chair;with call bell/phone within reach;with family/visitor present Nurse Communication: Mobility status PT Visit Diagnosis: Unsteadiness on feet (R26.81);Pain Pain - part of body:  (back)    Time: 4696-2952 PT Time Calculation (min) (ACUTE ONLY): 16 min   Charges:   PT Evaluation $PT Eval Low Complexity: 1 Low          Rolinda Roan, PT, DPT Acute Rehabilitation Services Secure Chat Preferred Office: [phone]   Thelma Comp 10/20/2022, 9:20 AM

## 2022-10-23 ENCOUNTER — Telehealth: Payer: Self-pay

## 2022-10-23 NOTE — Patient Outreach (Signed)
  Care Coordination TOC Note Transition Care Management Follow-up Telephone Call Date of discharge and from where: 10/20/22-[facility]  Dx: "radiculopathy-lumbar region s/p surgery" How have you been since you were released from the hospital? Patient states he is doing okay. Pain is managed. He voices he has only had to take pain meds about three times since returning home. He has bene up walking and moving around. He reports spouse bought him a walker but he has not needed to use it.  Any questions or concerns? Yes-Patient complains of bad hiccups since returning home. He has tried several remedies without success. Spouse called MD office and MD has called in some medicine for patient to take. Spouse has just left the house to get med from pharmacy.   Items Reviewed: Did the pt receive and understand the discharge instructions provided? Yes  Medications obtained and verified? Yes  Other? No  Any new allergies since your discharge? No  Dietary orders reviewed? No Do you have support at home? Yes   Home Care and Equipment/Supplies: Were home health services ordered? not applicable If so, what is the name of the agency? N/A  Has the agency set up a time to come to the patient's home? not applicable Were any new equipment or medical supplies ordered?  No What is the name of the medical supply agency? N/A Were you able to get the supplies/equipment? not applicable Do you have any questions related to the use of the equipment or supplies? No  Functional Questionnaire: (I = Independent and D = Dependent) ADLs: A  Bathing/Dressing- A  Meal Prep- A  Eating- I  Maintaining continence- I  Transferring/Ambulation- I  Managing Meds- A  Follow up appointments reviewed:  PCP Hospital f/u appt confirmed? Yes  Scheduled to see Dinah Ngetich,NP on 11/16/22 @ [facility] f/u appt confirmed? Yes  Scheduled to see Dr. Zada Finders.patient states he can not remember the exact date as  spouse arranged appt and has it written down. Are transportation arrangements needed? No  If their condition worsens, is the pt aware to call PCP or go to the Emergency Dept.? Yes Was the patient provided with contact information for the PCP's office or ED? Yes Was to pt encouraged to call back with questions or concerns? Yes  SDOH assessments and interventions completed:   Yes SDOH Interventions Today    Flowsheet Row Most Recent Value  SDOH Interventions   Food Insecurity Interventions Intervention Not Indicated  Transportation Interventions Intervention Not Indicated       Care Coordination Interventions:   Interventions Today    Flowsheet Row Most Recent Value  Nutrition Interventions   Nutrition Discussed/Reviewed Nutrition Discussed  Safety Interventions   Safety Discussed/Reviewed Safety Discussed, Home Safety, Fall Risk               Encounter Outcome:  Pt. Visit Completed    Enzo Montgomery, RN,BSN,CCM [facility] Telephonic Care Management Coordinator Direct Phone: [phone] Toll Free: [phone] Fax: [phone]

## 2022-10-24 ENCOUNTER — Other Ambulatory Visit: Payer: Self-pay

## 2022-10-24 ENCOUNTER — Inpatient Hospital Stay (HOSPITAL_COMMUNITY)
Admission: EM | Admit: 2022-10-24 | Discharge: 2022-10-27 | DRG: 378 | Disposition: A | Discharge status: Home / Self Care | Payer: Medicare Other | Attending: Neurological Surgery | Admitting: Neurological Surgery

## 2022-10-24 ENCOUNTER — Emergency Department (HOSPITAL_COMMUNITY): Payer: Medicare Other

## 2022-10-24 ENCOUNTER — Inpatient Hospital Stay (HOSPITAL_COMMUNITY): Payer: Medicare Other

## 2022-10-24 ENCOUNTER — Encounter (HOSPITAL_COMMUNITY): Payer: Self-pay | Admitting: Neurological Surgery

## 2022-10-24 DIAGNOSIS — J4489 Other specified chronic obstructive pulmonary disease: Secondary | ICD-10-CM | POA: Diagnosis present

## 2022-10-24 DIAGNOSIS — R748 Abnormal levels of other serum enzymes: Secondary | ICD-10-CM | POA: Diagnosis present

## 2022-10-24 DIAGNOSIS — B3781 Candidal esophagitis: Secondary | ICD-10-CM | POA: Insufficient documentation

## 2022-10-24 DIAGNOSIS — I700 Atherosclerosis of aorta: Secondary | ICD-10-CM | POA: Diagnosis not present

## 2022-10-24 DIAGNOSIS — K209 Esophagitis, unspecified without bleeding: Secondary | ICD-10-CM

## 2022-10-24 DIAGNOSIS — M549 Dorsalgia, unspecified: Secondary | ICD-10-CM | POA: Diagnosis not present

## 2022-10-24 DIAGNOSIS — K922 Gastrointestinal hemorrhage, unspecified: Secondary | ICD-10-CM | POA: Diagnosis not present

## 2022-10-24 DIAGNOSIS — G8918 Other acute postprocedural pain: Secondary | ICD-10-CM | POA: Diagnosis not present

## 2022-10-24 DIAGNOSIS — Z8673 Personal history of transient ischemic attack (TIA), and cerebral infarction without residual deficits: Secondary | ICD-10-CM | POA: Diagnosis not present

## 2022-10-24 DIAGNOSIS — Z7951 Long term (current) use of inhaled steroids: Secondary | ICD-10-CM

## 2022-10-24 DIAGNOSIS — I10 Essential (primary) hypertension: Secondary | ICD-10-CM | POA: Diagnosis present

## 2022-10-24 DIAGNOSIS — K2100 Gastro-esophageal reflux disease with esophagitis, without bleeding: Secondary | ICD-10-CM | POA: Diagnosis not present

## 2022-10-24 DIAGNOSIS — R195 Other fecal abnormalities: Secondary | ICD-10-CM | POA: Diagnosis not present

## 2022-10-24 DIAGNOSIS — T40605A Adverse effect of unspecified narcotics, initial encounter: Secondary | ICD-10-CM | POA: Diagnosis present

## 2022-10-24 DIAGNOSIS — N179 Acute kidney failure, unspecified: Secondary | ICD-10-CM | POA: Diagnosis not present

## 2022-10-24 DIAGNOSIS — Z8619 Personal history of other infectious and parasitic diseases: Secondary | ICD-10-CM

## 2022-10-24 DIAGNOSIS — R066 Hiccough: Secondary | ICD-10-CM | POA: Diagnosis present

## 2022-10-24 DIAGNOSIS — M1990 Unspecified osteoarthritis, unspecified site: Secondary | ICD-10-CM | POA: Diagnosis present

## 2022-10-24 DIAGNOSIS — Z79899 Other long term (current) drug therapy: Secondary | ICD-10-CM

## 2022-10-24 DIAGNOSIS — K567 Ileus, unspecified: Secondary | ICD-10-CM | POA: Diagnosis not present

## 2022-10-24 DIAGNOSIS — R140 Abdominal distension (gaseous): Secondary | ICD-10-CM | POA: Diagnosis present

## 2022-10-24 DIAGNOSIS — E781 Pure hyperglyceridemia: Secondary | ICD-10-CM | POA: Diagnosis present

## 2022-10-24 DIAGNOSIS — K219 Gastro-esophageal reflux disease without esophagitis: Secondary | ICD-10-CM | POA: Diagnosis present

## 2022-10-24 DIAGNOSIS — L7632 Postprocedural hematoma of skin and subcutaneous tissue following other procedure: Secondary | ICD-10-CM | POA: Diagnosis not present

## 2022-10-24 DIAGNOSIS — Z833 Family history of diabetes mellitus: Secondary | ICD-10-CM | POA: Diagnosis not present

## 2022-10-24 DIAGNOSIS — K3189 Other diseases of stomach and duodenum: Secondary | ICD-10-CM | POA: Diagnosis present

## 2022-10-24 DIAGNOSIS — R338 Other retention of urine: Secondary | ICD-10-CM | POA: Diagnosis present

## 2022-10-24 DIAGNOSIS — K2950 Unspecified chronic gastritis without bleeding: Secondary | ICD-10-CM | POA: Diagnosis not present

## 2022-10-24 DIAGNOSIS — R109 Unspecified abdominal pain: Secondary | ICD-10-CM | POA: Diagnosis not present

## 2022-10-24 DIAGNOSIS — Z9889 Other specified postprocedural states: Secondary | ICD-10-CM | POA: Diagnosis not present

## 2022-10-24 DIAGNOSIS — Z7982 Long term (current) use of aspirin: Secondary | ICD-10-CM

## 2022-10-24 DIAGNOSIS — N400 Enlarged prostate without lower urinary tract symptoms: Secondary | ICD-10-CM | POA: Diagnosis present

## 2022-10-24 DIAGNOSIS — J449 Chronic obstructive pulmonary disease, unspecified: Secondary | ICD-10-CM | POA: Diagnosis not present

## 2022-10-24 DIAGNOSIS — D649 Anemia, unspecified: Secondary | ICD-10-CM | POA: Diagnosis not present

## 2022-10-24 DIAGNOSIS — D62 Acute posthemorrhagic anemia: Secondary | ICD-10-CM | POA: Diagnosis present

## 2022-10-24 DIAGNOSIS — R509 Fever, unspecified: Secondary | ICD-10-CM

## 2022-10-24 DIAGNOSIS — R945 Abnormal results of liver function studies: Secondary | ICD-10-CM | POA: Diagnosis not present

## 2022-10-24 DIAGNOSIS — Y838 Other surgical procedures as the cause of abnormal reaction of the patient, or of later complication, without mention of misadventure at the time of the procedure: Secondary | ICD-10-CM | POA: Diagnosis present

## 2022-10-24 DIAGNOSIS — Z87891 Personal history of nicotine dependence: Secondary | ICD-10-CM

## 2022-10-24 DIAGNOSIS — N401 Enlarged prostate with lower urinary tract symptoms: Secondary | ICD-10-CM | POA: Diagnosis present

## 2022-10-24 DIAGNOSIS — K5903 Drug induced constipation: Secondary | ICD-10-CM | POA: Diagnosis not present

## 2022-10-24 DIAGNOSIS — K31A19 Gastric intestinal metaplasia without dysplasia, unspecified site: Secondary | ICD-10-CM | POA: Diagnosis not present

## 2022-10-24 LAB — TYPE AND SCREEN
ABO/RH(D): A POS
Antibody Screen: NEGATIVE

## 2022-10-24 LAB — CBC
HCT: 24.3 % — ABNORMAL LOW (ref 39.0–52.0)
Hemoglobin: 7.8 g/dL — ABNORMAL LOW (ref 13.0–17.0)
MCH: 28.4 pg (ref 26.0–34.0)
MCHC: 32.1 g/dL (ref 30.0–36.0)
MCV: 88.4 fL (ref 80.0–100.0)
Platelets: 234 10*3/uL (ref 150–400)
RBC: 2.75 MIL/uL — ABNORMAL LOW (ref 4.22–5.81)
RDW: 12.9 % (ref 11.5–15.5)
WBC: 8.9 10*3/uL (ref 4.0–10.5)
nRBC: 0 % (ref 0.0–0.2)

## 2022-10-24 LAB — URINALYSIS, ROUTINE W REFLEX MICROSCOPIC
Bacteria, UA: NONE SEEN
Bilirubin Urine: NEGATIVE
Glucose, UA: NEGATIVE mg/dL
Ketones, ur: NEGATIVE mg/dL
Leukocytes,Ua: NEGATIVE
Nitrite: NEGATIVE
Protein, ur: 30 mg/dL — AB
Specific Gravity, Urine: 1.018 (ref 1.005–1.030)
pH: 7 (ref 5.0–8.0)

## 2022-10-24 LAB — BASIC METABOLIC PANEL
Anion gap: 11 (ref 5–15)
BUN: 33 mg/dL — ABNORMAL HIGH (ref 8–23)
CO2: 30 mmol/L (ref 22–32)
Calcium: 9.1 mg/dL (ref 8.9–10.3)
Chloride: 97 mmol/L — ABNORMAL LOW (ref 98–111)
Creatinine, Ser: 1.52 mg/dL — ABNORMAL HIGH (ref 0.61–1.24)
GFR, Estimated: 48 mL/min — ABNORMAL LOW (ref >60)
Glucose, Bld: 111 mg/dL — ABNORMAL HIGH (ref 70–99)
Potassium: 3.5 mmol/L (ref 3.5–5.1)
Sodium: 138 mmol/L (ref 135–145)

## 2022-10-24 LAB — POC OCCULT BLOOD, ED: Fecal Occult Bld: POSITIVE — AB

## 2022-10-24 LAB — HEPATIC FUNCTION PANEL
ALT: 53 U/L — ABNORMAL HIGH (ref 0–44)
AST: 145 U/L — ABNORMAL HIGH (ref 15–41)
Albumin: 3.1 g/dL — ABNORMAL LOW (ref 3.5–5.0)
Alkaline Phosphatase: 63 U/L (ref 38–126)
Bilirubin, Direct: 1.1 mg/dL — ABNORMAL HIGH (ref 0.0–0.2)
Indirect Bilirubin: 1.1 mg/dL — ABNORMAL HIGH (ref 0.3–0.9)
Total Bilirubin: 2.2 mg/dL — ABNORMAL HIGH (ref 0.3–1.2)
Total Protein: 7.2 g/dL (ref 6.5–8.1)

## 2022-10-24 LAB — PROTIME-INR
INR: 1.3 — ABNORMAL HIGH (ref 0.8–1.2)
Prothrombin Time: 16 seconds — ABNORMAL HIGH (ref 11.4–15.2)

## 2022-10-24 LAB — HEMOGLOBIN AND HEMATOCRIT, BLOOD
HCT: 22.7 % — ABNORMAL LOW (ref 39.0–52.0)
Hemoglobin: 7.6 g/dL — ABNORMAL LOW (ref 13.0–17.0)

## 2022-10-24 MED ORDER — ACETAMINOPHEN 650 MG RE SUPP: 650.0000 mg | RECTAL | Status: DC | PRN

## 2022-10-24 MED ORDER — FENOFIBRATE 160 MG PO TABS
160.0000 mg | ORAL_TABLET | Freq: Every day | ORAL | Status: DC
  Administered 2022-10-25 – 2022-10-27 (×3): 160 mg via ORAL
  Filled 2022-10-24 (×3): qty 1

## 2022-10-24 MED ORDER — SODIUM CHLORIDE 0.9 % IV SOLN: INTRAVENOUS | Status: DC

## 2022-10-24 MED ORDER — PANTOPRAZOLE SODIUM 40 MG IV SOLR
40.0000 mg | INTRAVENOUS | Status: DC
  Administered 2022-10-24: 40 mg via INTRAVENOUS
  Filled 2022-10-24: qty 10

## 2022-10-24 MED ORDER — AMLODIPINE BESYLATE 5 MG PO TABS
5.0000 mg | ORAL_TABLET | Freq: Every day | ORAL | Status: DC
  Filled 2022-10-24: qty 1

## 2022-10-24 MED ORDER — OXYCODONE HCL 5 MG PO TABS
5.0000 mg | ORAL_TABLET | ORAL | Status: DC | PRN
  Administered 2022-10-24 – 2022-10-25 (×4): 5 mg via ORAL
  Filled 2022-10-24 (×4): qty 1

## 2022-10-24 MED ORDER — DOCUSATE SODIUM 100 MG PO CAPS
100.0000 mg | ORAL_CAPSULE | Freq: Two times a day (BID) | ORAL | Status: DC
  Administered 2022-10-24 – 2022-10-27 (×5): 100 mg via ORAL
  Filled 2022-10-24 (×6): qty 1

## 2022-10-24 MED ORDER — ROSUVASTATIN CALCIUM 20 MG PO TABS
20.0000 mg | ORAL_TABLET | Freq: Every day | ORAL | Status: DC
  Administered 2022-10-24 – 2022-10-26 (×3): 20 mg via ORAL
  Filled 2022-10-24 (×3): qty 1

## 2022-10-24 MED ORDER — SODIUM CHLORIDE 0.9% FLUSH: 3.0000 mL | INTRAVENOUS | Status: DC | PRN

## 2022-10-24 MED ORDER — HYDROMORPHONE HCL 1 MG/ML IJ SOLN
1.0000 mg | INTRAMUSCULAR | Status: DC | PRN
  Administered 2022-10-25 – 2022-10-27 (×4): 1 mg via INTRAVENOUS
  Filled 2022-10-24 (×4): qty 1

## 2022-10-24 MED ORDER — HYDRALAZINE HCL 20 MG/ML IJ SOLN: 10.0000 mg | INTRAMUSCULAR | Status: DC | PRN

## 2022-10-24 MED ORDER — TAMSULOSIN HCL 0.4 MG PO CAPS
0.4000 mg | ORAL_CAPSULE | Freq: Every day | ORAL | Status: DC
  Administered 2022-10-24 – 2022-10-27 (×4): 0.4 mg via ORAL
  Filled 2022-10-24 (×4): qty 1

## 2022-10-24 MED ORDER — FINASTERIDE 5 MG PO TABS
5.0000 mg | ORAL_TABLET | Freq: Every day | ORAL | Status: DC
  Administered 2022-10-25 – 2022-10-27 (×3): 5 mg via ORAL
  Filled 2022-10-24 (×3): qty 1

## 2022-10-24 MED ORDER — AMLODIPINE BESYLATE 5 MG PO TABS
5.0000 mg | ORAL_TABLET | Freq: Every day | ORAL | Status: DC
  Administered 2022-10-25 – 2022-10-27 (×3): 5 mg via ORAL
  Filled 2022-10-24 (×3): qty 1

## 2022-10-24 MED ORDER — HYDROCHLOROTHIAZIDE 25 MG PO TABS
25.0000 mg | ORAL_TABLET | Freq: Every day | ORAL | Status: DC
  Filled 2022-10-24: qty 1

## 2022-10-24 MED ORDER — SODIUM CHLORIDE 0.9 % IV SOLN: 25.0000 mg | Freq: Three times a day (TID) | INTRAVENOUS | Status: DC | PRN

## 2022-10-24 MED ORDER — POLYETHYLENE GLYCOL 3350 17 G PO PACK: 17.0000 g | PACK | Freq: Every day | ORAL | Status: DC | PRN

## 2022-10-24 MED ORDER — FINASTERIDE 5 MG PO TABS
5.0000 mg | ORAL_TABLET | Freq: Every day | ORAL | Status: DC
  Filled 2022-10-24: qty 1

## 2022-10-24 MED ORDER — HYDROCHLOROTHIAZIDE 25 MG PO TABS: 25.0000 mg | ORAL_TABLET | Freq: Every day | ORAL | Status: DC

## 2022-10-24 MED ORDER — ONDANSETRON HCL 4 MG/2ML IJ SOLN: 4.0000 mg | Freq: Four times a day (QID) | INTRAMUSCULAR | Status: DC | PRN

## 2022-10-24 MED ORDER — ONDANSETRON HCL 4 MG PO TABS: 4.0000 mg | ORAL_TABLET | Freq: Four times a day (QID) | ORAL | Status: DC | PRN

## 2022-10-24 MED ORDER — ACETAMINOPHEN 325 MG PO TABS
650.0000 mg | ORAL_TABLET | ORAL | Status: DC | PRN
  Administered 2022-10-24: 650 mg via ORAL
  Filled 2022-10-24: qty 2

## 2022-10-24 MED ORDER — MENTHOL 3 MG MT LOZG: 1.0000 | LOZENGE | OROMUCOSAL | Status: DC | PRN

## 2022-10-24 MED ORDER — OXYCODONE HCL 5 MG PO TABS: 5.0000 mg | ORAL_TABLET | ORAL | Status: DC | PRN

## 2022-10-24 MED ORDER — PHENOL 1.4 % MT LIQD: 1.0000 | OROMUCOSAL | Status: DC | PRN

## 2022-10-24 MED ORDER — CYCLOBENZAPRINE HCL 10 MG PO TABS: 10.0000 mg | ORAL_TABLET | Freq: Three times a day (TID) | ORAL | Status: DC | PRN

## 2022-10-24 MED ORDER — ROSUVASTATIN CALCIUM 20 MG PO TABS
20.0000 mg | ORAL_TABLET | Freq: Every day | ORAL | Status: DC
  Filled 2022-10-24: qty 1

## 2022-10-24 MED ORDER — FLUTICASONE FUROATE-VILANTEROL 100-25 MCG/ACT IN AEPB
1.0000 | INHALATION_SPRAY | Freq: Every day | RESPIRATORY_TRACT | Status: DC
  Administered 2022-10-25 – 2022-10-27 (×3): 1 via RESPIRATORY_TRACT
  Filled 2022-10-24: qty 28

## 2022-10-24 MED ORDER — SODIUM CHLORIDE 0.9 % IV SOLN: 250.0000 mL | INTRAVENOUS | Status: DC

## 2022-10-24 MED ORDER — PANTOPRAZOLE SODIUM 40 MG IV SOLR
40.0000 mg | Freq: Two times a day (BID) | INTRAVENOUS | Status: DC
  Administered 2022-10-24: 40 mg via INTRAVENOUS
  Filled 2022-10-24 (×2): qty 10

## 2022-10-24 MED ORDER — SODIUM CHLORIDE 0.9% FLUSH: 3.0000 mL | Freq: Two times a day (BID) | INTRAVENOUS | Status: DC

## 2022-10-24 NOTE — H&P (Signed)
Neurosurgery H&P  CC: Back pain  HPI: This is a 72 y.o. man that presents post-op with hiccoughs, abdominal distention, and some bleeding from his incision with some increase in low back pain. He denies any new weakness, numbness, or parasthesias, no incontinence. He does note some more difficulty voiding than usual but has been able to void. Had a BM this morning that was green in color but normal consistency, but reports no flatus. Does endorse some abdominal pain but back hurts more than abdomen. No recent use of anti-platelet or anti-coagulant medications except +ASA81. ROS + for hiccoughs, some poor diet since surgery but tolerating liquids and solids.   ROS: A 14 point ROS was performed and is negative except as noted in the HPI.   PMHx:  Past Medical History:  Diagnosis Date   Arthritis    Asthma    Hepatitis    treated for Hep C in the past   History of substance abuse ([place])    cocaine- over 20 years ago   Hyperlipidemia    Hypertension    Stroke ([place])    Vertigo    FamHx:  Family History  Problem Relation Age of Onset   Diabetes Father    Colon cancer Neg Hx    Colon polyps Neg Hx    Esophageal cancer Neg Hx    Stomach cancer Neg Hx    Rectal cancer Neg Hx    SocHx:  reports that he has quit smoking. His smoking use included cigarettes. He smoked an average of .2 packs per day. He has never used smokeless tobacco. He reports that he does not currently use drugs after having used the following drugs: Cocaine. He reports that he does not drink alcohol.  Exam: Vital signs in last 24 hours: Temp:  [97.9 F (36.6 C)-98.8 F (37.1 C)] 97.9 F (36.6 C) (01/30 1151) Pulse Rate:  [89-95] 89 (01/30 1151) Resp:  [18-20] 18 (01/30 1151) BP: (122-143)/(75-78) 143/75 (01/30 1151) SpO2:  [92 %-96 %] 92 % (01/30 1151) General: Awake, alert, cooperative, lying in bed in NAD Head: Normocephalic and atruamatic HEENT: Neck supple Pulmonary: breathing room air comfortably, no  evidence of increased work of breathing Cardiac: RRR Abdomen: Distended, hypertympanic superiorly, non-tender, no rebound / guarding  Extremities: Warm and well perfused x4 Neuro: Strength 5/5 x4, SILTx4 Incision w/ some bruising, no active bleeding but some scant dried blood on dressing, no erythema / induration / purulence   Assessment and Plan: 72 y.o. man s/p open lumbar decompression and PSIF with abdominal distention, back pain.  -admit for further workup -suspect ileus with abdominal distention that's worsening his back pain, but does have worsening renal function and a large drop in Hb that is unexplained - preop Hb of 12.4 and lost net 300-400cc intra-op, Hb now at 7.8 -KUB -medicine consult -diet as tolerated  Judith Part, MD 10/24/22 1:01 PM [facility]

## 2022-10-24 NOTE — ED Notes (Signed)
CALLED FOR PATIENT NO ANSWER AT THIS TIME.

## 2022-10-24 NOTE — ED Notes (Signed)
ED TO INPATIENT HANDOFF REPORT  ED Nurse Name and Phone #: Bryson Ha [phone]  S Name/Age/Gender Todd Mendoza 72 y.o. male Room/Bed: 002C/002C  Code Status   Code Status: Full Code  Home/SNF/Other Home Patient oriented to: self, place, time, and situation Is this baseline? Yes   Triage Complete: Triage complete  Chief Complaint [facility]) [K56.7]  Triage Note Patient reports bleeding at incision site of lumbar laminectomy ( 10/19/22) this morning , denies injury or fall .    Allergies No Known Allergies  Level of Care/Admitting Diagnosis ED Disposition     ED Disposition  Admit   Condition  --   Comment  Hospital Area: [facility] [[id number]]  Level of Care: Med-Surg [16]  May admit patient to [facility] or [facility] if equivalent level of care is available:: No  Covid Evaluation: Asymptomatic - no recent exposure (last 10 days) testing not required  Diagnosis: [facility]) [[id number]]  Admitting Physician: Judith Part [[id number]]  Attending Physician: Judith Part [[id number]]  Bed request comments: 3W  Certification:: I certify this patient will need inpatient services for at least 2 midnights  Estimated Length of Stay: 3          B Medical/Surgery History Past Medical History:  Diagnosis Date   Arthritis    Asthma    Hepatitis    treated for Hep C in the past   History of substance abuse ([place])    cocaine- over 20 years ago   Hyperlipidemia    Hypertension    Stroke [facility])    Vertigo    Past Surgical History:  Procedure Laterality Date   COLONOSCOPY     LAMINECTOMY WITH POSTERIOR LATERAL ARTHRODESIS LEVEL 4 N/A 10/19/2022   Procedure: Lumbar one to Sacral one Laminectomies with Lumbar one to Sacral one Posterolateral instrumented fusion;  Surgeon: Judith Part, MD;  Location: [facility];  Service: Neurosurgery;  Laterality: N/A;   MULTIPLE TOOTH EXTRACTIONS     NO PAST SURGERIES       A IV  Location/Drains/Wounds Patient Lines/Drains/Airways Status     Active Line/Drains/Airways     Name Placement date Placement time Site Days   Peripheral IV 10/24/22 20 G Right Antecubital 10/24/22  1437  Antecubital  less than 1            Intake/Output Last 24 hours No intake or output data in the 24 hours ending 10/24/22 2016  Labs/Imaging Results for orders placed or performed during the hospital encounter of 10/24/22 (from the past 48 hour(s))  CBC     Status: Abnormal   Collection Time: 10/24/22  5:15 AM  Result Value Ref Range   WBC 8.9 4.0 - 10.5 K/uL   RBC 2.75 (L) 4.22 - 5.81 MIL/uL   Hemoglobin 7.8 (L) 13.0 - 17.0 g/dL   HCT 24.3 (L) 39.0 - 52.0 %   MCV 88.4 80.0 - 100.0 fL   MCH 28.4 26.0 - 34.0 pg   MCHC 32.1 30.0 - 36.0 g/dL   RDW 12.9 11.5 - 15.5 %   Platelets 234 150 - 400 K/uL   nRBC 0.0 0.0 - 0.2 %    Comment: Performed at [facility], [place] [address] [place], [place] [address]  Basic metabolic panel     Status: Abnormal   Collection Time: 10/24/22  5:15 AM  Result Value Ref Range   Sodium 138 135 - 145 mmol/L   Potassium 3.5 3.5 - 5.1 mmol/L  Chloride 97 (L) 98 - 111 mmol/L   CO2 30 22 - 32 mmol/L   Glucose, Bld 111 (H) 70 - 99 mg/dL    Comment: Glucose reference range applies only to samples taken after fasting for at least 8 hours.   BUN 33 (H) 8 - 23 mg/dL   Creatinine, Ser 1.52 (H) 0.61 - 1.24 mg/dL   Calcium 9.1 8.9 - 10.3 mg/dL   GFR, Estimated 48 (L) >60 mL/min    Comment: (NOTE) Calculated using the CKD-EPI Creatinine Equation (2021)    Anion gap 11 5 - 15    Comment: Performed at [facility] [address] [place], [place] [address]  Hepatic function panel     Status: Abnormal   Collection Time: 10/24/22 12:37 PM  Result Value Ref Range   Total Protein 7.2 6.5 - 8.1 g/dL   Albumin 3.1 (L) 3.5 - 5.0 g/dL   AST 145 (H) 15 - 41 U/L   ALT 53 (H) 0 - 44 U/L   Alkaline Phosphatase 63 38 - 126 U/L   Total Bilirubin 2.2 (H)  0.3 - 1.2 mg/dL   Bilirubin, Direct 1.1 (H) 0.0 - 0.2 mg/dL   Indirect Bilirubin 1.1 (H) 0.3 - 0.9 mg/dL    Comment: Performed at [facility] [address], [place], [place] [address]  Protime-INR     Status: Abnormal   Collection Time: 10/24/22 12:37 PM  Result Value Ref Range   Prothrombin Time 16.0 (H) 11.4 - 15.2 seconds   INR 1.3 (H) 0.8 - 1.2    Comment: (NOTE) INR goal varies based on device and disease states. Performed at [facility], [place] [address] [place], [place] [address]   Type and screen [place]     Status: None   Collection Time: 10/24/22 12:37 PM  Result Value Ref Range   ABO/RH(D) A POS    Antibody Screen NEG    Sample Expiration      10/27/2022,2359 Performed at [facility], [place] [address] [place], [place] [address]   Urinalysis, Routine w reflex microscopic -Urine, Catheterized     Status: Abnormal   Collection Time: 10/24/22  5:07 PM  Result Value Ref Range   Color, Urine AMBER (A) YELLOW    Comment: BIOCHEMICALS MAY BE AFFECTED BY COLOR   APPearance CLEAR CLEAR   Specific Gravity, Urine 1.018 1.005 - 1.030   pH 7.0 5.0 - 8.0   Glucose, UA NEGATIVE NEGATIVE mg/dL   Hgb urine dipstick SMALL (A) NEGATIVE   Bilirubin Urine NEGATIVE NEGATIVE   Ketones, ur NEGATIVE NEGATIVE mg/dL   Protein, ur 30 (A) NEGATIVE mg/dL   Nitrite NEGATIVE NEGATIVE   Leukocytes,Ua NEGATIVE NEGATIVE   RBC / HPF 6-10 0 - 5 RBC/hpf   WBC, UA 11-20 0 - 5 WBC/hpf   Bacteria, UA NONE SEEN NONE SEEN   Squamous Epithelial / HPF 0-5 0 - 5 /HPF    Comment: Performed at [facility], [place] [address], [place], [place] [address]  POC occult blood, ED     Status: Abnormal   Collection Time: 10/24/22  5:13 PM  Result Value Ref Range   Fecal Occult Bld POSITIVE (A) NEGATIVE  Hemoglobin and hematocrit, blood     Status: Abnormal   Collection Time: 10/24/22  6:00 PM  Result Value Ref Range   Hemoglobin 7.6 (L) 13.0 - 17.0 g/dL   HCT 22.7  (L) 39.0 - 52.0 %  Comment: Performed at [facility], [place] [address], [place], [place] [address]   DG Abd 1 View  Result Date: 10/24/2022 CLINICAL DATA:  Abdominal distension EXAM: ABDOMEN - 1 VIEW COMPARISON:  CT abdomen pelvis 10/24/2022 FINDINGS: Nonobstructive bowel gas pattern. No bowel dilatation or bowel wall thickening. Lung bases clear. Degenerative and postsurgical changes lumbar spine. No urinary tract calcification. IMPRESSION: No acute abnormalities. Electronically Signed   By: Lavonia Dana M.D.   On: 10/24/2022 16:38   CT ABDOMEN PELVIS WO CONTRAST  Result Date: 10/24/2022 CLINICAL DATA:  Postoperative abdominal pain. Laminectomy. Bleeding. EXAM: CT ABDOMEN AND PELVIS WITHOUT CONTRAST TECHNIQUE: Multidetector CT imaging of the abdomen and pelvis was performed following the standard protocol without IV contrast. RADIATION DOSE REDUCTION: This exam was performed according to the departmental dose-optimization program which includes automated exposure control, adjustment of the mA and/or kV according to patient size and/or use of iterative reconstruction technique. COMPARISON:  None Available. FINDINGS: Lower chest: Mild atelectasis at the right lung base. No pleural fluid. Hepatobiliary: Liver parenchyma is normal without contrast. No calcified gallstones. Pancreas: Normal Spleen: Normal Adrenals/Urinary Tract: Adrenal glands are normal. Kidneys are normal. No evidence of mass or stone. Mild fullness of the renal collecting systems and ureters, possibly subsequent to a distended bladder. Stomach/Bowel: Stomach and small intestine are normal. Normal appendix. Normal colon. Vascular/Lymphatic: Aortic atherosclerosis. No aneurysm. IVC is normal. No adenopathy. Reproductive: Normal Other: No free fluid or air.  No hernia. Musculoskeletal: Previous lumbosacral fusion M L1 to the sacrum. Pedicle screws and posterior rods. Posterior decompression throughout the region. The spinal canal cannot  be accurately assessed using this technique. No evidence of retroperitoneal bleeding. IMPRESSION: 1. Previous lumbosacral fusion from L1 to the sacrum with posterior decompression. The spinal canal cannot be accurately assessed using this technique. No evidence of retroperitoneal bleeding. 2. Mild fullness of the renal collecting systems and ureters, possibly subsequent to a distended bladder. No obstructing lesion is seen to explain the distended bladder. The prostate gland is not markedly enlarged. 3. Aortic atherosclerosis. 4. Mild atelectasis at the right lung base. Aortic Atherosclerosis (ICD10-I70.0). Electronically Signed   By: Nelson Chimes M.D.   On: 10/24/2022 15:04   DG Chest Port 1 View  Result Date: 10/24/2022 CLINICAL DATA:  Fever. EXAM: PORTABLE CHEST 1 VIEW COMPARISON:  Chest x-ray Feb 13, 2019. FINDINGS: Similar cardiomediastinal silhouette. No consolidation. No visible pleural effusions or pneumothorax. IMPRESSION: No active disease. Electronically Signed   By: Margaretha Sheffield M.D.   On: 10/24/2022 14:12    Pending Labs Unresulted Labs (From admission, onward)     Start     Ordered   10/25/22 0500  Comprehensive metabolic panel  Tomorrow morning,   R        10/24/22 1733   10/24/22 1900  Hepatitis panel, acute  Once,   R        10/24/22 1900   10/24/22 1820  Urine Culture (for pregnant, neutropenic or urologic patients or patients with an indwelling urinary catheter)  (Urine Labs)  Once,   R       Question:  Indication  Answer:  Urgency/frequency   10/24/22 1820   10/24/22 1733  Hemoglobin and hematocrit, blood  Now then every 6 hours,   R (with TIMED occurrences)      10/24/22 1733   10/24/22 1219  Blood culture (routine x 2)  BLOOD CULTURE X 2,   R (with STAT occurrences)      10/24/22  1218            Vitals/Pain Today's Vitals   10/24/22 1709 10/24/22 1715 10/24/22 1800 10/24/22 1954  BP:  125/65 126/75   Pulse:  86 92 86  Resp:  '13 17 12  '$ Temp: 99.6 F (37.6  C)     TempSrc: Oral     SpO2:  93% 94% 93%  PainSc:        Isolation Precautions No active isolations  Medications Medications  fenofibrate tablet 160 mg (has no administration in time range)  tamsulosin (FLOMAX) capsule 0.4 mg (0.4 mg Oral Given 10/24/22 1639)  acetaminophen (TYLENOL) tablet 650 mg (650 mg Oral Given 10/24/22 1639)    Or  acetaminophen (TYLENOL) suppository 650 mg ( Rectal See Alternative 10/24/22 1639)  HYDROmorphone (DILAUDID) injection 1 mg (has no administration in time range)  docusate sodium (COLACE) capsule 100 mg (has no administration in time range)  polyethylene glycol (MIRALAX / GLYCOLAX) packet 17 g (has no administration in time range)  ondansetron (ZOFRAN) tablet 4 mg (has no administration in time range)    Or  ondansetron (ZOFRAN) injection 4 mg (has no administration in time range)  menthol-cetylpyridinium (CEPACOL) lozenge 3 mg (has no administration in time range)    Or  phenol (CHLORASEPTIC) mouth spray 1 spray (has no administration in time range)  0.9 %  sodium chloride infusion ( Intravenous New Bag/Given 10/24/22 1517)  chlorproMAZINE (THORAZINE) 25 mg in sodium chloride 0.9 % 25 mL IVPB (has no administration in time range)  oxyCODONE (Oxy IR/ROXICODONE) immediate release tablet 5 mg (5 mg Oral Given 10/24/22 1514)  amLODipine (NORVASC) tablet 5 mg (has no administration in time range)  rosuvastatin (CRESTOR) tablet 20 mg (has no administration in time range)  finasteride (PROSCAR) tablet 5 mg (has no administration in time range)  pantoprazole (PROTONIX) injection 40 mg (has no administration in time range)  fluticasone furoate-vilanterol (BREO ELLIPTA) 100-25 MCG/ACT 1 puff (has no administration in time range)  hydrALAZINE (APRESOLINE) injection 10 mg (has no administration in time range)    Mobility walks     Focused Assessments Musculoskeletal  GCS   R Recommendations: See Admitting Provider Note  Report given to:    Additional Notes: AO4, uses urinal

## 2022-10-24 NOTE — ED Notes (Signed)
Surgical site dressing changed

## 2022-10-24 NOTE — ED Provider Triage Note (Cosign Needed Addendum)
Emergency Medicine Provider Triage Evaluation Note  Todd Mendoza , a 72 y.o. male  was evaluated in triage.  Pt complains of surgical site bleeding. States that 4 days ago he had a lumbar laminectomy with Dr. Zada Finders that was without complication. States that since then he has not had any issues, however states he woke up in the night tonight and noticed that his sheets were bloody. States his wife checked his bandages and noted they were saturated in blood. He denies any trauma or worsening pain to the area. No new numbness/tingling in extremities or loss of bowel or bladder function. He is not anticoagulated. Wife states they called the overnight hotline and were advised to come here for evaluation by his neurosurgical team.  Review of Systems  Positive:  Negative:   Physical Exam  BP 123/78 (BP Location: Left Arm)   Pulse 95   Temp 98.8 F (37.1 C) (Oral)   Resp 20   SpO2 95%  Gen:   Awake, no distress  Resp:  Normal effort  MSK:   Moves extremities without difficulty  Other:  Significant hematoma noted throughout the low back area. Incision site is not dehisced. No active bleeding. No neuro defecits. See images below for further  Medical Decision Making  Medically screening exam initiated at 5:18 AM.  Appropriate orders placed.  Todd Mendoza was informed that the remainder of the evaluation will be completed by another provider, this initial triage assessment does not replace that evaluation, and the importance of remaining in the ED until their evaluation is complete.  ADDENDUM: Spoke with neurosurgery on call PA Todd Mendoza who reviewed patients case and states they will see the patient to further evaluate. Upon chart review, does appear patients hemoglobin has dropped from 12 --> 7.8.      Bud Face, PA-C 10/24/22 0523    Bud Face, PA-C 10/24/22 [phone]

## 2022-10-24 NOTE — ED Notes (Signed)
3W ready for pt to be transferred to floor.

## 2022-10-24 NOTE — ED Triage Notes (Signed)
Patient reports bleeding at incision site of lumbar laminectomy ( 10/19/22) this morning , denies injury or fall .

## 2022-10-24 NOTE — ED Provider Notes (Signed)
[facility] Provider Note   CSN: [id number] Arrival date & time: 10/24/22  6659     History  Chief Complaint  Patient presents with   Surgical Incision Bleeding    S/P Laminectomy 10/19/21    Todd Mendoza is a 72 y.o. male.  HPI 72 year old male status post L1-S1 decompression and fixation on January 25 who presents today complaining of bleeding.  He states he did not have any bleeding until this morning.  There was a clear dressing in place.  They noticed blood under the dressing and feel that he has been bleeding from this.  He has had ongoing pain in his back feels that it is worse since Saturday.  He has had chills and fever at home.  He does not endorse having chest pain, cough, or vomiting.  Has had decreased p.o. intake postsurgery.  He has had some difficulty voiding.     Home Medications Prior to Admission medications   Medication Sig Start Date End Date Taking? Authorizing Provider  albuterol (VENTOLIN HFA) 108 (90 Base) MCG/ACT inhaler Inhale 2 puffs into the lungs every 6 (six) hours as needed for wheezing or shortness of breath. 10/04/22   Cobb, Karie Schwalbe, NP  amLODipine (NORVASC) 5 MG tablet TAKE 1 TABLET (5 MG TOTAL) BY MOUTH DAILY. 04/07/22   Ladell Pier, MD  aspirin EC 81 MG tablet Take 1 tablet (81 mg total) by mouth daily. 12/16/12   Othella Boyer, MD  fenofibrate (TRICOR) 145 MG tablet Take 1 tablet (145 mg total) by mouth daily. 07/11/22   Lauree Chandler, NP  finasteride (PROSCAR) 5 MG tablet TAKE 1 TABLET (5 MG TOTAL) BY MOUTH DAILY. 04/07/22   Ladell Pier, MD  fluticasone furoate-vilanterol (BREO ELLIPTA) 100-25 MCG/ACT AEPB Inhale 1 puff into the lungs daily. 10/04/22   Cobb, Karie Schwalbe, NP  hydrochlorothiazide (HYDRODIURIL) 25 MG tablet TAKE 1 TABLET (25 MG TOTAL) BY MOUTH DAILY. 01/23/22 01/23/23  Passmore, Jake Church I, NP  methocarbamol (ROBAXIN) 500 MG tablet Take 1 tablet (500 mg total) by mouth 4  (four) times daily. 07/17/22   Pete Pelt, PA-C  methylPREDNISolone (MEDROL) 4 MG tablet Take as directed 07/17/22   Pete Pelt, PA-C  Multiple Vitamins-Minerals (MULTIVITAMIN WITH MINERALS) tablet Take 1 tablet by mouth daily. 07/07/22   Ngetich, Dinah C, NP  oxyCODONE (OXY IR/ROXICODONE) 5 MG immediate release tablet Take 1 tablet (5 mg total) by mouth every 4 (four) hours as needed (pain). 10/20/22   Judith Part, MD  rosuvastatin (CRESTOR) 20 MG tablet Take 1 tablet (20 mg total) by mouth daily. Begin 09/15/22 08/16/22   Ngetich, Dinah C, NP  silodosin (RAPAFLO) 8 MG CAPS capsule Take 8 mg by mouth daily. 07/18/21   [provider]      Allergies    Patient has no known allergies.    Review of Systems   Review of Systems  Physical Exam Updated Vital Signs BP 133/79   Pulse 86   Temp (!) 100.9 F (38.3 C) (Rectal)   Resp 19   SpO2 92%  Physical Exam Vitals reviewed.  Constitutional:      General: He is not in acute distress.    Appearance: He is ill-appearing.  HENT:     Head: Normocephalic.     Right Ear: External ear normal.     Left Ear: External ear normal.     Nose: Nose normal.     Mouth/Throat:  Pharynx: Oropharynx is clear.  Cardiovascular:     Rate and Rhythm: Normal rate and regular rhythm.     Pulses: Normal pulses.  Pulmonary:     Effort: Pulmonary effort is normal.     Breath sounds: Normal breath sounds.  Abdominal:     General: There is distension.  Musculoskeletal:        General: Swelling present.     Cervical back: Normal range of motion.     Comments: Back incision c.w. recent surgery, some blood oozing at top and bottom Peri-Incisional ecchymosis with some induration noted  Skin:    General: Skin is warm.     Capillary Refill: Capillary refill takes less than 2 seconds.  Neurological:     General: No focal deficit present.     Mental Status: He is alert.  Psychiatric:        Mood and Affect: Mood normal.      ED Results / Procedures / Treatments   Labs (all labs ordered are listed, but only abnormal results are displayed) Labs Reviewed  CBC - Abnormal; Notable for the following components:      Result Value   RBC 2.75 (*)    Hemoglobin 7.8 (*)    HCT 24.3 (*)    All other components within normal limits  BASIC METABOLIC PANEL - Abnormal; Notable for the following components:   Chloride 97 (*)    Glucose, Bld 111 (*)    BUN 33 (*)    Creatinine, Ser 1.52 (*)    GFR, Estimated 48 (*)    All other components within normal limits  PROTIME-INR - Abnormal; Notable for the following components:   Prothrombin Time 16.0 (*)    INR 1.3 (*)    All other components within normal limits  CULTURE, BLOOD (ROUTINE X 2)  CULTURE, BLOOD (ROUTINE X 2)  URINALYSIS, ROUTINE W REFLEX MICROSCOPIC  HEPATIC FUNCTION PANEL  TYPE AND SCREEN    EKG None  Radiology No results found.  Procedures Procedures    Medications Ordered in ED Medications - No data to display  ED Course/ Medical Decision Making/ A&P Clinical Course as of 10/24/22 1531  Tue Oct 24, 2022  1326 Met reviewed and interpreted significant for increased BUN/creatinine with creatinine 1.52 which is increased from previous surgical baseline of 0.8 [DR]  1326 CBC reviewed and interpreted with anemia and hemoglobin of 7.8 which is decreased from presurgical baseline of 12 [DR]  1530 X-Martha Ellerby reviewed interpreted and within normal limits [DR]    Clinical Course User Index [DR] Pattricia Boss, MD                             Medical Decision Making Amount and/or Complexity of Data Reviewed Labs: ordered. Radiology: ordered.    1- bleeding-patient with some bleeding from his incision at post op site Hemoglobin is decreased to 7.8 from baseline of 12 Heart rate is normal at 86 with normal blood pressure 133/79 2 febrile illness patient Temp 100.9 rectally Blood cultures, chest x-Hansika Leaming, urinalysis ordered Discussed with Dr.  Venetia Constable Dr. Venetia Constable has assumed care and is aware of above including fever and studies ordered       Final Clinical Impression(s) / ED Diagnoses Final diagnoses:  Anemia, unspecified type  Febrile illness    Rx / DC Orders ED Discharge Orders     None         Pattricia Boss, MD 10/24/22 1531

## 2022-10-24 NOTE — Consult Note (Signed)
Initial Consultation Note   Patient: Todd Mendoza ZOX:[id number] DOB: [birth year] PCP: Sandrea Hughs, NP DOA: 10/24/2022 DOS: the patient was seen and examined on 10/24/2022 Primary service: Pattricia Boss, MD  Referring physician: Deatra Ina, MD Reason for consult: Medical management  Assessment/Plan: Assessment and Plan:  S/p open lumbar decompression and PSIF   Patient had surgery on 1/25.  Wound was noted to be bleeding acutely this morning and patient and wife, but since being here in the hospital has not had any recurrence of bleeding.  Neurosurgery evaluated the wound and stated that it was and did not appear to show signs of infection. -Per neurosurgery  Acute blood loss anemia Positive stool guaiac Patient presents with hemoglobin down to 7.8 with normal MCV and MCH.  Hemoglobin previously had been 12.4 prior to the surgery on 1/11.  He was noted to have lost a net 300 to 400 cc of blood intraoperatively.  Unclear of patient's blood loss is all related to surgery and this episode of bleeding from his wound.  Patient was typed and screened for possible need of blood products.  Last colonoscopy was from 07/2021 which noted internal and external hemorrhoids, polyps in the rectum hepatic flexure, and ascending colon removed by cold snare.  Also noted to have diverticulosis in the rectosigmoid colon, sigmoid colon, and descending colon. -Hold aspirin -Check stool guaiac (positive) -Clear liquid diet until able to verify no GI source of bleeding, and n.p.o. after midnight -Serial H&H.  Transfuse blood products as needed for hemoglobin less than 7 g/dL -Protonix 40 mg IV twice daily -[facility] consulted and we will see in a.m.  Fever Patient noted to have fever at elevated up to  100.9 F on admission.  Blood and urine cultures have been ordered, but he did not meet any other SIRS criteria and white blood cell count within normal limits.  Urinalysis noted small hemoglobin,  negative nitrites, negative leukocytes, no bacteria seen, 6-10 RBCs/hpf, and 11-20 WBCs.  Low-grade fever could be related with atelectasis. -Follow-up blood and urine cultures -Tylenol as needed  Abdominal distension Patient reports having progressive worsening abdominal distention with reports of constipation.  Question possibility of illeus or partial obstruction. -Check CT scan of the abdomen pelvis(noted no clear signs of any retroperitoneal bleeding, but did note distention of the bladder and renal collecting system without obstructing lesion.)  Possible acute kidney injury Patient presents with creatinine elevated up to 1.52 with BUN 33.  The elevated BUN to creatinine ratio is greater than 20 to suggest the possibility of prerenal cause of symptoms.  Baseline creatinine has been around 0.8-1. -Normal saline IV fluids at 75 mL/h -Hold possible nephrotoxic agents such as hydrochlorothiazide  Essential hypertension Blood pressures were noted to be 122/77 -143/75. -Continue amlodipine as tolerated -Held hydrochlorothiazide due to possible AKI  Hiccups Acute.  Patient reported complaints of hiccups. -Thorazine IV as needed   COPD, without acute exacerbation On physical exam patient without significant wheezing or rhonchi appreciated. -Continue Breo -Albuterol nebs as needed for shortness of breath/wheezing  Elevated liver enzymes History of hepatitis C Acute.  Labs noted AST 145 and ALT 53.  History of hepatitis C reported to have been treated a couple years ago in [facility].  The elevation in patient's liver enzymes could be related with acute drop in blood counts.  Patient denies any reports of drinking alcohol. -Check acute hepatitis panel -Check Doppler ultrasound of the live  BPH, with urinary retention CT scan of the  abdomen pelvis noted bladder distention without obstructing lesion.  Patient had reported being on muscle relaxers. -Discontinued muscle relaxers -Continue  Flomax and Proscar  GERD  -Protonix IV as noted above  [facility] will continue to follow the patient.  HPI: Todd Mendoza is a 72 y.o. male with past medical history of hypertension, hyperlipidemia, hepatitis C s/p treatment, s/p open lumbar decompression and PSIF who presented with complaints of abdominal distention, back pain, and bleeding from his surgical wound.  He had underwent a uncomplicated X9-J4 decompression and PSIF with Dr. Venetia Constable on 1/25.  Following the surgery patient reported that he had been constipated needing to take a stool softener 3 days ago and today to have a bowel movement.  His wife reports that his stool is dark green and had blood present in it when she looked at it from this morning.  The patient's wife makes note that he had significant bleeding from his back wound this morning when they woke up for which the they were not able to get it to stop at home.  He denies any trauma or fall to possibly onset symptoms.  Associated symptoms include subjective fever/chills, difficulty urinating, abdominal distention lower extremity swelling,  hiccups, and poor appetite.  Denies having any nausea, vomiting, or saddle anesthesia.  Patient reports that he had been taking muscle relaxers along with oxycodone for his back pain.  He is not on any blood thinners, but does take a daily 81 mg of aspirin.  Patient denies any use of any NSAIDs and does not drink alcohol.    Patient was noted to be febrile up to 100.9 F vital signs relatively maintained.  Labs noted WBC 8.9, hemoglobin 7.8, BUN 33, creatinine 1.52, AST 145, and total bilirubin 2.2.  Chest x-ray had not noted any acute abnormality.  Blood cultures had been obtained due to the fever.  Dr. Venetia Constable of neurosurgery have been consulted and accepted admit the patient.  Review of Systems: As mentioned in the history of present illness. All other systems reviewed and are negative. Past Medical History:  Diagnosis Date   Arthritis     Asthma    Hepatitis    treated for Hep C in the past   History of substance abuse ([place])    cocaine- over 20 years ago   Hyperlipidemia    Hypertension    Stroke [facility])    Vertigo    Past Surgical History:  Procedure Laterality Date   COLONOSCOPY     LAMINECTOMY WITH POSTERIOR LATERAL ARTHRODESIS LEVEL 4 N/A 10/19/2022   Procedure: Lumbar one to Sacral one Laminectomies with Lumbar one to Sacral one Posterolateral instrumented fusion;  Surgeon: Judith Part, MD;  Location: [place];  Service: Neurosurgery;  Laterality: N/A;   MULTIPLE TOOTH EXTRACTIONS     NO PAST SURGERIES     Social History:  reports that he has quit smoking. His smoking use included cigarettes. He smoked an average of .2 packs per day. He has never used smokeless tobacco. He reports that he does not currently use drugs after having used the following drugs: Cocaine. He reports that he does not drink alcohol.  No Known Allergies  Family History  Problem Relation Age of Onset   Diabetes Father    Colon cancer Neg Hx    Colon polyps Neg Hx    Esophageal cancer Neg Hx    Stomach cancer Neg Hx    Rectal cancer Neg Hx     Prior to Admission medications  Medication Sig Start Date End Date Taking? Authorizing Provider  albuterol (VENTOLIN HFA) 108 (90 Base) MCG/ACT inhaler Inhale 2 puffs into the lungs every 6 (six) hours as needed for wheezing or shortness of breath. 10/04/22   Cobb, Karie Schwalbe, NP  amLODipine (NORVASC) 5 MG tablet TAKE 1 TABLET (5 MG TOTAL) BY MOUTH DAILY. 04/07/22   Ladell Pier, MD  aspirin EC 81 MG tablet Take 1 tablet (81 mg total) by mouth daily. 12/16/12   Othella Boyer, MD  fenofibrate (TRICOR) 145 MG tablet Take 1 tablet (145 mg total) by mouth daily. 07/11/22   Lauree Chandler, NP  finasteride (PROSCAR) 5 MG tablet TAKE 1 TABLET (5 MG TOTAL) BY MOUTH DAILY. 04/07/22   Ladell Pier, MD  fluticasone furoate-vilanterol (BREO ELLIPTA) 100-25 MCG/ACT AEPB Inhale 1 puff into the  lungs daily. 10/04/22   Cobb, Karie Schwalbe, NP  hydrochlorothiazide (HYDRODIURIL) 25 MG tablet TAKE 1 TABLET (25 MG TOTAL) BY MOUTH DAILY. 01/23/22 01/23/23  Passmore, Jake Church I, NP  methocarbamol (ROBAXIN) 500 MG tablet Take 1 tablet (500 mg total) by mouth 4 (four) times daily. 07/17/22   Pete Pelt, PA-C  methylPREDNISolone (MEDROL) 4 MG tablet Take as directed 07/17/22   Pete Pelt, PA-C  Multiple Vitamins-Minerals (MULTIVITAMIN WITH MINERALS) tablet Take 1 tablet by mouth daily. 07/07/22   Ngetich, Dinah C, NP  oxyCODONE (OXY IR/ROXICODONE) 5 MG immediate release tablet Take 1 tablet (5 mg total) by mouth every 4 (four) hours as needed (pain). 10/20/22   Judith Part, MD  rosuvastatin (CRESTOR) 20 MG tablet Take 1 tablet (20 mg total) by mouth daily. Begin 09/15/22 08/16/22   Ngetich, Dinah C, NP  silodosin (RAPAFLO) 8 MG CAPS capsule Take 8 mg by mouth daily. 07/18/21   [provider]    Physical Exam: Vitals:   10/24/22 0916 10/24/22 1151 10/24/22 1306 10/24/22 1315  BP: 122/77 (!) 143/75  133/79  Pulse: 95 89  86  Resp: '20 18  19  '$ Temp: 98.6 F (37 C) 97.9 F (36.6 C) (!) 100.9 F (38.3 C)   TempSrc:   Rectal   SpO2: 96% 92%  92%   Exam  Constitutional: Elderly male currently in no acute distress Eyes: PERRL, lids and conjunctivae normal ENMT: Mucous membranes are moist.   Neck: normal, supple   Respiratory: clear to auscultation bilaterally, no wheezing, no crackles. Normal respiratory effort. No accessory muscle use.  Cardiovascular: Regular rate and rhythm, no murmurs / rubs / gallops. No extremity edema. 2+ pedal pulses. No carotid bruits.  Abdomen: no tenderness, no masses palpated. No hepatosplenomegaly. Bowel sounds positive.  Musculoskeletal: no clubbing / cyanosis. No joint deformity upper and lower extremities. Good ROM, no contractures. Normal muscle tone.  Skin: no rashes, lesions, ulcers. No induration Neurologic: CN 2-12 grossly intact.  Sensation intact, DTR normal. Strength 5/5 in all 4.  Psychiatric: Normal judgment and insight. Alert and oriented x 3. Normal mood.   Data Reviewed:   Reviewed labs, imaging, and pertinent records as noted above in HPI   Family Communication: Wife updated at bedside Primary team communication:  Thank you very much for involving [place] in the care of your patient.  Author: Norval Morton, MD 10/24/2022 2:13 PM  For on call review [email or web address].

## 2022-10-24 NOTE — ED Notes (Signed)
Pt transported to [facility]

## 2022-10-24 NOTE — ED Notes (Signed)
Patient requesting pain medication. Provider notified.

## 2022-10-25 ENCOUNTER — Encounter (HOSPITAL_COMMUNITY): Payer: Self-pay | Admitting: Neurological Surgery

## 2022-10-25 DIAGNOSIS — K567 Ileus, unspecified: Secondary | ICD-10-CM | POA: Diagnosis not present

## 2022-10-25 DIAGNOSIS — K5903 Drug induced constipation: Secondary | ICD-10-CM

## 2022-10-25 LAB — COMPREHENSIVE METABOLIC PANEL
ALT: 48 U/L — ABNORMAL HIGH (ref 0–44)
AST: 106 U/L — ABNORMAL HIGH (ref 15–41)
Albumin: 2.9 g/dL — ABNORMAL LOW (ref 3.5–5.0)
Alkaline Phosphatase: 61 U/L (ref 38–126)
Anion gap: 11 (ref 5–15)
BUN: 19 mg/dL (ref 8–23)
CO2: 27 mmol/L (ref 22–32)
Calcium: 8.8 mg/dL — ABNORMAL LOW (ref 8.9–10.3)
Chloride: 98 mmol/L (ref 98–111)
Creatinine, Ser: 1.43 mg/dL — ABNORMAL HIGH (ref 0.61–1.24)
GFR, Estimated: 52 mL/min — ABNORMAL LOW (ref >60)
Glucose, Bld: 100 mg/dL — ABNORMAL HIGH (ref 70–99)
Potassium: 3.2 mmol/L — ABNORMAL LOW (ref 3.5–5.1)
Sodium: 136 mmol/L (ref 135–145)
Total Bilirubin: 3 mg/dL — ABNORMAL HIGH (ref 0.3–1.2)
Total Protein: 6.7 g/dL (ref 6.5–8.1)

## 2022-10-25 LAB — HEPATITIS PANEL, ACUTE
HCV Ab: REACTIVE — AB
Hep A IgM: NONREACTIVE
Hep B C IgM: NONREACTIVE
Hepatitis B Surface Ag: NONREACTIVE

## 2022-10-25 LAB — HEMOGLOBIN AND HEMATOCRIT, BLOOD
HCT: 22.2 % — ABNORMAL LOW (ref 39.0–52.0)
HCT: 22.5 % — ABNORMAL LOW (ref 39.0–52.0)
HCT: 23.2 % — ABNORMAL LOW (ref 39.0–52.0)
Hemoglobin: 7.4 g/dL — ABNORMAL LOW (ref 13.0–17.0)
Hemoglobin: 7.7 g/dL — ABNORMAL LOW (ref 13.0–17.0)
Hemoglobin: 7.7 g/dL — ABNORMAL LOW (ref 13.0–17.0)

## 2022-10-25 MED ORDER — POTASSIUM CHLORIDE 10 MEQ/100ML IV SOLN
10.0000 meq | INTRAVENOUS | Status: AC
  Administered 2022-10-25 (×4): 10 meq via INTRAVENOUS
  Filled 2022-10-25 (×4): qty 100

## 2022-10-25 MED ORDER — POLYETHYLENE GLYCOL 3350 17 G PO PACK
17.0000 g | PACK | Freq: Two times a day (BID) | ORAL | Status: DC
  Administered 2022-10-25 – 2022-10-27 (×4): 17 g via ORAL
  Filled 2022-10-25 (×4): qty 1

## 2022-10-25 MED ORDER — PANTOPRAZOLE SODIUM 40 MG PO TBEC
40.0000 mg | DELAYED_RELEASE_TABLET | Freq: Two times a day (BID) | ORAL | Status: DC
  Administered 2022-10-25 – 2022-10-27 (×5): 40 mg via ORAL
  Filled 2022-10-25 (×5): qty 1
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
## 2022-10-25 NOTE — TOC Initial Note (Signed)
Transition of Care [facility]) - Initial/Assessment Note    Patient Details  Name: Todd Mendoza MRN: [id number] Date of Birth: [birth year]  [facility] CM/SW Contact:    Pollie Friar, RN Phone Number: 10/25/2022, 2:43 PM  Clinical Narrative:                 Pt is from home with his spouse that is with him all the time.  Wife manages his medications at home and they deny any issues.  Patient does the driving but wife states they can Melburn Popper if needed.  They are interested in having therapies eval while he is here. CM will send a message to MD. TOC following.    Expected Discharge Plan: Home/Self Care Barriers to Discharge: Continued Medical Work up   Patient Goals and CMS Choice            Expected Discharge Plan and Services   Discharge Planning Services: CM Consult   Living arrangements for the past 2 months: Single Family Home                                      Prior Living Arrangements/Services Living arrangements for the past 2 months: Single Family Home Lives with:: Spouse Patient language and need for interpreter reviewed:: Yes          Care giver support system in place?: Yes (comment) Current home services: DME (walker/ cane) Criminal Activity/Legal Involvement Pertinent to Current Situation/Hospitalization: No - Comment as needed  Activities of Daily Living Home Assistive Devices/Equipment: None ADL Screening (condition at time of admission) Patient's cognitive ability adequate to safely complete daily activities?: Yes Is the patient deaf or have difficulty hearing?: No Does the patient have difficulty seeing, even when wearing glasses/contacts?: No Does the patient have difficulty concentrating, remembering, or making decisions?: No Patient able to express need for assistance with ADLs?: Yes Does the patient have difficulty dressing or bathing?: Yes Independently performs ADLs?: Yes (appropriate for developmental age) Does the  patient have difficulty walking or climbing stairs?: No Weakness of Legs: None Weakness of Arms/Hands: None  Permission Sought/Granted                  Emotional Assessment Appearance:: Appears stated age Attitude/Demeanor/Rapport: Engaged Affect (typically observed): Accepting Orientation: : Oriented to Self, Oriented to Place, Oriented to  Time, Oriented to Situation   Psych Involvement: No (comment)  Admission diagnosis:  Ileus ([place]) [K56.7] Febrile illness [R50.9] Anemia, unspecified type [D64.9] Patient Active Problem List   Diagnosis Date Noted   Drug-induced constipation 10/25/2022   Ileus ([place]) 10/24/2022   Acute blood loss anemia 10/24/2022   Stool guaiac positive 10/24/2022   Abdominal distention 10/24/2022   Fever 10/24/2022   AKI (acute kidney injury) ([place]) 10/24/2022   COPD (chronic obstructive pulmonary disease) ([place]) 10/24/2022   Elevated liver enzymes 10/24/2022   BPH (benign prostatic hyperplasia) 10/24/2022   Radiculopathy of lumbar region 10/19/2022   Mild persistent asthma 10/04/2022   Pulmonary nodules 08/23/2022   Chronic right shoulder pain 10/04/2020   Frequent headaches 10/13/2019   Hyperglycemia 10/11/2019   Hepatitis C antibody test positive 06/26/2019   DOE (dyspnea on exertion) 02/13/2019   Overweight (BMI 25.0-29.9) 02/13/2019   Anemia 04/30/2018   ED (erectile dysfunction) 11/03/2016   Lumbar radiculopathy 06/30/2016   Family history of diabetes mellitus 03/14/2016   Encounter for prostate cancer screening 03/14/2016  H/O: CVA (cerebrovascular accident) 12/26/2012   HTN (hypertension) 12/26/2012   HLD (hyperlipidemia) 12/26/2012   Allergic rhinitis 12/26/2012   PCP:  Sandrea Hughs, NP Pharmacy:   [id number] [place] [address] [address]Phone: [phone]: [phone]    Social Determinants of Health (SDOH) Social History: SDOH Screenings   Food  Insecurity: No Food Insecurity (10/24/2022)  Housing: Low Risk  (10/24/2022)  Transportation Needs: No Transportation Needs (10/24/2022)  Utilities: Not At Risk (10/24/2022)  Alcohol Screen: Low Risk  (06/19/2021)  Depression (PHQ2-9): Low Risk  (09/15/2021)  Financial Resource Strain: Low Risk  (06/19/2021)  Physical Activity: Sufficiently Active (06/19/2021)  Social Connections: Moderately Integrated (06/19/2021)  Stress: No Stress Concern Present (03/25/2020)  Tobacco Use: Medium Risk (10/25/2022)   SDOH Interventions:     Readmission Risk Interventions     No data to display

## 2022-10-25 NOTE — Plan of Care (Signed)
  Problem: Activity: Goal: Ability to tolerate increased activity will improve Outcome: Progressing   Problem: Pain Management: Goal: Pain level will decrease Outcome: Progressing   Problem: Health Behavior/Discharge Planning: Goal: Identification of resources available to assist in meeting health care needs will improve Outcome: Progressing   Problem: Education: Goal: Knowledge of General Education information will improve Description: Including pain rating scale, medication(s)/side effects and non-pharmacologic comfort measures Outcome: Progressing   Problem: Activity: Goal: Risk for activity intolerance will decrease Outcome: Progressing   Problem: Skin Integrity: Goal: Risk for impaired skin integrity will decrease Outcome: Progressing

## 2022-10-25 NOTE — Progress Notes (Signed)
PROGRESS NOTE    Todd Mendoza  ASN:[id number] DOB: 1951/02/06 DOA: 10/24/2022 PCP: Sandrea Hughs, NP   Brief Narrative:  HPI: Todd Mendoza is a 72 y.o. male with past medical history of hypertension, hyperlipidemia, hepatitis C s/p treatment, s/p open lumbar decompression and PSIF who presented with complaints of abdominal distention, back pain, and bleeding from his surgical wound.  He had underwent a uncomplicated L9-F7 decompression and PSIF with Dr. Venetia Constable on 1/25.  Following the surgery patient reported that he had been constipated needing to take a stool softener 3 days ago and today to have a bowel movement.  His wife reports that his stool is dark green and had blood present in it when she looked at it from this morning.  The patient's wife makes note that he had significant bleeding from his back wound this morning when they woke up for which the they were not able to get it to stop at home.  He denies any trauma or fall to possibly onset symptoms.  Associated symptoms include subjective fever/chills, difficulty urinating, abdominal distention lower extremity swelling,  hiccups, and poor appetite.  Denies having any nausea, vomiting, or saddle anesthesia.  Patient reports that he had been taking muscle relaxers along with oxycodone for his back pain.  He is not on any blood thinners, but does take a daily 81 mg of aspirin.  Patient denies any use of any NSAIDs and does not drink alcohol.      Patient was noted to be febrile up to 100.9 F vital signs relatively maintained.  Labs noted WBC 8.9, hemoglobin 7.8, BUN 33, creatinine 1.52, AST 145, and total bilirubin 2.2.  Chest x-ray had not noted any acute abnormality.  Blood cultures had been obtained due to the fever.  Dr. Venetia Constable of neurosurgery have been consulted and accepted admit the patient.  Hospitalist were consulted for medical comanagement.  Assessment & Plan:   Principal Problem:   Ileus ([place]) Active Problems:   Acute  blood loss anemia   Stool guaiac positive   Abdominal distention   Fever   HTN (hypertension)   AKI (acute kidney injury) (HCC)   COPD (chronic obstructive pulmonary disease) (HCC)   Elevated liver enzymes   BPH (benign prostatic hyperplasia)  S/p open lumbar decompression and PSIF   Patient had surgery on 1/25.  Wound was noted to be bleeding acutely this morning and patient and wife, but since being here in the hospital has not had any recurrence of bleeding.  Neurosurgery evaluated the wound and stated that it was and did not appear to show signs of infection. -Per neurosurgery   Acute blood loss anemia Positive stool guaiac Patient presents with hemoglobin down to 7.8 with normal MCV and MCH. Hemoglobin previously had been 12.4 prior to the surgery on 1/11.  He was noted to have lost a net 300 to 400 cc of blood intraoperatively.  Unclear of patient's blood loss is all related to surgery and this episode of bleeding from his wound.  Patient was typed and screened for possible need of blood products.  Last colonoscopy was from 07/2021 which noted internal and external hemorrhoids, polyps in the rectum hepatic flexure, and ascending colon removed by cold snare.  Also noted to have diverticulosis in the rectosigmoid colon, sigmoid colon, and descending colon. -Hold aspirin  stool guaiac (positive) -[facility] consulted and we will see in a.m. patient is n.p.o. in anticipation of possible EGD.  Continue Protonix twice daily.   Fever  Patient noted to have fever at elevated up to  100.9 F on admission.  Blood and urine cultures have been ordered, but he did not meet any other SIRS criteria and white blood cell count within normal limits.  Urinalysis noted small hemoglobin, negative nitrites, negative leukocytes, no bacteria seen, 6-10 RBCs/hpf, and 11-20 WBCs.  Low-grade fever could be related with atelectasis.  He is afebrile since 1 PM 10/24/2022.   Abdominal distension Patient reports  having progressive worsening abdominal distention with reports of constipation. CT scan of the abdomen pelvis(noted no clear signs of any retroperitoneal bleeding, but did note distention of the bladder and renal collecting system without obstructing lesion.)   acute kidney injury Patient presents with creatinine elevated up to 1.52 with BUN 33.  The elevated BUN to creatinine ratio is greater than 20 to suggest the possibility of prerenal cause of symptoms.  Baseline creatinine has been around 0.8-1. -Continue normal saline IV fluids at 75 mL/h -Hold possible nephrotoxic agents such as hydrochlorothiazide   Essential hypertension Blood pressures were noted to be 122/77 -143/75. -Continue amlodipine as tolerated -Held hydrochlorothiazide due to possible AKI   COPD, without acute exacerbation On physical exam patient without significant wheezing or rhonchi appreciated. -Continue Breo -Albuterol nebs as needed for shortness of breath/wheezing   Elevated liver enzymes History of hepatitis C Acute.  Labs noted AST 145 and ALT 53.  History of hepatitis C reported to have been treated a couple years ago in [facility].  The elevation in patient's liver enzymes could be related with acute drop in blood counts.  Patient denies any reports of drinking alcohol.  Acute viral hepatitis panel negative.  Ultrasound abdomen negative.   BPH, with urinary retention CT scan of the abdomen pelvis noted bladder distention without obstructing lesion.  Patient had reported being on muscle relaxers. -Discontinued muscle relaxers -Continue Flomax and Proscar   GERD  -Protonix IV as noted above  DVT prophylaxis: SCD's Start: 10/24/22 1557   Code Status: Full Code  Family Communication:  None present at bedside.  Plan of care discussed with patient in length and he/she verbalized understanding and agreed with it.  Status is: Inpatient Remains inpatient appropriate because: Pending GI  assessment.   Estimated body mass index is 30.05 kg/m as calculated from the following:   Height as of this encounter: '5\' 10"'$  (1.778 m).   Weight as of this encounter: 95 kg.    Nutritional Assessment: Body mass index is 30.05 kg/m.Marland Kitchen Seen by dietician.  I agree with the assessment and plan as outlined below: Nutrition Status:        . Skin Assessment: I have examined the patient's skin and I agree with the wound assessment as performed by the wound care RN as outlined below:    Consultants:  GI and TRH  Procedures:  As above  Antimicrobials:  Anti-infectives (From admission, onward)    None         Subjective: Patient seen and examined.  He states that he feeling better, has back pain which is slightly improved.  Also complains of abdominal distention and not passing gas.  No abdominal pain though.  Objective: Vitals:   10/25/22 0000 10/25/22 0319 10/25/22 0600 10/25/22 0745  BP:  122/66  138/72  Pulse:  78  80  Resp: '20 18  16  '$ Temp:    99.6 F (37.6 C)  TempSrc:  Oral  Oral  SpO2:  93%  97%  Weight:   95 kg  Height:        Intake/Output Summary (Last 24 hours) at 10/25/2022 0818 Last data filed at 10/25/2022 0300 Gross per 24 hour  Intake 294.67 ml  Output 250 ml  Net 44.67 ml   Filed Weights   10/24/22 2241 10/25/22 0600  Weight: 95 kg 95 kg    Examination:  General exam: Appears calm and comfortable  Respiratory system: Clear to auscultation. Respiratory effort normal. Cardiovascular system: S1 & S2 heard, RRR. No JVD, murmurs, rubs, gallops or clicks. No pedal edema. Gastrointestinal system: Abdomen is nondistended, soft and nontender. No organomegaly or masses felt. Normal bowel sounds heard. Central nervous system: Alert and oriented. No focal neurological deficits. Extremities: Symmetric 5 x 5 power. Skin: No rashes, lesions or ulcers Psychiatry: Judgement and insight appear normal. Mood & affect appropriate.    Data Reviewed: I  have personally reviewed following labs and imaging studies  CBC: Recent Labs  Lab 10/24/22 0515 10/24/22 1800 10/25/22 0001 10/25/22 0533  WBC 8.9  --   --   --   HGB 7.8* 7.6* 7.7* 7.7*  HCT 24.3* 22.7* 22.2* 23.2*  MCV 88.4  --   --   --   PLT 234  --   --   --    Basic Metabolic Panel: Recent Labs  Lab 10/24/22 0515 10/25/22 0533  NA 138 136  K 3.5 3.2*  CL 97* 98  CO2 30 27  GLUCOSE 111* 100*  BUN 33* 19  CREATININE 1.52* 1.43*  CALCIUM 9.1 8.8*   GFR: Estimated Creatinine Clearance: 54 mL/min (A) (by C-G formula based on SCr of 1.43 mg/dL (H)). Liver Function Tests: Recent Labs  Lab 10/24/22 1237 10/25/22 0533  AST 145* 106*  ALT 53* 48*  ALKPHOS 63 61  BILITOT 2.2* 3.0*  PROT 7.2 6.7  ALBUMIN 3.1* 2.9*   No results for input(s): "LIPASE", "AMYLASE" in the last 168 hours. No results for input(s): "AMMONIA" in the last 168 hours. Coagulation Profile: Recent Labs  Lab 10/24/22 1237  INR 1.3*   Cardiac Enzymes: No results for input(s): "CKTOTAL", "CKMB", "CKMBINDEX", "TROPONINI" in the last 168 hours. BNP (last 3 results) No results for input(s): "PROBNP" in the last 8760 hours. HbA1C: No results for input(s): "HGBA1C" in the last 72 hours. CBG: No results for input(s): "GLUCAP" in the last 168 hours. Lipid Profile: No results for input(s): "CHOL", "HDL", "LDLCALC", "TRIG", "CHOLHDL", "LDLDIRECT" in the last 72 hours. Thyroid Function Tests: No results for input(s): "TSH", "T4TOTAL", "FREET4", "T3FREE", "THYROIDAB" in the last 72 hours. Anemia Panel: No results for input(s): "VITAMINB12", "FOLATE", "FERRITIN", "TIBC", "IRON", "RETICCTPCT" in the last 72 hours. Sepsis Labs: No results for input(s): "PROCALCITON", "LATICACIDVEN" in the last 168 hours.  Recent Results (from the past 240 hour(s))  Blood culture (routine x 2)     Status: None (Preliminary result)   Collection Time: 10/24/22 12:24 PM   Specimen: BLOOD  Result Value Ref Range Status    Specimen Description BLOOD LEFT ANTECUBITAL  Final   Special Requests   Final    BOTTLES DRAWN AEROBIC AND ANAEROBIC Blood Culture adequate volume   Culture   Final    NO GROWTH < 24 HOURS Performed at [facility], [address] [place], [place] [address]    Report Status PENDING  Incomplete  Blood culture (routine x 2)     Status: None (Preliminary result)   Collection Time: 10/24/22 12:37 PM   Specimen: BLOOD  Result Value Ref Range  Status   Specimen Description BLOOD RIGHT ANTECUBITAL  Final   Special Requests   Final    BOTTLES DRAWN AEROBIC AND ANAEROBIC Blood Culture adequate volume   Culture   Final    NO GROWTH < 24 HOURS Performed at [facility], [address] [place], [place] [address]    Report Status PENDING  Incomplete     Radiology Studies: US ABDOMEN LIMITED WITH LIVER DOPPLER  Result Date: 10/24/2022 CLINICAL DATA:  Elevated LFTs EXAM: ULTRASOUND ABDOMEN LIMITED RIGHT UPPER QUADRANT DUPLEX ULTRASOUND OF LIVER TECHNIQUE: Limited abdominal ultrasound of the right upper abdomen. Color and duplex Doppler ultrasound was performed to evaluate the hepatic in-flow and out-flow vessels. COMPARISON:  None Available. FINDINGS: Liver: Normal parenchymal echogenicity. Normal hepatic contour without nodularity. No focal lesion, mass or intrahepatic biliary ductal dilatation. Gallbladder: No gallstones or wall thickening visualized. No sonographic Murphy sign noted by sonographer. Common bile duct: Diameter: 4 mm Main Portal Vein size: 0.7 cm Portal Vein Velocities Main Prox:  42 cm/sec Main Mid: 33 cm/sec Main Dist:  32 cm/sec Right: 28 cm/sec Left: 18 cm/sec Hepatic Vein Velocities Right:  18 cm/sec Middle:  33 cm/sec Left:  26 cm/sec IVC: Present and patent with normal respiratory phasicity. Hepatic Artery Velocity:  94 cm/sec Splenic Vein Velocity:  18 cm/sec Spleen: 9.9 cm x 4.0 cm x 3.4 cm with a total volume of 70 cm^3 (411 cm^3 is upper limit normal) Portal Vein  Occlusion/Thrombus: No Splenic Vein Occlusion/Thrombus: No Ascites: None Varices: None IMPRESSION: Negative right upper quadrant ultrasound. Normal Doppler evaluation of the hepatic vasculature. Electronically Signed   By: Julian Hy M.D.   On: 10/24/2022 21:45   DG Abd 1 View  Result Date: 10/24/2022 CLINICAL DATA:  Abdominal distension EXAM: ABDOMEN - 1 VIEW COMPARISON:  CT abdomen pelvis 10/24/2022 FINDINGS: Nonobstructive bowel gas pattern. No bowel dilatation or bowel wall thickening. Lung bases clear. Degenerative and postsurgical changes lumbar spine. No urinary tract calcification. IMPRESSION: No acute abnormalities. Electronically Signed   By: Lavonia Dana M.D.   On: 10/24/2022 16:38   CT ABDOMEN PELVIS WO CONTRAST  Result Date: 10/24/2022 CLINICAL DATA:  Postoperative abdominal pain. Laminectomy. Bleeding. EXAM: CT ABDOMEN AND PELVIS WITHOUT CONTRAST TECHNIQUE: Multidetector CT imaging of the abdomen and pelvis was performed following the standard protocol without IV contrast. RADIATION DOSE REDUCTION: This exam was performed according to the departmental dose-optimization program which includes automated exposure control, adjustment of the mA and/or kV according to patient size and/or use of iterative reconstruction technique. COMPARISON:  None Available. FINDINGS: Lower chest: Mild atelectasis at the right lung base. No pleural fluid. Hepatobiliary: Liver parenchyma is normal without contrast. No calcified gallstones. Pancreas: Normal Spleen: Normal Adrenals/Urinary Tract: Adrenal glands are normal. Kidneys are normal. No evidence of mass or stone. Mild fullness of the renal collecting systems and ureters, possibly subsequent to a distended bladder. Stomach/Bowel: Stomach and small intestine are normal. Normal appendix. Normal colon. Vascular/Lymphatic: Aortic atherosclerosis. No aneurysm. IVC is normal. No adenopathy. Reproductive: Normal Other: No free fluid or air.  No hernia.  Musculoskeletal: Previous lumbosacral fusion M L1 to the sacrum. Pedicle screws and posterior rods. Posterior decompression throughout the region. The spinal canal cannot be accurately assessed using this technique. No evidence of retroperitoneal bleeding. IMPRESSION: 1. Previous lumbosacral fusion from L1 to the sacrum with posterior decompression. The spinal canal cannot be accurately assessed using this technique. No evidence of retroperitoneal bleeding. 2. Mild fullness of the renal collecting systems  and ureters, possibly subsequent to a distended bladder. No obstructing lesion is seen to explain the distended bladder. The prostate gland is not markedly enlarged. 3. Aortic atherosclerosis. 4. Mild atelectasis at the right lung base. Aortic Atherosclerosis (ICD10-I70.0). Electronically Signed   By: Nelson Chimes M.D.   On: 10/24/2022 15:04   DG Chest Port 1 View  Result Date: 10/24/2022 CLINICAL DATA:  Fever. EXAM: PORTABLE CHEST 1 VIEW COMPARISON:  Chest x-ray Feb 13, 2019. FINDINGS: Similar cardiomediastinal silhouette. No consolidation. No visible pleural effusions or pneumothorax. IMPRESSION: No active disease. Electronically Signed   By: Margaretha Sheffield M.D.   On: 10/24/2022 14:12    Scheduled Meds:  amLODipine  5 mg Oral Daily   docusate sodium  100 mg Oral BID   fenofibrate  160 mg Oral Daily   finasteride  5 mg Oral Daily   fluticasone furoate-vilanterol  1 puff Inhalation Daily   pantoprazole (PROTONIX) IV  40 mg Intravenous Q12H   rosuvastatin  20 mg Oral Daily   tamsulosin  0.4 mg Oral Daily   Continuous Infusions:  sodium chloride 75 mL/hr at 10/25/22 0300   chlorproMAZINE (THORAZINE) 25 mg in sodium chloride 0.9 % 25 mL IVPB     potassium chloride       LOS: 1 day   Darliss Cheney, MD [facility]  10/25/2022, 8:18 AM   *Please note that this is a verbal dictation therefore any spelling or grammatical errors are due to the "[employer]" system  interpretation.  Please page via [place] and do not message via secure chat for urgent patient care matters. Secure chat can be used for non urgent patient care matters.  How to contact the [facility] Attending or Consulting provider [place] or covering provider during after hours [place], for this patient?  Check the care team in [facility] and look for a) attending/consulting TR[facility] provider listed and b) the [facility] team listed. Page or secure chat [phone]. Log into [email or web address] and use [place] universal password to access. If you do not have the password, please contact the hospital operator. Locate the [facility] provider you are looking for under [facility] and page to a number that you can be directly reached. If you still have difficulty reaching the provider, please page the [facility] (Director on Call) for the Hospitalists listed on amion for assistance.

## 2022-10-25 NOTE — Progress Notes (Signed)
Neurosurgery Service Progress Note  Subjective: No acute events overnight, some abdominal pain, but has an appetite, back pain stable, some small / scant bloody drainage on the dressing this morning   Objective: Vitals:   10/25/22 0319 10/25/22 0600 10/25/22 0745 10/25/22 0826  BP: 122/66  138/72   Pulse: 78  80 80  Resp: '18  16 16  '$ Temp:   99.6 F (37.6 C)   TempSrc: Oral  Oral   SpO2: 93%  97%   Weight:  95 kg    Height:        Physical Exam: Strength 5/5 x4 and SILTx4   Assessment & Plan: 72 y.o. man s/p L1-S1 lami / fusion, post-op readmission for anemia / abdominal pain, fever, transaminitis with hyperbilirubinemia, CT A/P hardware in good position, no concerning findings in the lumbar spine.  -medicine recs, GI recs - likely upper endoscopy tomorrow  Judith Part  10/25/22 9:06 AM
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
## 2022-10-26 ENCOUNTER — Encounter (HOSPITAL_COMMUNITY): Payer: Self-pay | Admitting: Neurological Surgery

## 2022-10-26 ENCOUNTER — Inpatient Hospital Stay (HOSPITAL_COMMUNITY): Payer: Medicare Other | Admitting: Certified Registered Nurse Anesthetist

## 2022-10-26 ENCOUNTER — Encounter (HOSPITAL_COMMUNITY)
Admission: EM | Disposition: A | Discharge status: Home / Self Care | Payer: Self-pay | Source: Home / Self Care | Attending: Neurological Surgery

## 2022-10-26 DIAGNOSIS — Z87891 Personal history of nicotine dependence: Secondary | ICD-10-CM

## 2022-10-26 DIAGNOSIS — I10 Essential (primary) hypertension: Secondary | ICD-10-CM

## 2022-10-26 DIAGNOSIS — K3189 Other diseases of stomach and duodenum: Secondary | ICD-10-CM

## 2022-10-26 DIAGNOSIS — K2100 Gastro-esophageal reflux disease with esophagitis, without bleeding: Secondary | ICD-10-CM

## 2022-10-26 DIAGNOSIS — K209 Esophagitis, unspecified without bleeding: Secondary | ICD-10-CM

## 2022-10-26 DIAGNOSIS — B3781 Candidal esophagitis: Secondary | ICD-10-CM

## 2022-10-26 DIAGNOSIS — R066 Hiccough: Secondary | ICD-10-CM

## 2022-10-26 HISTORY — PX: ESOPHAGOGASTRODUODENOSCOPY (EGD) WITH PROPOFOL: SHX5813

## 2022-10-26 HISTORY — PX: BIOPSY: SHX5522

## 2022-10-26 LAB — HEMOGLOBIN AND HEMATOCRIT, BLOOD
HCT: 24 % — ABNORMAL LOW (ref 39.0–52.0)
HCT: 22.5 % — ABNORMAL LOW (ref 39.0–52.0)
Hemoglobin: 8.1 g/dL — ABNORMAL LOW (ref 13.0–17.0)
Hemoglobin: 7.7 g/dL — ABNORMAL LOW (ref 13.0–17.0)

## 2022-10-26 LAB — HCV RNA QUANT: HCV Quantitative: NOT DETECTED IU/mL (ref >50)

## 2022-10-26 SURGERY — ESOPHAGOGASTRODUODENOSCOPY (EGD) WITH PROPOFOL
Anesthesia: Monitor Anesthesia Care

## 2022-10-26 MED ORDER — LIDOCAINE 2% (20 MG/ML) 5 ML SYRINGE
INTRAMUSCULAR | Status: DC | PRN
  Administered 2022-10-26: 100 mg via INTRAVENOUS

## 2022-10-26 MED ORDER — FLUCONAZOLE 200 MG PO TABS
400.0000 mg | ORAL_TABLET | Freq: Once | ORAL | Status: AC
  Administered 2022-10-26: 400 mg via ORAL
  Filled 2022-10-26: qty 2

## 2022-10-26 MED ORDER — PROPOFOL 10 MG/ML IV BOLUS
INTRAVENOUS | Status: DC | PRN
  Administered 2022-10-26: 50 mg via INTRAVENOUS
  Administered 2022-10-26: 10 mg via INTRAVENOUS
  Administered 2022-10-26: 40 mg via INTRAVENOUS

## 2022-10-26 MED ORDER — HYDROMORPHONE HCL 1 MG/ML IJ SOLN: 0.2500 mg | INTRAMUSCULAR | Status: DC | PRN

## 2022-10-26 MED ORDER — PROMETHAZINE HCL 25 MG/ML IJ SOLN: 6.2500 mg | INTRAMUSCULAR | Status: DC | PRN

## 2022-10-26 MED ORDER — BISACODYL 10 MG RE SUPP
10.0000 mg | Freq: Once | RECTAL | Status: AC
  Administered 2022-10-26: 10 mg via RECTAL
  Filled 2022-10-26: qty 1

## 2022-10-26 MED ORDER — PROPOFOL 500 MG/50ML IV EMUL
INTRAVENOUS | Status: DC | PRN
  Administered 2022-10-26: 100 ug/kg/min via INTRAVENOUS

## 2022-10-26 MED ORDER — OXYCODONE HCL 5 MG/5ML PO SOLN: 5.0000 mg | Freq: Once | ORAL | Status: DC | PRN

## 2022-10-26 MED ORDER — PHENYLEPHRINE 80 MCG/ML (10ML) SYRINGE FOR IV PUSH (FOR BLOOD PRESSURE SUPPORT)
PREFILLED_SYRINGE | INTRAVENOUS | Status: DC | PRN
  Administered 2022-10-26 (×2): 160 ug via INTRAVENOUS

## 2022-10-26 MED ORDER — OXYCODONE HCL 5 MG PO TABS: 5.0000 mg | ORAL_TABLET | Freq: Once | ORAL | Status: DC | PRN

## 2022-10-26 MED ORDER — FLUCONAZOLE 200 MG PO TABS
200.0000 mg | ORAL_TABLET | Freq: Every day | ORAL | Status: DC
  Administered 2022-10-27: 200 mg via ORAL
  Filled 2022-10-26: qty 1

## 2022-10-26 SURGICAL SUPPLY — 15 items

## 2022-10-26 NOTE — Anesthesia Preprocedure Evaluation (Signed)
Anesthesia Evaluation  Patient identified by MRN, date of birth, ID band Patient awake    Reviewed: Allergy & Precautions, NPO status , Patient's Chart, lab work & pertinent test results  History of Anesthesia Complications Negative for: history of anesthetic complications  Airway Mallampati: I  TM Distance: >3 FB Neck ROM: Full    Dental  (+) Edentulous Upper, Edentulous Lower   Pulmonary asthma , COPD (daily inhaler),  COPD inhaler, former smoker   breath sounds clear to auscultation       Cardiovascular hypertension, Pt. on medications (-) angina + DOE   Rhythm:Regular Rate:Normal     Neuro/Psych  Headaches CVA, No Residual Symptoms    GI/Hepatic negative GI ROS, Neg liver ROS,,,  Endo/Other  BMI 30  Renal/GU negative Renal ROS     Musculoskeletal   Abdominal  (+) + obese  Peds  Hematology  (+) Blood dyscrasia, anemia   Anesthesia Other Findings   Reproductive/Obstetrics                             Anesthesia Physical Anesthesia Plan  ASA: 3  Anesthesia Plan: MAC   Post-op Pain Management: Minimal or no pain anticipated   Induction: Intravenous  PONV Risk Score and Plan: 2 and Ondansetron, Midazolam and Treatment may vary due to age or medical condition  Airway Management Planned: Nasal Cannula  Additional Equipment: None  Intra-op Plan:   Post-operative Plan:   Informed Consent: I have reviewed the patients History and Physical, chart, labs and discussed the procedure including the risks, benefits and alternatives for the proposed anesthesia with the patient or authorized representative who has indicated his/her understanding and acceptance.       Plan Discussed with: CRNA and Surgeon  Anesthesia Plan Comments:         Anesthesia Quick Evaluation

## 2022-10-26 NOTE — Op Note (Signed)
[facility] Patient Name: Todd Mendoza Procedure Date : 10/26/2022 MRN: [id number] Attending MD: Jerene Bears , MD, [id number] Date of Birth: 1950-11-26 CSN: [id number] Age: 72 Admit Type: Inpatient Procedure:                Upper GI endoscopy Indications:              Abdominal bloating, Eructation, hiccups, heme +                            stools Providers:                Lajuan Lines. Hilarie Fredrickson, MD, Jaci Carrel, RN, Brien Mates, Technician Referring MD:             [facility] Medicines:                Monitored Anesthesia Care Complications:            No immediate complications. Estimated Blood Loss:     Estimated blood loss was minimal. Procedure:                Pre-Anesthesia Assessment:                           - Prior to the procedure, a History and Physical                            was performed, and patient medications and                            allergies were reviewed. The patient's tolerance of                            previous anesthesia was also reviewed. The risks                            and benefits of the procedure and the sedation                            options and risks were discussed with the patient.                            All questions were answered, and informed consent                            was obtained. Prior Anticoagulants: The patient has                            taken no anticoagulant or antiplatelet agents. ASA                            Grade Assessment: II - A patient with mild systemic  disease. After reviewing the risks and benefits,                            the patient was deemed in satisfactory condition to                            undergo the procedure.                           After obtaining informed consent, the endoscope was                            passed under direct vision. Throughout the                             procedure, the patient's blood pressure, pulse, and                            oxygen saturations were monitored continuously. The                            [id number]-[id number] ([id number]) Olympus endoscope was introduced                            through the mouth, and advanced to the second part                            of duodenum. The upper GI endoscopy was                            accomplished without difficulty. The patient                            tolerated the procedure well. Scope In: Scope Out: Findings:      Moderately severe esophagitis with no bleeding was found in the lower       third of the esophagus. Biopsies were taken with a cold forceps for       histology.      Diffuse mildly congested mucosa was found in the gastric fundus and in       the gastric body. Biopsies were taken with a cold forceps for histology       and Helicobacter pylori testing.      The examined duodenum was normal. Impression:               - Moderately severe reflux and candidiasis                            esophagitis with no bleeding. Rule out Barrett's                            esophagus. Biopsied.                           - Congestive gastropathy. Biopsied.                           -  Normal examined duodenum. Moderate Sedation:      N/A Recommendation:           - Return patient to hospital ward for ongoing care.                           - Advance diet as tolerated.                           - Continue present medications. BID PO PPI                            recommended x 8 weeks, then once daily thereafter.                            Fluconazole 400 mg x 1 day, 200 mg x 13 days.                           - Await pathology results.                           - Expect hiccups related to reflux exacerbated by                            decreased gastric and intestinal motility in                            post-operative setting with narcotic pain                            medications.                            - Patient is up to date with colonoscopy.                            Predominant blood loss secondary to surgery and                            incisional bleeding.                           - GI will sign off, call if questions. Procedure Code(s):        --- Professional ---                           [phone], Esophagogastroduodenoscopy, flexible,                            transoral; with biopsy, single or multiple Diagnosis Code(s):        --- Professional ---                           K21.00, Gastro-esophageal reflux disease with  esophagitis, without bleeding                           B37.81, Candidal esophagitis                           K31.89, Other diseases of stomach and duodenum                           R14.0, Abdominal distension (gaseous)                           R14.2, Eructation CPT copyright 2022 American Medical Association. All rights reserved. The codes documented in this report are preliminary and upon coder review may  be revised to meet current compliance requirements. Jerene Bears, MD 10/26/2022 10:44:58 AM This report has been signed electronically. Number of Addenda: 0

## 2022-10-26 NOTE — Interval H&P Note (Signed)
History and Physical Interval Note: EGD today to eval hiccups, GERD like symptoms, heme positive stool The nature of the procedure, as well as the risks, benefits, and alternatives were carefully and thoroughly reviewed with the patient. Ample time for discussion and questions allowed. The patient understood, was satisfied, and agreed to proceed.      Latest Ref Rng & Units 10/26/2022    7:22 AM 10/25/2022    5:27 PM 10/25/2022    5:33 AM  CBC  Hemoglobin 13.0 - 17.0 g/dL 8.1  7.4  7.7   Hematocrit 39.0 - 52.0 % 24.0  22.5  23.2      10/26/2022 9:59 AM  Todd Mendoza  has presented today for surgery, with the diagnosis of hiccups, abdominal bloating, FOBT +. using narcotics since back surgery last week..  The various methods of treatment have been discussed with the patient and family. After consideration of risks, benefits and other options for treatment, the patient has consented to  Procedure(s): ESOPHAGOGASTRODUODENOSCOPY (EGD) WITH PROPOFOL (N/A) as a surgical intervention.  The patient's history has been reviewed, patient examined, no change in status, stable for surgery.  I have reviewed the patient's chart and labs.  Questions were answered to the patient's satisfaction.     Todd Mendoza

## 2022-10-26 NOTE — Procedures (Addendum)
I attempted to reach the patient's wife by phone.  I reached her voicemail.  I did leave her a message with the findings from the upper endoscopy as well as treatment plan.

## 2022-10-26 NOTE — Transfer of Care (Signed)
Immediate Anesthesia Transfer of Care Note  Patient: Todd Mendoza  Procedure(s) Performed: ESOPHAGOGASTRODUODENOSCOPY (EGD) WITH PROPOFOL BIOPSY  Patient Location: PACU  Anesthesia Type:MAC  Level of Consciousness: awake and alert   Airway & Oxygen Therapy: Patient Spontanous Breathing and Patient connected to nasal cannula oxygen  Post-op Assessment: Report given to RN and Post -op Vital signs reviewed and stable  Post vital signs: Reviewed and stable  Last Vitals:  Vitals Value Taken Time  BP 107/65 10/26/22 1047  Temp    Pulse 78 10/26/22 1050  Resp 14 10/26/22 1050  SpO2 98 % 10/26/22 1050  Vitals shown include unvalidated device data.  Last Pain:  Vitals:   10/26/22 0800  TempSrc: Temporal  PainSc: 0-No pain      Patients Stated Pain Goal: 5 (65/99/35 7017)  Complications: No notable events documented.

## 2022-10-26 NOTE — Progress Notes (Signed)
Neurosurgery Service Progress Note  Subjective: No acute events overnight, in the GI suite getting scoped this morning on rounds  Objective: Vitals:   10/25/22 2307 10/26/22 0310 10/26/22 0725 10/26/22 0800  BP: 126/70 119/83 132/73 137/82  Pulse: 74 78 89 79  Resp: '18 18 20 16  '$ Temp: 98.9 F (37.2 C) 98.1 F (36.7 C) 99 F (37.2 C) 98.5 F (36.9 C)  TempSrc: Oral Oral Oral Temporal  SpO2: 98% 98% 97% 95%  Weight:      Height:        Physical Exam: Pt off the floor  Assessment & Plan: 72 y.o. man s/p L1-S1 lami / fusion, post-op readmission for anemia / abdominal pain, fever, transaminitis with hyperbilirubinemia, CT A/P hardware in good position, no concerning findings in the lumbar spine.  -medicine recs and GI recs  Judith Part  10/26/22 10:25 AM

## 2022-10-26 NOTE — Anesthesia Postprocedure Evaluation (Signed)
Anesthesia Post Note  Patient: Todd Mendoza  Procedure(s) Performed: ESOPHAGOGASTRODUODENOSCOPY (EGD) WITH PROPOFOL BIOPSY     Patient location during evaluation: PACU Anesthesia Type: MAC Level of consciousness: awake and alert Pain management: pain level controlled Vital Signs Assessment: post-procedure vital signs reviewed and stable Respiratory status: spontaneous breathing, nonlabored ventilation and respiratory function stable Cardiovascular status: blood pressure returned to baseline and stable Postop Assessment: no apparent nausea or vomiting Anesthetic complications: no   No notable events documented.  Last Vitals:  Vitals:   10/26/22 1105 10/26/22 1146  BP: 117/73 (P) 127/74  Pulse: 80 (P) 82  Resp: 15 (P) 16  Temp: 37.3 C (P) 37.6 C  SpO2: 94% (P) 97%    Last Pain:  Vitals:   10/26/22 1200  TempSrc:   PainSc: 0-No pain                 Lynda Rainwater

## 2022-10-26 NOTE — Progress Notes (Signed)
PROGRESS NOTE    Todd Mendoza  YKD:[id number] DOB: 11/30/1950 DOA: 10/24/2022 PCP: Sandrea Hughs, NP   Brief Narrative:  HPI: Todd Mendoza is a 72 y.o. male with past medical history of hypertension, hyperlipidemia, hepatitis C s/p treatment, s/p open lumbar decompression and PSIF who presented with complaints of abdominal distention, back pain, and bleeding from his surgical wound.  He had underwent a uncomplicated L9-J6 decompression and PSIF with Dr. Venetia Constable on 1/25.  Following the surgery patient reported that he had been constipated needing to take a stool softener 3 days ago and today to have a bowel movement.  His wife reports that his stool is dark green and had blood present in it when she looked at it from this morning.  The patient's wife makes note that he had significant bleeding from his back wound this morning when they woke up for which the they were not able to get it to stop at home.  He denies any trauma or fall to possibly onset symptoms.  Associated symptoms include subjective fever/chills, difficulty urinating, abdominal distention lower extremity swelling,  hiccups, and poor appetite.  Denies having any nausea, vomiting, or saddle anesthesia.  Patient reports that he had been taking muscle relaxers along with oxycodone for his back pain.  He is not on any blood thinners, but does take a daily 81 mg of aspirin.  Patient denies any use of any NSAIDs and does not drink alcohol.      Patient was noted to be febrile up to 100.9 F vital signs relatively maintained.  Labs noted WBC 8.9, hemoglobin 7.8, BUN 33, creatinine 1.52, AST 145, and total bilirubin 2.2.  Chest x-ray had not noted any acute abnormality.  Blood cultures had been obtained due to the fever.  Dr. Venetia Constable of neurosurgery have been consulted and accepted admit the patient.  Hospitalist were consulted for medical comanagement.  Assessment & Plan:   Principal Problem:   Ileus ([place]) Active Problems:   Acute  blood loss anemia   Stool guaiac positive   Abdominal distention   Fever   HTN (hypertension)   AKI (acute kidney injury) (HCC)   COPD (chronic obstructive pulmonary disease) (HCC)   Elevated liver enzymes   BPH (benign prostatic hyperplasia)   Drug-induced constipation   Hiccups   Acute esophagitis   Esophageal candidiasis (HCC)  S/p open lumbar decompression and PSIF   Patient had surgery on 1/25.  Wound was noted to be bleeding acutely this morning and patient and wife, but since being here in the hospital has not had any recurrence of bleeding.  Neurosurgery evaluated the wound and stated that it was and did not appear to show signs of infection. -Per neurosurgery   Acute blood loss anemia Positive stool guaiac/GERD/esophageal candidiasis Patient presents with hemoglobin down to 7.8 with normal MCV and MCH. Hemoglobin previously had been 12.4 prior to the surgery on 1/11.  He was noted to have lost a net 300 to 400 cc of blood intraoperatively.  Unclear of patient's blood loss is all related to surgery and this episode of bleeding from his wound.  Patient was typed and screened for possible need of blood products.  Last colonoscopy was from 07/2021 which noted internal and external hemorrhoids, polyps in the rectum hepatic flexure, and ascending colon removed by cold snare.  Also noted to have diverticulosis in the rectosigmoid colon, sigmoid colon, and descending colon. -Hold aspirin  stool guaiac (positive).  GI consulted, patient underwent EGD 10/26/2022 was  found to have GERD and esophageal candidiasis, started on fluconazole and PPI twice daily for 8 weeks.   Fever Patient noted to have fever at elevated up to  100.9 F on admission.  Blood and urine cultures have been ordered, but he did not meet any other SIRS criteria and white blood cell count within normal limits.  Urinalysis noted small hemoglobin, negative nitrites, negative leukocytes, no bacteria seen, 6-10 RBCs/hpf, and  11-20 WBCs.  Low-grade fever could be related with atelectasis.  He is afebrile since 1 PM 10/24/2022.   Abdominal distension-constipation Patient reports having progressive worsening abdominal distention with reports of constipation. CT scan of the abdomen pelvis(noted no clear signs of any retroperitoneal bleeding, but did note distention of the bladder and renal collecting system without obstructing lesion.).  Patient has not had any bowel movement since last 5 days.  Will try suppository, if failed, will try soapsuds.   acute kidney injury Patient presents with creatinine elevated up to 1.52 with BUN 33.  The elevated BUN to creatinine ratio is greater than 20 to suggest the possibility of prerenal cause of symptoms.  Baseline creatinine has been around 0.8-1. -Continue normal saline IV fluids at 75 mL/h, repeat labs in the morning. -Hold possible nephrotoxic agents such as hydrochlorothiazide   Essential hypertension Blood pressures were noted to be 122/77 -143/75. -Continue amlodipine as tolerated -Held hydrochlorothiazide due to possible AKI   COPD, without acute exacerbation On physical exam patient without significant wheezing or rhonchi appreciated. -Continue Breo -Albuterol nebs as needed for shortness of breath/wheezing   Elevated liver enzymes History of hepatitis C Acute.  Labs noted AST 145 and ALT 53.  History of hepatitis C reported to have been treated a couple years ago in [facility].  The elevation in patient's liver enzymes could be related with acute drop in blood counts.  Patient denies any reports of drinking alcohol.  Acute viral hepatitis panel negative.  Ultrasound abdomen negative.   BPH, with urinary retention CT scan of the abdomen pelvis noted bladder distention without obstructing lesion.  Patient had reported being on muscle relaxers. -Discontinued muscle relaxers -Continue Flomax and Proscar   DVT prophylaxis: SCD's Start: 10/24/22 1557   Code Status:  Full Code  Family Communication: Wife present at bedside.  Plan of care discussed with patient in length and he/she verbalized understanding and agreed with it.  Status is: Inpatient Remains inpatient appropriate because: Still with constipation and urinary retention.  Potential discharge tomorrow.   Estimated body mass index is 30.05 kg/m as calculated from the following:   Height as of this encounter: '5\' 10"'$  (1.778 m).   Weight as of this encounter: 95 kg.    Nutritional Assessment: Body mass index is 30.05 kg/m.Marland Kitchen Seen by dietician.  I agree with the assessment and plan as outlined below: Nutrition Status:        . Skin Assessment: I have examined the patient's skin and I agree with the wound assessment as performed by the wound care RN as outlined below:    Consultants:  GI and TRH  Procedures:  As above  Antimicrobials:  Anti-infectives (From admission, onward)    Start     Dose/Rate Route Frequency Ordered Stop   10/27/22 1000  fluconazole (DIFLUCAN) tablet 200 mg        200 mg Oral Daily 10/26/22 1054 11/09/22 0959   10/26/22 1100  fluconazole (DIFLUCAN) tablet 400 mg        400 mg Oral  Once  10/26/22 1054 10/26/22 1314         Subjective:  Patient seen and examined after the EGD.  Wife at the bedside.  Patient still complains of mild distention and constipation and also had 1 episode of urinary retention.  Objective: Vitals:   10/26/22 1105 10/26/22 1146 10/26/22 1345 10/26/22 1521  BP: 117/73 127/74  130/75  Pulse: 80 82  86  Resp: '15 16  18  '$ Temp: 99.2 F (37.3 C) 99.6 F (37.6 C)  98 F (36.7 C)  TempSrc:  Oral  Oral  SpO2: 94% 97% 92% 96%  Weight:      Height:        Intake/Output Summary (Last 24 hours) at 10/26/2022 1526 Last data filed at 10/26/2022 1300 Gross per 24 hour  Intake 1380 ml  Output 2202 ml  Net -822 ml    Filed Weights   10/24/22 2241 10/25/22 0600  Weight: 95 kg 95 kg    Examination:  General exam: Appears calm  and comfortable  Respiratory system: Clear to auscultation. Respiratory effort normal. Cardiovascular system: S1 & S2 heard, RRR. No JVD, murmurs, rubs, gallops or clicks. No pedal edema. Gastrointestinal system: Abdomen is distended, soft and nontender. No organomegaly or masses felt. Normal bowel sounds heard. Central nervous system: Alert and oriented. No focal neurological deficits. Extremities: Symmetric 5 x 5 power. Skin: No rashes, lesions or ulcers.  Psychiatry: Judgement and insight appear normal. Mood & affect appropriate.    Data Reviewed: I have personally reviewed following labs and imaging studies  CBC: Recent Labs  Lab 10/24/22 0515 10/24/22 1800 10/25/22 0001 10/25/22 0533 10/25/22 1727 10/26/22 0722  WBC 8.9  --   --   --   --   --   HGB 7.8* 7.6* 7.7* 7.7* 7.4* 8.1*  HCT 24.3* 22.7* 22.2* 23.2* 22.5* 24.0*  MCV 88.4  --   --   --   --   --   PLT 234  --   --   --   --   --     Basic Metabolic Panel: Recent Labs  Lab 10/24/22 0515 10/25/22 0533  NA 138 136  K 3.5 3.2*  CL 97* 98  CO2 30 27  GLUCOSE 111* 100*  BUN 33* 19  CREATININE 1.52* 1.43*  CALCIUM 9.1 8.8*    GFR: Estimated Creatinine Clearance: 54 mL/min (A) (by C-G formula based on SCr of 1.43 mg/dL (H)). Liver Function Tests: Recent Labs  Lab 10/24/22 1237 10/25/22 0533  AST 145* 106*  ALT 53* 48*  ALKPHOS 63 61  BILITOT 2.2* 3.0*  PROT 7.2 6.7  ALBUMIN 3.1* 2.9*    No results for input(s): "LIPASE", "AMYLASE" in the last 168 hours. No results for input(s): "AMMONIA" in the last 168 hours. Coagulation Profile: Recent Labs  Lab 10/24/22 1237  INR 1.3*    Cardiac Enzymes: No results for input(s): "CKTOTAL", "CKMB", "CKMBINDEX", "TROPONINI" in the last 168 hours. BNP (last 3 results) No results for input(s): "PROBNP" in the last 8760 hours. HbA1C: No results for input(s): "HGBA1C" in the last 72 hours. CBG: No results for input(s): "GLUCAP" in the last 168 hours. Lipid  Profile: No results for input(s): "CHOL", "HDL", "LDLCALC", "TRIG", "CHOLHDL", "LDLDIRECT" in the last 72 hours. Thyroid Function Tests: No results for input(s): "TSH", "T4TOTAL", "FREET4", "T3FREE", "THYROIDAB" in the last 72 hours. Anemia Panel: No results for input(s): "VITAMINB12", "FOLATE", "FERRITIN", "TIBC", "IRON", "RETICCTPCT" in the last 72 hours. Sepsis Labs: No results for  input(s): "PROCALCITON", "LATICACIDVEN" in the last 168 hours.  Recent Results (from the past 240 hour(s))  Blood culture (routine x 2)     Status: None (Preliminary result)   Collection Time: 10/24/22 12:24 PM   Specimen: BLOOD  Result Value Ref Range Status   Specimen Description BLOOD LEFT ANTECUBITAL  Final   Special Requests   Final    BOTTLES DRAWN AEROBIC AND ANAEROBIC Blood Culture adequate volume   Culture   Final    NO GROWTH 2 DAYS Performed at [facility], [address] [place], [place] [address]    Report Status PENDING  Incomplete  Blood culture (routine x 2)     Status: None (Preliminary result)   Collection Time: 10/24/22 12:37 PM   Specimen: BLOOD  Result Value Ref Range Status   Specimen Description BLOOD RIGHT ANTECUBITAL  Final   Special Requests   Final    BOTTLES DRAWN AEROBIC AND ANAEROBIC Blood Culture adequate volume   Culture   Final    NO GROWTH 2 DAYS Performed at [facility], [place] [address], [place], [place] [address]    Report Status PENDING  Incomplete     Radiology Studies: US ABDOMEN LIMITED WITH LIVER DOPPLER  Result Date: 10/24/2022 CLINICAL DATA:  Elevated LFTs EXAM: ULTRASOUND ABDOMEN LIMITED RIGHT UPPER QUADRANT DUPLEX ULTRASOUND OF LIVER TECHNIQUE: Limited abdominal ultrasound of the right upper abdomen. Color and duplex Doppler ultrasound was performed to evaluate the hepatic in-flow and out-flow vessels. COMPARISON:  None Available. FINDINGS: Liver: Normal parenchymal echogenicity. Normal hepatic contour without nodularity. No focal lesion,  mass or intrahepatic biliary ductal dilatation. Gallbladder: No gallstones or wall thickening visualized. No sonographic Murphy sign noted by sonographer. Common bile duct: Diameter: 4 mm Main Portal Vein size: 0.7 cm Portal Vein Velocities Main Prox:  42 cm/sec Main Mid: 33 cm/sec Main Dist:  32 cm/sec Right: 28 cm/sec Left: 18 cm/sec Hepatic Vein Velocities Right:  18 cm/sec Middle:  33 cm/sec Left:  26 cm/sec IVC: Present and patent with normal respiratory phasicity. Hepatic Artery Velocity:  94 cm/sec Splenic Vein Velocity:  18 cm/sec Spleen: 9.9 cm x 4.0 cm x 3.4 cm with a total volume of 70 cm^3 (411 cm^3 is upper limit normal) Portal Vein Occlusion/Thrombus: No Splenic Vein Occlusion/Thrombus: No Ascites: None Varices: None IMPRESSION: Negative right upper quadrant ultrasound. Normal Doppler evaluation of the hepatic vasculature. Electronically Signed   By: Julian Hy M.D.   On: 10/24/2022 21:45   DG Abd 1 View  Result Date: 10/24/2022 CLINICAL DATA:  Abdominal distension EXAM: ABDOMEN - 1 VIEW COMPARISON:  CT abdomen pelvis 10/24/2022 FINDINGS: Nonobstructive bowel gas pattern. No bowel dilatation or bowel wall thickening. Lung bases clear. Degenerative and postsurgical changes lumbar spine. No urinary tract calcification. IMPRESSION: No acute abnormalities. Electronically Signed   By: Lavonia Dana M.D.   On: 10/24/2022 16:38    Scheduled Meds:  amLODipine  5 mg Oral Daily   docusate sodium  100 mg Oral BID   fenofibrate  160 mg Oral Daily   finasteride  5 mg Oral Daily   [START ON 10/27/2022] fluconazole  200 mg Oral Daily   fluticasone furoate-vilanterol  1 puff Inhalation Daily   pantoprazole  40 mg Oral BID   polyethylene glycol  17 g Oral BID   rosuvastatin  20 mg Oral Daily   tamsulosin  0.4 mg Oral Daily   Continuous Infusions:  sodium chloride 75 mL/hr at 10/26/22 1153  chlorproMAZINE (THORAZINE) 25 mg in sodium chloride 0.9 % 25 mL IVPB       LOS: 2 days   Darliss Cheney,  MD [facility]  10/26/2022, 3:26 PM   *Please note that this is a verbal dictation therefore any spelling or grammatical errors are due to the "[place] One" system interpretation.  Please page via [place] and do not message via secure chat for urgent patient care matters. Secure chat can be used for non urgent patient care matters.  How to contact the [facility] Attending or Consulting provider [place] or covering provider during after hours [place], for this patient?  Check the care team in [facility] and look for a) attending/consulting [facility] provider listed and b) the [facility] team listed. Page or secure chat [phone]. Log into [email or web address] and use [place]'s universal password to access. If you do not have the password, please contact the hospital operator. Locate the [facility] provider you are looking for under [facility] and page to a number that you can be directly reached. If you still have difficulty reaching the provider, please page the [facility] (Director on Call) for the Hospitalists listed on amion for assistance.

## 2022-10-27 LAB — BASIC METABOLIC PANEL
Anion gap: 8 (ref 5–15)
BUN: 14 mg/dL (ref 8–23)
CO2: 23 mmol/L (ref 22–32)
Calcium: 8.7 mg/dL — ABNORMAL LOW (ref 8.9–10.3)
Chloride: 106 mmol/L (ref 98–111)
Creatinine, Ser: 1.1 mg/dL (ref 0.61–1.24)
GFR, Estimated: >60 mL/min (ref >60)
Glucose, Bld: 88 mg/dL (ref 70–99)
Potassium: 3.5 mmol/L (ref 3.5–5.1)
Sodium: 137 mmol/L (ref 135–145)

## 2022-10-27 LAB — HEMOGLOBIN AND HEMATOCRIT, BLOOD
HCT: 22.1 % — ABNORMAL LOW (ref 39.0–52.0)
Hemoglobin: 7.4 g/dL — ABNORMAL LOW (ref 13.0–17.0)

## 2022-10-27 MED ORDER — PANTOPRAZOLE SODIUM 40 MG PO TBEC: 40.0000 mg | DELAYED_RELEASE_TABLET | Freq: Two times a day (BID) | ORAL | 0 refills | Status: DC

## 2022-10-27 MED ORDER — FLUCONAZOLE 200 MG PO TABS: 200.0000 mg | ORAL_TABLET | Freq: Every day | ORAL | 0 refills | Status: DC

## 2022-10-27 NOTE — Progress Notes (Signed)
Neurosurgery Service Progress Note  Subjective: No acute events overnight, abdomen and back improved, only complaint is still having difficult urinating, has had to be straight cath'd multiple times in the past 24h, +flatus and multiple BMs  Objective: Vitals:   10/26/22 2313 10/27/22 0342 10/27/22 0754 10/27/22 0852  BP: 132/81 129/74 131/78   Pulse: 77 78 75   Resp: '14 16 18 16  '$ Temp: 98.3 F (36.8 C) 99.1 F (37.3 C) 98.5 F (36.9 C)   TempSrc:  Oral    SpO2: 100% 100% 98% 94%  Weight:      Height:        Physical Exam: Abdomen soft, non-tender, less distended, strength 5/5x4, incision c/d/i  Assessment & Plan: 72 y.o. man s/p L1-S1 lami / fusion, post-op readmission for anemia / abdominal pain, fever, transaminitis with hyperbilirubinemia, CT A/P hardware in good position, no concerning findings in the lumbar spine.  -medicine recs, GI recs -discussed options with him for the retention, he'd like to have a foley placed and f/u with his urologist -discharge home today  Judith Part  10/27/22 9:03 AM

## 2022-10-27 NOTE — TOC Transition Note (Addendum)
Transition of Care [facility]) - CM/SW Discharge Note   Patient Details  Name: Todd Mendoza MRN: [id number] Date of Birth: 1950-10-18  Transition of Care [facility]) CM/SW Contact:  Pollie Friar, RN Phone Number: 10/27/2022, 10:27 AM   Clinical Narrative:    The patient is discharging home with self care.  Urology appointment obtained and placed on AVS.   Final next level of care: Home/Self Care Barriers to Discharge: No Barriers Identified   Patient Goals and CMS Choice      Discharge Placement                         Discharge Plan and Services Additional resources added to the After Visit Summary for     Discharge Planning Services: CM Consult                                 Social Determinants of Health (SDOH) Interventions SDOH Screenings   Food Insecurity: No Food Insecurity (10/24/2022)  Housing: Low Risk  (10/24/2022)  Transportation Needs: No Transportation Needs (10/24/2022)  Utilities: Not At Risk (10/24/2022)  Alcohol Screen: Low Risk  (06/19/2021)  Depression (PHQ2-9): Low Risk  (09/15/2021)  Financial Resource Strain: Low Risk  (06/19/2021)  Physical Activity: Sufficiently Active (06/19/2021)  Social Connections: Moderately Integrated (06/19/2021)  Stress: No Stress Concern Present (03/25/2020)  Tobacco Use: Medium Risk (10/26/2022)     Readmission Risk Interventions     No data to display

## 2022-10-27 NOTE — Care Management Important Message (Signed)
Important Message  Patient Details  Name: Byan Poplaski MRN: [id number] Date of Birth: 01-24-1951   Medicare Important Message Given:  Yes  Patient left prior to IM delivery will mail IM to the patient home address.   Shauniece Kwan 10/27/2022, 1:30 PM

## 2022-10-27 NOTE — Progress Notes (Signed)
PROGRESS NOTE    Todd Mendoza  WIO:[id number] DOB: [birth year] DOA: 10/24/2022 PCP: Sandrea Hughs, NP   Brief Narrative:  HPI: Todd Mendoza is a 72 y.o. male with past medical history of hypertension, hyperlipidemia, hepatitis C s/p treatment, s/p open lumbar decompression and PSIF who presented with complaints of abdominal distention, back pain, and bleeding from his surgical wound.  He had underwent a uncomplicated E2-A8 decompression and PSIF with Dr. Venetia Constable on 1/25.  Following the surgery patient reported that he had been constipated needing to take a stool softener 3 days ago and today to have a bowel movement.  His wife reports that his stool is dark green and had blood present in it when she looked at it from this morning.  The patient's wife makes note that he had significant bleeding from his back wound this morning when they woke up for which the they were not able to get it to stop at home.  He denies any trauma or fall to possibly onset symptoms.  Associated symptoms include subjective fever/chills, difficulty urinating, abdominal distention lower extremity swelling,  hiccups, and poor appetite.  Denies having any nausea, vomiting, or saddle anesthesia.  Patient reports that he had been taking muscle relaxers along with oxycodone for his back pain.  He is not on any blood thinners, but does take a daily 81 mg of aspirin.  Patient denies any use of any NSAIDs and does not drink alcohol.      Patient was noted to be febrile up to 100.9 F vital signs relatively maintained.  Labs noted WBC 8.9, hemoglobin 7.8, BUN 33, creatinine 1.52, AST 145, and total bilirubin 2.2.  Chest x-ray had not noted any acute abnormality.  Blood cultures had been obtained due to the fever.  Dr. Venetia Constable of neurosurgery have been consulted and accepted admit the patient.  Hospitalist were consulted for medical comanagement.  Assessment & Plan:   Principal Problem:   Ileus ([place]) Active Problems:   Acute  blood loss anemia   Stool guaiac positive   Abdominal distention   Fever   HTN (hypertension)   AKI (acute kidney injury) (HCC)   COPD (chronic obstructive pulmonary disease) (HCC)   Elevated liver enzymes   BPH (benign prostatic hyperplasia)   Drug-induced constipation   Hiccups   Acute esophagitis   Esophageal candidiasis (HCC)  S/p open lumbar decompression and PSIF   Patient had surgery on 1/25.  Wound was noted to be bleeding acutely this morning and patient and wife, but since being here in the hospital has not had any recurrence of bleeding.  Neurosurgery evaluated the wound and stated that it was and did not appear to show signs of infection. -Per neurosurgery   Acute blood loss anemia Positive stool guaiac/GERD/esophageal candidiasis Patient presents with hemoglobin down to 7.8 with normal MCV and MCH. Hemoglobin previously had been 12.4 prior to the surgery on 1/11.  He was noted to have lost a net 300 to 400 cc of blood intraoperatively.  Unclear of patient's blood loss is all related to surgery and this episode of bleeding from his wound.  Patient was typed and screened for possible need of blood products.  Last colonoscopy was from 07/2021 which noted internal and external hemorrhoids, polyps in the rectum hepatic flexure, and ascending colon removed by cold snare.  Also noted to have diverticulosis in the rectosigmoid colon, sigmoid colon, and descending colon. -Hold aspirin  stool guaiac (positive).  GI consulted, patient underwent EGD 10/26/2022 was  found to have GERD and esophageal candidiasis, started on fluconazole and they recommended that for 14 days, also started on PPI 40 mg p.o. twice daily which was recommended for 8 weeks, both medications prescribed.  Hemoglobin stable around 7.4.   Fever Patient noted to have fever at elevated up to  100.9 F on admission.  Blood and urine cultures have been ordered, but he did not meet any other SIRS criteria and white blood cell  count within normal limits.  Urinalysis noted small hemoglobin, negative nitrites, negative leukocytes, no bacteria seen, 6-10 RBCs/hpf, and 11-20 WBCs.  Low-grade fever could be related with atelectasis.  He is afebrile since 1 PM 10/24/2022.   Abdominal distension-constipation Patient reports having progressive worsening abdominal distention with reports of constipation. CT scan of the abdomen pelvis(noted no clear signs of any retroperitoneal bleeding, but did note distention of the bladder and renal collecting system without obstructing lesion.).  Was given Dulcolax suppository yesterday and has had 3 bowel movements and his abdomen is soft and not as distended as it was yesterday.   acute kidney injury Patient presents with creatinine elevated up to 1.52 with BUN 33.  The elevated BUN to creatinine ratio is greater than 20 to suggest the possibility of prerenal cause of symptoms.  Baseline creatinine has been around 0.8-1.  He was given IV fluids and his creatinine is back at baseline.   Essential hypertension Blood pressures were noted to be 122/77 -143/75. -Continue amlodipine as tolerated -Held hydrochlorothiazide due to AKI but now resolving AKI so resuming all medications.   COPD, without acute exacerbation On physical exam patient without significant wheezing or rhonchi appreciated. -Continue Breo -Albuterol nebs as needed for shortness of breath/wheezing   Elevated liver enzymes History of hepatitis C Acute.  Labs noted AST 145 and ALT 53.  History of hepatitis C reported to have been treated a couple years ago in [facility].  The elevation in patient's liver enzymes could be related with acute drop in blood counts.  Patient denies any reports of drinking alcohol.  Acute viral hepatitis panel negative.  Ultrasound abdomen negative.   BPH, with urinary retention Required in and out couple times, now going home with Foley catheter.  Follow-up with urology.  Patient understands  that. -Continue Flomax and Proscar   DVT prophylaxis: SCD's Start: 10/24/22 1557   Code Status: Full Code  Family Communication: None present at bedside.  Plan of care discussed with patient in length and he/she verbalized understanding and agreed with it.  Status is: Inpatient Remains inpatient appropriate because: He is being discharged by neurosurgery/primary service.  Estimated body mass index is 30.05 kg/m as calculated from the following:   Height as of this encounter: '5\' 10"'$  (1.778 m).   Weight as of this encounter: 95 kg.    Nutritional Assessment: Body mass index is 30.05 kg/m.Marland Kitchen Seen by dietician.  I agree with the assessment and plan as outlined below: Nutrition Status:        . Skin Assessment: I have examined the patient's skin and I agree with the wound assessment as performed by the wound care RN as outlined below:    Consultants:  GI and TRH  Procedures:  As above  Antimicrobials:  Anti-infectives (From admission, onward)    Start     Dose/Rate Route Frequency Ordered Stop   10/27/22 1000  fluconazole (DIFLUCAN) tablet 200 mg        200 mg Oral Daily 10/26/22 1054 11/09/22 0959  10/27/22 0000  fluconazole (DIFLUCAN) 200 MG tablet        200 mg Oral Daily 10/27/22 0909     10/26/22 1100  fluconazole (DIFLUCAN) tablet 400 mg        400 mg Oral  Once 10/26/22 1054 10/26/22 1314         Subjective:  Patient seen and examined.  He is feeling much better.  He has no complaints.  Objective: Vitals:   10/26/22 2313 10/27/22 0342 10/27/22 0754 10/27/22 0852  BP: 132/81 129/74 131/78   Pulse: 77 78 75   Resp: '14 16 18 16  '$ Temp: 98.3 F (36.8 C) 99.1 F (37.3 C) 98.5 F (36.9 C)   TempSrc:  Oral    SpO2: 100% 100% 98% 94%  Weight:      Height:        Intake/Output Summary (Last 24 hours) at 10/27/2022 0955 Last data filed at 10/27/2022 6283 Gross per 24 hour  Intake 520 ml  Output 2602 ml  Net -2082 ml    Filed Weights   10/24/22 2241  10/25/22 0600  Weight: 95 kg 95 kg    Examination:  General exam: Appears calm and comfortable  Respiratory system: Clear to auscultation. Respiratory effort normal. Cardiovascular system: S1 & S2 heard, RRR. No JVD, murmurs, rubs, gallops or clicks. No pedal edema. Gastrointestinal system: Abdomen is nondistended, soft and nontender. No organomegaly or masses felt. Normal bowel sounds heard. Central nervous system: Alert and oriented. No focal neurological deficits. Extremities: Symmetric 5 x 5 power. Skin: No rashes, lesions or ulcers.  Psychiatry: Judgement and insight appear normal. Mood & affect appropriate.    Data Reviewed: I have personally reviewed following labs and imaging studies  CBC: Recent Labs  Lab 10/24/22 0515 10/24/22 1800 10/25/22 0533 10/25/22 1727 10/26/22 0722 10/26/22 1652 10/27/22 0631  WBC 8.9  --   --   --   --   --   --   HGB 7.8*   < > 7.7* 7.4* 8.1* 7.7* 7.4*  HCT 24.3*   < > 23.2* 22.5* 24.0* 22.5* 22.1*  MCV 88.4  --   --   --   --   --   --   PLT 234  --   --   --   --   --   --    < > = values in this interval not displayed.    Basic Metabolic Panel: Recent Labs  Lab 10/24/22 0515 10/25/22 0533 10/27/22 0631  NA 138 136 137  K 3.5 3.2* 3.5  CL 97* 98 106  CO2 '30 27 23  '$ GLUCOSE 111* 100* 88  BUN 33* 19 14  CREATININE 1.52* 1.43* 1.10  CALCIUM 9.1 8.8* 8.7*    GFR: Estimated Creatinine Clearance: 70.2 mL/min (by C-G formula based on SCr of 1.1 mg/dL). Liver Function Tests: Recent Labs  Lab 10/24/22 1237 10/25/22 0533  AST 145* 106*  ALT 53* 48*  ALKPHOS 63 61  BILITOT 2.2* 3.0*  PROT 7.2 6.7  ALBUMIN 3.1* 2.9*    No results for input(s): "LIPASE", "AMYLASE" in the last 168 hours. No results for input(s): "AMMONIA" in the last 168 hours. Coagulation Profile: Recent Labs  Lab 10/24/22 1237  INR 1.3*    Cardiac Enzymes: No results for input(s): "CKTOTAL", "CKMB", "CKMBINDEX", "TROPONINI" in the last 168  hours. BNP (last 3 results) No results for input(s): "PROBNP" in the last 8760 hours. HbA1C: No results for input(s): "HGBA1C" in the last  72 hours. CBG: No results for input(s): "GLUCAP" in the last 168 hours. Lipid Profile: No results for input(s): "CHOL", "HDL", "LDLCALC", "TRIG", "CHOLHDL", "LDLDIRECT" in the last 72 hours. Thyroid Function Tests: No results for input(s): "TSH", "T4TOTAL", "FREET4", "T3FREE", "THYROIDAB" in the last 72 hours. Anemia Panel: No results for input(s): "VITAMINB12", "FOLATE", "FERRITIN", "TIBC", "IRON", "RETICCTPCT" in the last 72 hours. Sepsis Labs: No results for input(s): "PROCALCITON", "LATICACIDVEN" in the last 168 hours.  Recent Results (from the past 240 hour(s))  Blood culture (routine x 2)     Status: None (Preliminary result)   Collection Time: 10/24/22 12:24 PM   Specimen: BLOOD  Result Value Ref Range Status   Specimen Description BLOOD LEFT ANTECUBITAL  Final   Special Requests   Final    BOTTLES DRAWN AEROBIC AND ANAEROBIC Blood Culture adequate volume   Culture   Final    NO GROWTH 2 DAYS Performed at [facility], [address] [place], [place] [address]    Report Status PENDING  Incomplete  Blood culture (routine x 2)     Status: None (Preliminary result)   Collection Time: 10/24/22 12:37 PM   Specimen: BLOOD  Result Value Ref Range Status   Specimen Description BLOOD RIGHT ANTECUBITAL  Final   Special Requests   Final    BOTTLES DRAWN AEROBIC AND ANAEROBIC Blood Culture adequate volume   Culture   Final    NO GROWTH 2 DAYS Performed at [facility], [place] [address], [place], [place] [address]    Report Status PENDING  Incomplete     Radiology Studies: No results found.  Scheduled Meds:  amLODipine  5 mg Oral Daily   docusate sodium  100 mg Oral BID   fenofibrate  160 mg Oral Daily   finasteride  5 mg Oral Daily   fluconazole  200 mg Oral Daily   fluticasone furoate-vilanterol  1 puff Inhalation Daily    pantoprazole  40 mg Oral BID   polyethylene glycol  17 g Oral BID   rosuvastatin  20 mg Oral Daily   tamsulosin  0.4 mg Oral Daily   Continuous Infusions:  sodium chloride 75 mL/hr at 10/27/22 0120   chlorproMAZINE (THORAZINE) 25 mg in sodium chloride 0.9 % 25 mL IVPB       LOS: 3 days   Darliss Cheney, MD [facility]  10/27/2022, 9:55 AM   *Please note that this is a verbal dictation therefore any spelling or grammatical errors are due to the "[employer] [employer]" system interpretation.  Please page via [place] and do not message via secure chat for urgent patient care matters. Secure chat can be used for non urgent patient care matters.  How to contact the [facility] Attending or Consulting provider [place] or covering provider during after hours [place], for this patient?  Check the care team in [facility] and look for a) attending/consulting TRH provider listed and b) the [facility] team listed. Page or secure chat [phone]. Log into [email or web address] and use 's universal password to access. If you do not have the password, please contact the hospital operator. Locate the [facility] provider you are looking for under [facility] and page to a number that you can be directly reached. If you still have difficulty reaching the provider, please page the [facility] (Director on Call) for the Hospitalists listed on amion for assistance.

## 2022-10-27 NOTE — Discharge Summary (Addendum)
Discharge Summary  Date of Admission: 10/24/2022  Date of Discharge: 10/27/22  Attending Physician: Emelda Brothers, MD  Hospital Course: Patient was readmitted post-op with worsening back pain, new hiccoughs, abdominal distention, and some bloody stool with a mild fever. He was admitted, medicine was consulted, he had some ABLA thought to be GI with guaiac+, GI was consulted and performed an upper endoscopy with esophageal candidiasis. His distention improved w/ resolution of hiccoughs and return of normal bowel function and improvement in symptoms. He had a mild AKI that improved with IV hydration, mildly elevated LFTs, urinary retention that required replacement of foley catheter with outpatient follow up with urology.  Neurologic exam at discharge:  Strength 5/5 x4 and SILTx4   Discharge diagnosis: Gastrointestinal hemorrhage, urinary retention  Judith Part, MD 10/27/22 9:09 AM

## 2022-10-29 LAB — CULTURE, BLOOD (ROUTINE X 2)
Culture: NO GROWTH
Culture: NO GROWTH
Special Requests: ADEQUATE
Special Requests: ADEQUATE

## 2022-10-30 ENCOUNTER — Telehealth: Payer: Self-pay

## 2022-10-30 NOTE — Patient Outreach (Signed)
  Care Coordination TOC Note Transition Care Management Follow-up Telephone Call Date of discharge and from where: 10/27/22-[facility]  How have you been since you were released from the hospital? Call completed with spouse-spoke briefly as she was running errands. She reports patient is "doing good." He has been complaining of some abd pain-patient to take new med that should help with sxs which she is on the way to get from pharmacy. She voices his appetite is normal/WNL for him. Denies any n&v. Spouse voices patient had normal BM this morning.  Any questions or concerns? No  Items Reviewed: Did the pt receive and understand the discharge instructions provided? Yes  Medications obtained and verified?  Spouse currently not at home-headed to pharmacy to get meds Other? Yes  Any new allergies since your discharge? No  Dietary orders reviewed? Yes Do you have support at home? Yes   Home Care and Equipment/Supplies: Were home health services ordered? not applicable If so, what is the name of the agency? N/A  Has the agency set up a time to come to the patient's home? not applicable Were any new equipment or medical supplies ordered?  No What is the name of the medical supply agency? N/A Were you able to get the supplies/equipment? not applicable Do you have any questions related to the use of the equipment or supplies? No  Functional Questionnaire: (I = Independent and D = Dependent) ADLs: A  Bathing/Dressing- A  Meal Prep- A  Eating- I  Maintaining continence- I  Transferring/Ambulation- I  Managing Meds- A  Follow up appointments reviewed:  PCP Hospital f/u appt confirmed? Yes  Scheduled to see Dinah Ngetich,NP on 11/16/22 @  [facility] f/u appt confirmed? Yes  Scheduled to see Domingo Pulse on 11/09/22- 8 am, Dr, Belenda Cruise on 11/15/22 @ 11: 15 am  Are transportation arrangements needed? No  If their condition worsens, is the pt aware to call PCP or go to  the Emergency Dept.? Yes Was the patient provided with contact information for the PCP's office or ED? Yes Was to pt encouraged to call back with questions or concerns? Yes  SDOH assessments and interventions completed:   Yes SDOH Interventions Today    Flowsheet Row Most Recent Value  SDOH Interventions   Food Insecurity Interventions Intervention Not Indicated  Transportation Interventions Intervention Not Indicated, Other (Comment)  [spouse states she does not drive-they usually get family/friends to take them to appts-advised to call [employer] to see if pt has transportation benefit-she will call and f/u]       Care Coordination Interventions:  Referred for Care Coordination Services:  RN Care Coordinator Education provided    Encounter Outcome:  Pt. Visit Completed    Enzo Montgomery, RN,BSN,CCM [place] Management Telephonic Care Management Coordinator Direct Phone: [phone] Toll Free: [phone] Fax: [phone]

## 2022-10-31 ENCOUNTER — Encounter: Payer: Self-pay | Admitting: Internal Medicine

## 2022-10-31 LAB — SURGICAL PATHOLOGY

## 2022-11-03 MED FILL — Heparin Sodium (Porcine) Inj 1000 Unit/ML: INTRAMUSCULAR | Qty: 30 | Status: AC

## 2022-11-03 MED FILL — Sodium Chloride Irrigation Soln 0.9%: Qty: 3000 | Status: AC

## 2022-11-03 MED FILL — Sodium Chloride IV Soln 0.9%: INTRAVENOUS | Qty: 1000 | Status: AC

## 2022-11-06 ENCOUNTER — Encounter: Payer: Self-pay | Admitting: Pharmacist

## 2022-11-09 DIAGNOSIS — R338 Other retention of urine: Secondary | ICD-10-CM | POA: Diagnosis not present

## 2022-11-13 ENCOUNTER — Encounter: Payer: Self-pay | Admitting: Pharmacist

## 2022-11-13 ENCOUNTER — Ambulatory Visit: Payer: Medicare Other

## 2022-11-13 ENCOUNTER — Ambulatory Visit: Payer: Medicare Other | Admitting: Family

## 2022-11-13 DIAGNOSIS — I10 Essential (primary) hypertension: Secondary | ICD-10-CM | POA: Diagnosis not present

## 2022-11-13 DIAGNOSIS — I700 Atherosclerosis of aorta: Secondary | ICD-10-CM | POA: Diagnosis not present

## 2022-11-13 DIAGNOSIS — E782 Mixed hyperlipidemia: Secondary | ICD-10-CM | POA: Diagnosis not present

## 2022-11-13 DIAGNOSIS — E781 Pure hyperglyceridemia: Secondary | ICD-10-CM

## 2022-11-14 LAB — CBC WITH DIFFERENTIAL/PLATELET
Absolute Monocytes: 775 cells/uL (ref 200–950)
Basophils Absolute: 41 cells/uL (ref 0–200)
Basophils Relative: 0.6 %
Eosinophils Absolute: 231 cells/uL (ref 15–500)
Eosinophils Relative: 3.4 %
HCT: 28.7 % — ABNORMAL LOW (ref 38.5–50.0)
Hemoglobin: 9.1 g/dL — ABNORMAL LOW (ref 13.2–17.1)
Lymphs Abs: 2645 cells/uL (ref 850–3900)
MCH: 27.7 pg (ref 27.0–33.0)
MCHC: 31.7 g/dL — ABNORMAL LOW (ref 32.0–36.0)
MCV: 87.5 fL (ref 80.0–100.0)
MPV: 10.6 fL (ref 7.5–12.5)
Monocytes Relative: 11.4 %
Neutro Abs: 3108 cells/uL (ref 1500–7800)
Neutrophils Relative %: 45.7 %
Platelets: 392 10*3/uL (ref 140–400)
RBC: 3.28 10*6/uL — ABNORMAL LOW (ref 4.20–5.80)
RDW: 12.9 % (ref 11.0–15.0)
Total Lymphocyte: 38.9 %
WBC: 6.8 10*3/uL (ref 3.8–10.8)

## 2022-11-14 LAB — COMPLETE METABOLIC PANEL WITH GFR
AG Ratio: 1 (calc) (ref 1.0–2.5)
ALT: 15 U/L (ref 9–46)
AST: 23 U/L (ref 10–35)
Albumin: 3.9 g/dL (ref 3.6–5.1)
Alkaline phosphatase (APISO): 66 U/L (ref 35–144)
BUN: 17 mg/dL (ref 7–25)
CO2: 25 mmol/L (ref 20–32)
Calcium: 9.5 mg/dL (ref 8.6–10.3)
Chloride: 107 mmol/L (ref 98–110)
Creat: 1.1 mg/dL (ref 0.70–1.28)
Globulin: 4 g/dL (calc) — ABNORMAL HIGH (ref 1.9–3.7)
Glucose, Bld: 88 mg/dL (ref 65–99)
Potassium: 4.5 mmol/L (ref 3.5–5.3)
Sodium: 142 mmol/L (ref 135–146)
Total Bilirubin: 0.4 mg/dL (ref 0.2–1.2)
Total Protein: 7.9 g/dL (ref 6.1–8.1)
eGFR: 71 mL/min/{1.73_m2} (ref >=60)

## 2022-11-14 LAB — LIPID PANEL
Cholesterol: 84 mg/dL (ref <200)
HDL: 35 mg/dL — ABNORMAL LOW (ref >=40)
LDL Cholesterol (Calc): 33 mg/dL (calc)
Non-HDL Cholesterol (Calc): 49 mg/dL (calc) (ref <130)
Total CHOL/HDL Ratio: 2.4 (calc) (ref <5.0)
Triglycerides: 75 mg/dL (ref <150)

## 2022-11-14 LAB — TSH: TSH: 1.97 mIU/L (ref 0.40–4.50)

## 2022-11-15 ENCOUNTER — Encounter: Payer: Self-pay | Admitting: Nurse Practitioner

## 2022-11-15 ENCOUNTER — Ambulatory Visit: Payer: Medicare Other | Admitting: Nurse Practitioner

## 2022-11-15 VITALS — BP 118/76 | HR 96 | Ht 70.0 in | Wt 208.7 lb

## 2022-11-15 DIAGNOSIS — R918 Other nonspecific abnormal finding of lung field: Secondary | ICD-10-CM | POA: Diagnosis not present

## 2022-11-15 DIAGNOSIS — D649 Anemia, unspecified: Secondary | ICD-10-CM | POA: Diagnosis not present

## 2022-11-15 DIAGNOSIS — J4530 Mild persistent asthma, uncomplicated: Secondary | ICD-10-CM | POA: Diagnosis not present

## 2022-11-15 MED ORDER — BREZTRI AEROSPHERE 160-9-4.8 MCG/ACT IN AERO: 2.0000 | INHALATION_SPRAY | Freq: Two times a day (BID) | RESPIRATORY_TRACT | 6 refills | Status: DC

## 2022-11-15 NOTE — Assessment & Plan Note (Signed)
Improving. Follow up with GI and PCP as scheduled.

## 2022-11-15 NOTE — Assessment & Plan Note (Signed)
Previous pulmonary function testing revealed moderate obstruction with reversibility, consistent with asthma.  He also has peripheral eosinophils on previous testing.  No formal diagnosis of COPD based on ratio of 85.  He does have a reduction in diffusion capacity, consistent with emphysema.  We reviewed disease process in depth at previous OV.  We started him on ICS/LABA therapy with Breo and rx for rescue albuterol HFA. He seems to receive better benefit from the albuterol; question if this is due to delivery method? We did review proper inhaler technique again today. I am going to provide him with samples of Breztri to add LAMA therapy and give him HFA style inhaler. Will continue to monitor moving forward. Action plan in place.  Patient Instructions  Stop Breo. Trial Breztri 2 puffs Twice daily. Brush tongue and rinse mouth afterwards. Call me and let me know if this helps and I will send a new prescription  Continue Albuterol inhaler 2 puffs every 6 hours as needed for shortness of breath or wheezing. Notify if symptoms persist despite rescue inhaler/neb use.   Follow up with your primary care provider as scheduled for the anemia  Follow up with Dr. Lamonte Sakai or Joellen Jersey Kayden Amend,NP in 3 months. If symptoms do not improve or worsen, please contact office for sooner follow up or seek emergency care.

## 2022-11-15 NOTE — Patient Instructions (Signed)
Stop Breo. Trial Breztri 2 puffs Twice daily. Brush tongue and rinse mouth afterwards. Call me and let me know if this helps and I will send a new prescription  Continue Albuterol inhaler 2 puffs every 6 hours as needed for shortness of breath or wheezing. Notify if symptoms persist despite rescue inhaler/neb use.   Follow up with your primary care provider as scheduled for the anemia  Follow up with Dr. Lamonte Sakai or Joellen Jersey Sriyan Cutting,NP in 3 months. If symptoms do not improve or worsen, please contact office for sooner follow up or seek emergency care.

## 2022-11-15 NOTE — Assessment & Plan Note (Signed)
Lung RADS 2. Repeat CT chest November 2024.

## 2022-11-15 NOTE — Progress Notes (Signed)
$@PatientM$  ID: [name], male    DOB: 1951/08/28, 72 y.o.   MRN: [id number]  Chief Complaint  Patient presents with   Follow-up    Pt f/u he is having other problems other than respiratory. He does say that the albuterol inhaler helps w/ SOB but can't tell a difference w/ the Breo.    Referring provider: Ngetich, Nelda Bucks, NP  HPI: 72 year old male, former smoker followed for DOE, emphysema, and pulmonary nodules. He is a patient of Dr. Agustina Caroli and last seen in office 10/04/2022 by [facility] NP. Past medical history significant for HTN, allergic rhinitis, HLD.  TEST/EVENTS:  08/09/2022 LDCT chest: atherosclerosis. Mild centrilobular emphysema. Calcified and noncalcified nodules, largest measuring 3.1 mm. Lung RADS 2 10/03/2022 PFT: FVC 50, FEV1 53, ratio 85, TLC 86, DLCOcor 70. Positive bronchodilator response (21% change)  08/23/2022: OV with Dr. Lamonte Sakai for initial consult. Referred by his PC for suspected COPD. He also had LDCT for lung cancer screening with pulmonary nodular disease. Reports exertional SOB with walking, extended activity. No real cough. Does sometimes hear rattling in his chest. Able to do chores, work outside. Not on any BD. Emphysematous changes on imaging. PFTs ordered for further evaluation. Lung RADS 2 on imaging; plan for repeat in 1 year, November 2024.  10/04/2022: OV with Jennel Mara NP for follow-up after undergoing pulmonary function testing.  He had FVC of 50, FEV1 of 53, ratio of 85, TLC of 86 and DLCO 70.  He did have significant bronchodilator response with 21% improvement.  Today, he tells me that he is feeling relatively unchanged compared to when he was here last.  He has never been on any inhaler therapies.  He gets short winded with strenuous activity and long distance walking.  Really notices it with uphill/stair climbing.  He has an occasional cough, which is usually dry but sometimes will produce a small amount of clear mucus in the mornings.  Does notice an  occasional wheeze.  No history of childhood asthma.  Occasionally has some allergy type symptoms in the fall and spring.  11/15/2022: Today - follow up Patient presents today with his wife for follow up after being started on Breo for suspected asthma/emphysema.  Since he was here last, he has had some trouble with anemia following a neurosurgery procedure.  He presented to the ED with worsening back pain, new hiccoughs, abd distention, and some bloody stool with mild fever. He had positive guaiac. He was hospitalized from 10/24/2022 to 10/27/2022 for this and treated with blood transfusion. He underwent endoscopy with GI; esophageal candidiasis. He improved and was discharged on fluconazole and PPI. He had follow up with his PCP on Monday 2/19 and hgb had improved to 9.1 from 7.4. He feels like he is recovering well. Denies any lightheadedness or dizziness.  Regarding his breathing, he started using the Breo daily and albuterol inhaler PRN.  He has not noticed that the [facility] helps with his shortness of breath much; however, he feels like the albuterol does a great job and helps open up his chest.  He has been using this twice daily to help with his baseline dyspnea.  Feels that it gives him a little more stamina.  He has not noticed any significant cough since he was here last.  Has not noticed any wheezing either.  No Known Allergies  Immunization History  Administered Date(s) Administered   Fluad Quad(high Dose 65+) 06/24/2019, 06/21/2020   Influenza, High Dose Seasonal PF 09/17/2018  Influenza-Unspecified 06/01/2021, 08/10/2022   PFIZER(Purple Top)SARS-COV-2 Vaccination 11/24/2019, 12/23/2019   Pneumococcal Conjugate-13 04/11/2016   Pneumococcal Polysaccharide-23 05/15/2017   Tdap 04/02/2017   Zoster Recombinat (Shingrix) 10/18/2020, 12/20/2020    Past Medical History:  Diagnosis Date   Arthritis    Asthma    Hepatitis    treated for Hep C in the past   History of substance abuse ([place])     cocaine- over 20 years ago   Hyperlipidemia    Hypertension    Stroke ([place])    Vertigo     Tobacco History: Social History   Tobacco Use  Smoking Status Former   Packs/day: 0.20   Types: Cigarettes  Smokeless Tobacco Never   Counseling given: Not Answered   Outpatient Medications Prior to Visit  Medication Sig Dispense Refill   albuterol (VENTOLIN HFA) 108 (90 Base) MCG/ACT inhaler Inhale 2 puffs into the lungs every 6 (six) hours as needed for wheezing or shortness of breath. 8 g 2   amLODipine (NORVASC) 5 MG tablet TAKE 1 TABLET (5 MG TOTAL) BY MOUTH DAILY. 90 tablet 3   aspirin EC 81 MG tablet Take 1 tablet (81 mg total) by mouth daily. 30 tablet 0   atorvastatin (LIPITOR) 40 MG tablet Take 40 mg by mouth daily.     fenofibrate (TRICOR) 145 MG tablet Take 1 tablet (145 mg total) by mouth daily. 90 tablet 1   finasteride (PROSCAR) 5 MG tablet TAKE 1 TABLET (5 MG TOTAL) BY MOUTH DAILY. 90 tablet 1   fluconazole (DIFLUCAN) 200 MG tablet Take 1 tablet (200 mg total) by mouth daily. 14 tablet 0   gabapentin (NEURONTIN) 300 MG capsule Take 300 mg by mouth 3 (three) times daily.     hydrochlorothiazide (HYDRODIURIL) 25 MG tablet TAKE 1 TABLET (25 MG TOTAL) BY MOUTH DAILY. 90 tablet 3   methocarbamol (ROBAXIN) 500 MG tablet Take 1 tablet (500 mg total) by mouth 4 (four) times daily. 40 tablet 1   Multiple Vitamins-Minerals (MULTIVITAMIN WITH MINERALS) tablet Take 1 tablet by mouth daily. 30 tablet 5   pantoprazole (PROTONIX) 40 MG tablet Take 1 tablet (40 mg total) by mouth 2 (two) times daily. 112 tablet 0   silodosin (RAPAFLO) 8 MG CAPS capsule Take 8 mg by mouth daily.     fluticasone furoate-vilanterol (BREO ELLIPTA) 100-25 MCG/ACT AEPB Inhale 1 puff into the lungs daily. 60 each 5   oxyCODONE (OXY IR/ROXICODONE) 5 MG immediate release tablet Take 1 tablet (5 mg total) by mouth every 4 (four) hours as needed (pain). 30 tablet 0   rosuvastatin (CRESTOR) 20 MG tablet Take 1 tablet  (20 mg total) by mouth daily. Begin 09/15/22 90 tablet 1   No facility-administered medications prior to visit.     Review of Systems:   Constitutional: No weight loss or gain, night sweats, fevers, chills, fatigue, or lassitude. HEENT: No headaches, difficulty swallowing, tooth/dental problems, or sore throat. No sneezing, itching, ear ache, nasal congestion, or post nasal drip CV:  No chest pain, orthopnea, PND, swelling in lower extremities, anasarca, dizziness, palpitations, syncope Resp:+shortness of breath with exertion.  No cough.  No wheeze.  No excess mucus or change in color of mucus. No hemoptysis.  No chest wall deformity GI:  No heartburn, indigestion, abdominal pain, nausea, vomiting, diarrhea, change in bowel habits, loss of appetite, bloody stools.  GU: No dysuria, change in color of urine, urgency or frequency.  No flank pain, no hematuria  Skin: No rash, lesions, ulcerations  MSK:  No joint pain or swelling.  Neuro: No dizziness or lightheadedness.  Psych: No depression or anxiety. Mood stable.     Physical Exam:  BP 118/76   Pulse 96   Ht 5' 10"$  (1.778 m)   Wt 208 lb 11.2 oz (94.7 kg)   SpO2 95%   BMI 29.95 kg/m   GEN: Pleasant, interactive, well-appearing; obese; in no acute distress HEENT:  Normocephalic and atraumatic. PERRLA. Sclera white. Nasal turbinates pink, moist and patent bilaterally. No rhinorrhea present. Oropharynx pink and moist, without exudate or edema. No lesions, ulcerations, or postnasal drip.  NECK:  Supple w/ fair ROM. No JVD present. Normal carotid impulses w/o bruits. Thyroid symmetrical with no goiter or nodules palpated. No lymphadenopathy.   CV: RRR, no m/r/g, no peripheral edema. Pulses intact, +2 bilaterally. No cyanosis, pallor or clubbing. PULMONARY:  Unlabored, regular breathing. Clear bilaterally A&P w/o wheezes/rales/rhonchi. No accessory muscle use.  GI: BS present and normoactive. Soft, non-tender to palpation. No organomegaly  or masses detected.  MSK: No erythema, warmth or tenderness. Cap refil <2 sec all extrem. No deformities or joint swelling noted.  Neuro: A/Ox3. No focal deficits noted.   Skin: Warm, no lesions or rashe Psych: Normal affect and behavior. Judgement and thought content appropriate.     Lab Results:  CBC    Component Value Date/Time   WBC 6.8 11/13/2022 0817   RBC 3.28 (L) 11/13/2022 0817   HGB 9.1 (L) 11/13/2022 0817   HGB 11.9 (L) 04/03/2022 1633   HCT 28.7 (L) 11/13/2022 0817   HCT 37.1 (L) 04/03/2022 1633   PLT 392 11/13/2022 0817   PLT 205 04/03/2022 1633   MCV 87.5 11/13/2022 0817   MCV 88 04/03/2022 1633   MCH 27.7 11/13/2022 0817   MCHC 31.7 (L) 11/13/2022 0817   RDW 12.9 11/13/2022 0817   RDW 12.5 04/03/2022 1633   LYMPHSABS 2,645 11/13/2022 0817   LYMPHSABS 2.6 05/18/2021 0957   MONOABS 0.6 02/13/2019 0816   EOSABS 231 11/13/2022 0817   EOSABS 0.4 05/18/2021 0957   BASOSABS 41 11/13/2022 0817   BASOSABS 0.0 05/18/2021 0957    BMET    Component Value Date/Time   NA 142 11/13/2022 0817   NA 145 (H) 04/03/2022 1633   K 4.5 11/13/2022 0817   CL 107 11/13/2022 0817   CO2 25 11/13/2022 0817   GLUCOSE 88 11/13/2022 0817   BUN 17 11/13/2022 0817   BUN 10 04/03/2022 1633   CREATININE 1.10 11/13/2022 0817   CALCIUM 9.5 11/13/2022 0817   GFRNONAA >60 10/27/2022 0631   GFRAA 88 11/08/2020 0925    BNP No results found for: "BNP"   Imaging:  US ABDOMEN LIMITED WITH LIVER DOPPLER  Result Date: 10/24/2022 CLINICAL DATA:  Elevated LFTs EXAM: ULTRASOUND ABDOMEN LIMITED RIGHT UPPER QUADRANT DUPLEX ULTRASOUND OF LIVER TECHNIQUE: Limited abdominal ultrasound of the right upper abdomen. Color and duplex Doppler ultrasound was performed to evaluate the hepatic in-flow and out-flow vessels. COMPARISON:  None Available. FINDINGS: Liver: Normal parenchymal echogenicity. Normal hepatic contour without nodularity. No focal lesion, mass or intrahepatic biliary ductal  dilatation. Gallbladder: No gallstones or wall thickening visualized. No sonographic Murphy sign noted by sonographer. Common bile duct: Diameter: 4 mm Main Portal Vein size: 0.7 cm Portal Vein Velocities Main Prox:  42 cm/sec Main Mid: 33 cm/sec Main Dist:  32 cm/sec Right: 28 cm/sec Left: 18 cm/sec Hepatic Vein Velocities Right:  18 cm/sec Middle:  33 cm/sec Left:  26 cm/sec IVC:  Present and patent with normal respiratory phasicity. Hepatic Artery Velocity:  94 cm/sec Splenic Vein Velocity:  18 cm/sec Spleen: 9.9 cm x 4.0 cm x 3.4 cm with a total volume of 70 cm^3 (411 cm^3 is upper limit normal) Portal Vein Occlusion/Thrombus: No Splenic Vein Occlusion/Thrombus: No Ascites: None Varices: None IMPRESSION: Negative right upper quadrant ultrasound. Normal Doppler evaluation of the hepatic vasculature. Electronically Signed   By: Julian Hy M.D.   On: 10/24/2022 21:45   DG Abd 1 View  Result Date: 10/24/2022 CLINICAL DATA:  Abdominal distension EXAM: ABDOMEN - 1 VIEW COMPARISON:  CT abdomen pelvis 10/24/2022 FINDINGS: Nonobstructive bowel gas pattern. No bowel dilatation or bowel wall thickening. Lung bases clear. Degenerative and postsurgical changes lumbar spine. No urinary tract calcification. IMPRESSION: No acute abnormalities. Electronically Signed   By: Lavonia Dana M.D.   On: 10/24/2022 16:38   CT ABDOMEN PELVIS WO CONTRAST  Result Date: 10/24/2022 CLINICAL DATA:  Postoperative abdominal pain. Laminectomy. Bleeding. EXAM: CT ABDOMEN AND PELVIS WITHOUT CONTRAST TECHNIQUE: Multidetector CT imaging of the abdomen and pelvis was performed following the standard protocol without IV contrast. RADIATION DOSE REDUCTION: This exam was performed according to the departmental dose-optimization program which includes automated exposure control, adjustment of the mA and/or kV according to patient size and/or use of iterative reconstruction technique. COMPARISON:  None Available. FINDINGS: Lower chest: Mild  atelectasis at the right lung base. No pleural fluid. Hepatobiliary: Liver parenchyma is normal without contrast. No calcified gallstones. Pancreas: Normal Spleen: Normal Adrenals/Urinary Tract: Adrenal glands are normal. Kidneys are normal. No evidence of mass or stone. Mild fullness of the renal collecting systems and ureters, possibly subsequent to a distended bladder. Stomach/Bowel: Stomach and small intestine are normal. Normal appendix. Normal colon. Vascular/Lymphatic: Aortic atherosclerosis. No aneurysm. IVC is normal. No adenopathy. Reproductive: Normal Other: No free fluid or air.  No hernia. Musculoskeletal: Previous lumbosacral fusion M L1 to the sacrum. Pedicle screws and posterior rods. Posterior decompression throughout the region. The spinal canal cannot be accurately assessed using this technique. No evidence of retroperitoneal bleeding. IMPRESSION: 1. Previous lumbosacral fusion from L1 to the sacrum with posterior decompression. The spinal canal cannot be accurately assessed using this technique. No evidence of retroperitoneal bleeding. 2. Mild fullness of the renal collecting systems and ureters, possibly subsequent to a distended bladder. No obstructing lesion is seen to explain the distended bladder. The prostate gland is not markedly enlarged. 3. Aortic atherosclerosis. 4. Mild atelectasis at the right lung base. Aortic Atherosclerosis (ICD10-I70.0). Electronically Signed   By: Nelson Chimes M.D.   On: 10/24/2022 15:04   DG Chest Port 1 View  Result Date: 10/24/2022 CLINICAL DATA:  Fever. EXAM: PORTABLE CHEST 1 VIEW COMPARISON:  Chest x-ray Feb 13, 2019. FINDINGS: Similar cardiomediastinal silhouette. No consolidation. No visible pleural effusions or pneumothorax. IMPRESSION: No active disease. Electronically Signed   By: Margaretha Sheffield M.D.   On: 10/24/2022 14:12   DG Lumbar Spine 1 View  Result Date: 10/19/2022 CLINICAL DATA:  Elective surgery EXAM: LUMBAR SPINE - 1 VIEW  COMPARISON:  Radiograph 07/10/2022 FINDINGS: Intraoperative lateral radiograph during L1-S1 posterior decompression and lumbosacral fusion. Hardware is intact. Multilevel degenerative disc disease. Trace residual anterolisthesis at L3-L4 and L4-L5. IMPRESSION: Intraoperative lateral radiograph during L1-S1 posterior decompression and fusion. Electronically Signed   By: Maurine Simmering M.D.   On: 10/19/2022 16:56   DG O-ARM IMAGE ONLY/NO REPORT  Result Date: 10/19/2022 There is no Radiologist interpretation  for this exam.  Latest Ref Rng & Units 10/03/2022    3:13 PM  PFT Results  FVC-Pre L 2.29   FVC-Predicted Pre % 50   FVC-Post L 2.53   FVC-Predicted Post % 55   Pre FEV1/FVC % % 77   Post FEV1/FCV % % 85   FEV1-Pre L 1.77   FEV1-Predicted Pre % 53   FEV1-Post L 2.15   DLCO uncorrected ml/min/mmHg 18.67   DLCO UNC% % 70   DLCO corrected ml/min/mmHg 18.67   DLCO COR %Predicted % 70   DLVA Predicted % 86   TLC L 6.29   TLC % Predicted % 86   RV % Predicted % 103     No results found for: "NITRICOXIDE"      Assessment & Plan:   Mild persistent asthma Previous pulmonary function testing revealed moderate obstruction with reversibility, consistent with asthma.  He also has peripheral eosinophils on previous testing.  No formal diagnosis of COPD based on ratio of 85.  He does have a reduction in diffusion capacity, consistent with emphysema.  We reviewed disease process in depth at previous OV.  We started him on ICS/LABA therapy with Breo and rx for rescue albuterol HFA. He seems to receive better benefit from the albuterol; question if this is due to delivery method? We did review proper inhaler technique again today. I am going to provide him with samples of Breztri to add LAMA therapy and give him HFA style inhaler. Will continue to monitor moving forward. Action plan in place.  Patient Instructions  Stop Breo. Trial Breztri 2 puffs Twice daily. Brush tongue and rinse mouth  afterwards. Call me and let me know if this helps and I will send a new prescription  Continue Albuterol inhaler 2 puffs every 6 hours as needed for shortness of breath or wheezing. Notify if symptoms persist despite rescue inhaler/neb use.   Follow up with your primary care provider as scheduled for the anemia  Follow up with Dr. Lamonte Sakai or Joellen Jersey Dionis Autry,NP in 3 months. If symptoms do not improve or worsen, please contact office for sooner follow up or seek emergency care.    Pulmonary nodules Lung RADS 2. Repeat CT chest November 2024.   Anemia Improving. Follow up with GI and PCP as scheduled.     I spent 32 minutes of dedicated to the care of this patient on the date of this encounter to include pre-visit review of records, face-to-face time with the patient discussing conditions above, post visit ordering of testing, clinical documentation with the electronic health record, making appropriate referrals as documented, and communicating necessary findings to members of the patients care team.  Clayton Bibles, NP 11/15/2022  Pt aware and understands NP's role.

## 2022-11-16 ENCOUNTER — Ambulatory Visit: Payer: Medicare Other | Admitting: Family

## 2022-11-20 ENCOUNTER — Ambulatory Visit: Payer: Self-pay

## 2022-11-20 NOTE — Patient Outreach (Signed)
  Care Coordination   11/20/2022 Name: Todd Mendoza MRN: [id number] DOB: December 20, 1950   Care Coordination Outreach Attempts:  An unsuccessful telephone outreach was attempted for a scheduled appointment today.  Follow Up Plan:  Additional outreach attempts will be made to offer the patient care coordination information and services.   Encounter Outcome:  No Answer   Care Coordination Interventions:  No, not indicated    Barb Merino, RN, BSN, CCM Care Management Coordinator [facility] Care Management  Direct Phone: [phone]

## 2022-11-21 ENCOUNTER — Encounter: Payer: Self-pay | Admitting: Family

## 2022-11-21 ENCOUNTER — Ambulatory Visit (INDEPENDENT_AMBULATORY_CARE_PROVIDER_SITE_OTHER): Payer: Medicare Other | Admitting: Family

## 2022-11-21 VITALS — BP 122/72 | HR 86 | Temp 97.7°F | Resp 20 | Ht 70.0 in | Wt 208.0 lb

## 2022-11-21 DIAGNOSIS — G8929 Other chronic pain: Secondary | ICD-10-CM | POA: Diagnosis not present

## 2022-11-21 DIAGNOSIS — M549 Dorsalgia, unspecified: Secondary | ICD-10-CM

## 2022-11-21 DIAGNOSIS — M25512 Pain in left shoulder: Secondary | ICD-10-CM | POA: Diagnosis not present

## 2022-11-21 DIAGNOSIS — E782 Mixed hyperlipidemia: Secondary | ICD-10-CM | POA: Diagnosis not present

## 2022-11-21 DIAGNOSIS — I10 Essential (primary) hypertension: Secondary | ICD-10-CM

## 2022-11-21 NOTE — Patient Instructions (Addendum)
Schedule AWV - Please get left shoulder X-ray at [place] at [facility] then will call you with results.

## 2022-11-21 NOTE — Progress Notes (Signed)
Provider: Marlowe Sax FNP-C   Lena Fieldhouse, Nelda Bucks, NP  Patient Care Team: Kamar Callender, Nelda Bucks, NP as PCP - General (Family Medicine) Judith Part, MD as Consulting Physician (Neurosurgery) Rex Kras Claudette Stapler, RN as [place] Management  Extended Emergency Contact Information Primary Emergency Contact: [place] Address: [address]          [place], [place] [address] [place] Mobile Phone: [phone] Relation: Spouse  Code Status:  Full Code  Goals of care: Advanced Directive information    10/26/2022    8:35 AM  Advanced Directives  Does Patient Have a Medical Advance Directive? No     Chief Complaint  Patient presents with   Medical Management of Chronic Issues    Patient presents today for 3 months follow-up   Quality Metric Gaps    AWV and COVID#3    HPI:  Pt is a 72 y.o. Mendoza seen today for 3 months follow up for medical management of chronic diseases. Here with wife who provides additional HPI information.    Lower back pain - s/p surgery L1-S 1 decompression and PSIF done 10/19/2022 by Dr.Thomas Ostergard. states sciatic has improved.No weakness or numbness on lower extremities.  Left shoulder pain - woke up unable to raise hand able. Has seen Dr.Clark on right shoulder given steroid injection. With improvement.   Past Medical History:  Diagnosis Date   Arthritis    Asthma    Hepatitis    treated for Hep C in the past   History of substance abuse ([place])    cocaine- over 20 years ago   Hyperlipidemia    Hypertension    Stroke [facility])    Vertigo    Past Surgical History:  Procedure Laterality Date   BIOPSY  10/26/2022   Procedure: BIOPSY;  Surgeon: Jerene Bears, MD;  Location: [facility] ENDOSCOPY;  Service: Gastroenterology;;   COLONOSCOPY     ESOPHAGOGASTRODUODENOSCOPY (EGD) WITH PROPOFOL N/A 10/26/2022   Procedure: ESOPHAGOGASTRODUODENOSCOPY (EGD) WITH PROPOFOL;  Surgeon: Jerene Bears, MD;  Location: [facility] ENDOSCOPY;   Service: Gastroenterology;  Laterality: N/A;   LAMINECTOMY WITH POSTERIOR LATERAL ARTHRODESIS LEVEL 4 N/A 10/19/2022   Procedure: Lumbar one to Sacral one Laminectomies with Lumbar one to Sacral one Posterolateral instrumented fusion;  Surgeon: Judith Part, MD;  Location: [place];  Service: Neurosurgery;  Laterality: N/A;   MULTIPLE TOOTH EXTRACTIONS     NO PAST SURGERIES      No Known Allergies  Allergies as of 11/21/2022   No Known Allergies      Medication List        Accurate as of November 21, 2022 10:31 AM. If you have any questions, ask your nurse or doctor.          albuterol 108 (90 Base) MCG/ACT inhaler Commonly known as: VENTOLIN HFA Inhale 2 puffs into the lungs every 6 (six) hours as needed for wheezing or shortness of breath.   amLODipine 5 MG tablet Commonly known as: NORVASC TAKE 1 TABLET (5 MG TOTAL) BY MOUTH DAILY.   aspirin EC 81 MG tablet Take 1 tablet (81 mg total) by mouth daily.   atorvastatin 40 MG tablet Commonly known as: LIPITOR Take 40 mg by mouth daily.   Breztri Aerosphere 160-9-4.8 MCG/ACT Aero Generic drug: Budeson-Glycopyrrol-Formoterol Inhale 2 puffs into the lungs in the morning and at bedtime.   fenofibrate 145 MG tablet Commonly known as: Tricor Take 1 tablet (145 mg total) by mouth daily.  finasteride 5 MG tablet Commonly known as: PROSCAR TAKE 1 TABLET (5 MG TOTAL) BY MOUTH DAILY.   fluconazole 200 MG tablet Commonly known as: DIFLUCAN Take 1 tablet (200 mg total) by mouth daily.   gabapentin 300 MG capsule Commonly known as: NEURONTIN Take 300 mg by mouth 3 (three) times daily.   hydrochlorothiazide 25 MG tablet Commonly known as: HYDRODIURIL TAKE 1 TABLET (25 MG TOTAL) BY MOUTH DAILY.   methocarbamol 500 MG tablet Commonly known as: ROBAXIN Take 1 tablet (500 mg total) by mouth 4 (four) times daily.   multivitamin with minerals tablet Take 1 tablet by mouth daily.   pantoprazole 40 MG tablet Commonly  known as: PROTONIX Take 1 tablet (40 mg total) by mouth 2 (two) times daily.   silodosin 8 MG Caps capsule Commonly known as: RAPAFLO Take 8 mg by mouth daily.        Review of Systems  Constitutional:  Negative for appetite change, chills, fatigue, fever and unexpected weight change.  HENT:  Negative for congestion, dental problem, ear discharge, ear pain, facial swelling, hearing loss, nosebleeds, postnasal drip, rhinorrhea, sinus pressure, sinus pain, sneezing, sore throat, tinnitus and trouble swallowing.   Eyes:  Negative for pain, discharge, redness, itching and visual disturbance.  Respiratory:  Negative for cough, chest tightness, shortness of breath and wheezing.   Cardiovascular:  Negative for chest pain, palpitations and leg swelling.  Gastrointestinal:  Negative for abdominal distention, abdominal pain, blood in stool, constipation, diarrhea, nausea and vomiting.  Endocrine: Negative for cold intolerance, heat intolerance, polydipsia, polyphagia and polyuria.  Genitourinary:  Negative for difficulty urinating, dysuria, flank pain, frequency and urgency.  Musculoskeletal:  Positive for arthralgias. Negative for back pain, gait problem, joint swelling, myalgias, neck pain and neck stiffness.       Left shoulder pain   Skin:  Negative for color change, pallor, rash and wound.  Neurological:  Negative for dizziness, syncope, speech difficulty, weakness, light-headedness, numbness and headaches.  Hematological:  Does not bruise/bleed easily.  Psychiatric/Behavioral:  Negative for agitation, behavioral problems, confusion, hallucinations, self-injury, sleep disturbance and suicidal ideas. The patient is not nervous/anxious.     Immunization History  Administered Date(s) Administered   Fluad Quad(high Dose 65+) 06/24/2019, 06/21/2020   Influenza, High Dose Seasonal PF 09/17/2018   Influenza-Unspecified 06/01/2021, 08/10/2022   PFIZER(Purple Top)SARS-COV-2 Vaccination 11/24/2019,  12/23/2019   Pneumococcal Conjugate-13 04/11/2016   Pneumococcal Polysaccharide-23 05/15/2017   Tdap 04/02/2017   Zoster Recombinat (Shingrix) 10/18/2020, 12/20/2020   Pertinent  Health Maintenance Due  Topic Date Due   COLONOSCOPY (Pts 45-34yr Insurance coverage will need to be confirmed)  07/27/2024   INFLUENZA VACCINE  Completed      05/18/2021    9:00 AM 08/01/2021    2:34 PM 09/15/2021    9:21 AM 08/14/2022    9:31 AM 10/11/2022   11:04 AM  Fall Risk  Falls in the past year? 0 0 0 0 0  Was there an injury with Fall? 0 0 0 0 0  Fall Risk Category Calculator 0 0 0 0 0  Fall Risk Category (Retired) Low Low Low Low   (RETIRED) Patient Fall Risk Level Low fall risk   Low fall risk   Patient at Risk for Falls Due to    No Fall Risks No Fall Risks  Fall risk Follow up    Falls evaluation completed Falls evaluation completed   Functional Status Survey:    Vitals:   11/21/22 0941  BP: 122/72  Pulse: 86  Resp: 20  Temp: 97.7 F (36.5 C)  SpO2: 97%  Weight: 208 lb (94.3 kg)  Height: '5\' 10"'$  (1.778 m)   Body mass index is 29.84 kg/m. Physical Exam Vitals reviewed.  Constitutional:      General: He is not in acute distress.    Appearance: Normal appearance. He is overweight. He is not ill-appearing or diaphoretic.  HENT:     Head: Normocephalic.     Right Ear: Tympanic membrane, ear canal and external ear normal. There is no impacted cerumen.     Left Ear: Tympanic membrane, ear canal and external ear normal. There is no impacted cerumen.     Nose: Nose normal. No congestion or rhinorrhea.     Mouth/Throat:     Mouth: Mucous membranes are moist.     Pharynx: Oropharynx is clear. No oropharyngeal exudate or posterior oropharyngeal erythema.  Eyes:     General: No scleral icterus.       Right eye: No discharge.        Left eye: No discharge.     Extraocular Movements: Extraocular movements intact.     Conjunctiva/sclera: Conjunctivae normal.     Pupils: Pupils are  equal, round, and reactive to light.  Neck:     Vascular: No carotid bruit.  Cardiovascular:     Rate and Rhythm: Normal rate and regular rhythm.     Pulses: Normal pulses.     Heart sounds: Normal heart sounds. No murmur heard.    No friction rub. No gallop.  Pulmonary:     Effort: Pulmonary effort is normal. No respiratory distress.     Breath sounds: Normal breath sounds. No wheezing, rhonchi or rales.  Chest:     Chest wall: No tenderness.  Abdominal:     General: Bowel sounds are normal. There is no distension.     Palpations: Abdomen is soft. There is no mass.     Tenderness: There is no abdominal tenderness. There is no right CVA tenderness, left CVA tenderness, guarding or rebound.  Musculoskeletal:        General: No swelling.     Right shoulder: Normal.     Left shoulder: Tenderness present. No swelling, effusion or crepitus. Decreased range of motion. Normal strength. Normal pulse.     Cervical back: Normal range of motion. No rigidity or tenderness.     Right lower leg: No edema.     Left lower leg: No edema.  Lymphadenopathy:     Cervical: No cervical adenopathy.  Skin:    General: Skin is warm and dry.     Coloration: Skin is not pale.     Findings: No bruising, erythema, lesion or rash.  Neurological:     Mental Status: He is alert and oriented to person, place, and time.     Cranial Nerves: No cranial nerve deficit.     Sensory: No sensory deficit.     Motor: No weakness.     Coordination: Coordination normal.     Gait: Gait normal.  Psychiatric:        Mood and Affect: Mood normal.        Speech: Speech normal.        Behavior: Behavior normal.        Thought Content: Thought content normal.        Judgment: Judgment normal.     Labs reviewed: Recent Labs    10/25/22 0533 10/27/22 0631 11/13/22 0817  NA 136 137 142  K 3.2* 3.5 4.5  CL 98 106 107  CO2 '27 23 25  '$ GLUCOSE 100* 88 88  BUN '19 14 17  '$ CREATININE 1.43* 1.10 1.10  CALCIUM 8.8* 8.7*  9.5   Recent Labs    10/05/22 1000 10/24/22 1237 10/25/22 0533 11/13/22 0817  AST 42* 145* 106* 23  ALT 25 53* 48* 15  ALKPHOS 43 63 61  --   BILITOT 0.9 2.2* 3.0* 0.4  PROT 8.1 7.2 6.7 7.9  ALBUMIN 4.3 3.1* 2.9*  --    Recent Labs    07/10/22 0803 10/05/22 1000 10/24/22 0515 10/24/22 1800 10/26/22 1652 10/27/22 0631 11/13/22 0817  WBC 5.1 5.7 8.9  --   --   --  6.8  NEUTROABS 1,984  --   --   --   --   --  3,108  HGB 12.8* 12.4* 7.8*   < > 7.7* 7.4* 9.1*  HCT 39.4 40.4 24.3*   < > 22.5* 22.1* 28.7*  MCV 88.7 92.4 88.4  --   --   --  87.5  PLT 252 238 234  --   --   --  392   < > = values in this interval not displayed.   Lab Results  Component Value Date   TSH 1.97 11/13/2022   Lab Results  Component Value Date   HGBA1C 5.4 10/13/2019   Lab Results  Component Value Date   CHOL 84 11/13/2022   HDL 35 (L) 11/13/2022   LDLCALC 33 11/13/2022   LDLDIRECT 62.0 10/09/2018   TRIG 75 11/13/2022   CHOLHDL 2.4 11/13/2022    Significant Diagnostic Results in last 30 days:  US ABDOMEN LIMITED WITH LIVER DOPPLER  Result Date: 10/24/2022 CLINICAL DATA:  Elevated LFTs EXAM: ULTRASOUND ABDOMEN LIMITED RIGHT UPPER QUADRANT DUPLEX ULTRASOUND OF LIVER TECHNIQUE: Limited abdominal ultrasound of the right upper abdomen. Color and duplex Doppler ultrasound was performed to evaluate the hepatic in-flow and out-flow vessels. COMPARISON:  None Available. FINDINGS: Liver: Normal parenchymal echogenicity. Normal hepatic contour without nodularity. No focal lesion, mass or intrahepatic biliary ductal dilatation. Gallbladder: No gallstones or wall thickening visualized. No sonographic Murphy sign noted by sonographer. Common bile duct: Diameter: 4 mm Main Portal Vein size: 0.7 cm Portal Vein Velocities Main Prox:  42 cm/sec Main Mid: 33 cm/sec Main Dist:  32 cm/sec Right: 28 cm/sec Left: 18 cm/sec Hepatic Vein Velocities Right:  18 cm/sec Middle:  33 cm/sec Left:  26 cm/sec IVC: Present and  patent with normal respiratory phasicity. Hepatic Artery Velocity:  94 cm/sec Splenic Vein Velocity:  18 cm/sec Spleen: 9.9 cm x 4.0 cm x 3.4 cm with a total volume of 70 cm^3 (411 cm^3 is upper limit normal) Portal Vein Occlusion/Thrombus: No Splenic Vein Occlusion/Thrombus: No Ascites: None Varices: None IMPRESSION: Negative right upper quadrant ultrasound. Normal Doppler evaluation of the hepatic vasculature. Electronically Signed   By: Julian Hy M.D.   On: 10/24/2022 21:45   DG Abd 1 View  Result Date: 10/24/2022 CLINICAL DATA:  Abdominal distension EXAM: ABDOMEN - 1 VIEW COMPARISON:  CT abdomen pelvis 10/24/2022 FINDINGS: Nonobstructive bowel gas pattern. No bowel dilatation or bowel wall thickening. Lung bases clear. Degenerative and postsurgical changes lumbar spine. No urinary tract calcification. IMPRESSION: No acute abnormalities. Electronically Signed   By: Lavonia Dana M.D.   On: 10/24/2022 16:38   CT ABDOMEN PELVIS WO CONTRAST  Result Date: 10/24/2022 CLINICAL DATA:  Postoperative abdominal pain. Laminectomy. Bleeding. EXAM: CT ABDOMEN AND PELVIS WITHOUT CONTRAST  TECHNIQUE: Multidetector CT imaging of the abdomen and pelvis was performed following the standard protocol without IV contrast. RADIATION DOSE REDUCTION: This exam was performed according to the departmental dose-optimization program which includes automated exposure control, adjustment of the mA and/or kV according to patient size and/or use of iterative reconstruction technique. COMPARISON:  None Available. FINDINGS: Lower chest: Mild atelectasis at the right lung base. No pleural fluid. Hepatobiliary: Liver parenchyma is normal without contrast. No calcified gallstones. Pancreas: Normal Spleen: Normal Adrenals/Urinary Tract: Adrenal glands are normal. Kidneys are normal. No evidence of mass or stone. Mild fullness of the renal collecting systems and ureters, possibly subsequent to a distended bladder. Stomach/Bowel: Stomach  and small intestine are normal. Normal appendix. Normal colon. Vascular/Lymphatic: Aortic atherosclerosis. No aneurysm. IVC is normal. No adenopathy. Reproductive: Normal Other: No free fluid or air.  No hernia. Musculoskeletal: Previous lumbosacral fusion M L1 to the sacrum. Pedicle screws and posterior rods. Posterior decompression throughout the region. The spinal canal cannot be accurately assessed using this technique. No evidence of retroperitoneal bleeding. IMPRESSION: 1. Previous lumbosacral fusion from L1 to the sacrum with posterior decompression. The spinal canal cannot be accurately assessed using this technique. No evidence of retroperitoneal bleeding. 2. Mild fullness of the renal collecting systems and ureters, possibly subsequent to a distended bladder. No obstructing lesion is seen to explain the distended bladder. The prostate gland is not markedly enlarged. 3. Aortic atherosclerosis. 4. Mild atelectasis at the right lung base. Aortic Atherosclerosis (ICD10-I70.0). Electronically Signed   By: Nelson Chimes M.D.   On: 10/24/2022 15:04   DG Chest Port 1 View  Result Date: 10/24/2022 CLINICAL DATA:  Fever. EXAM: PORTABLE CHEST 1 VIEW COMPARISON:  Chest x-ray Feb 13, 2019. FINDINGS: Similar cardiomediastinal silhouette. No consolidation. No visible pleural effusions or pneumothorax. IMPRESSION: No active disease. Electronically Signed   By: Margaretha Sheffield M.D.   On: 10/24/2022 14:12    Assessment/Plan 1. Chronic left shoulder pain Has limited ROM raising hand above head or reaching the back.tender to palpate.No weakness on the hand. - will obtain imaging then refer to Orthopedic for further evaluation of pain. - continue current pain regimen - DG Shoulder Left; Future  2. Essential hypertension B/p well controlled  -Continue on hydrochlorothiazide and amlodipine -Continue on aspirin and atorvastatin for cardiovascular event prevention - TSH; Future - COMPLETE METABOLIC PANEL WITH  GFR; Future - CBC with Differential/Platelet; Future  3. Mixed hyperlipidemia Latest LDL at goal Continue on atorvastatin,dietary and lifestyle modification - Lipid panel; Future  4. Mid back pain, chronic s/p surgery L1-S 1 decompression and PSIF done 10/19/2022 by Dr.Thomas Ostergard.  Symptoms sounds improved postsurgery. Continue to monitor  Family/ staff Communication: Reviewed plan of care with patient and wife verbalized understanding  Labs/tests ordered: - DG Shoulder Left; Future - TSH; Future - COMPLETE METABOLIC PANEL WITH GFR; Future - CBC with Differential/Platelet; Future - Lipid panel; Future  Next Appointment : Return in about 6 months (around 05/22/2023) for medical mangement of chronic issues.Sandrea Hughs, NP

## 2022-11-22 ENCOUNTER — Ambulatory Visit
Admission: RE | Admit: 2022-11-22 | Discharge: 2022-11-22 | Disposition: A | Discharge status: Home / Self Care | Payer: Medicare Other | Source: Ambulatory Visit | Attending: Family | Admitting: Family

## 2022-11-22 DIAGNOSIS — M19012 Primary osteoarthritis, left shoulder: Secondary | ICD-10-CM | POA: Diagnosis not present

## 2022-11-22 DIAGNOSIS — G8929 Other chronic pain: Secondary | ICD-10-CM

## 2022-11-27 ENCOUNTER — Telehealth: Payer: Self-pay | Admitting: Nurse Practitioner

## 2022-11-27 ENCOUNTER — Other Ambulatory Visit: Payer: Self-pay

## 2022-11-27 MED ORDER — BREZTRI AEROSPHERE 160-9-4.8 MCG/ACT IN AERO: 2.0000 | INHALATION_SPRAY | Freq: Two times a day (BID) | RESPIRATORY_TRACT | 6 refills | Status: DC

## 2022-11-27 NOTE — Telephone Encounter (Signed)
Pt. Needs Breztri called to pharmacy its working week for pt. Only got sample in office

## 2022-11-27 NOTE — Telephone Encounter (Signed)
Spoke with patient. Refill for Todd Mendoza has been sent to pharmacy. Nothing further needed.

## 2022-11-27 NOTE — Telephone Encounter (Signed)
Please advise if your okay with Todd Mendoza being sent to pharmacy. Patient advises it is working well for him

## 2022-11-27 NOTE — Telephone Encounter (Signed)
Yes, please send rx for Breztri 2 puffs Twice daily. Brush tongue and rinse mouth afterwards. Ok to send refills x 6 months. Thanks.

## 2022-11-29 ENCOUNTER — Telehealth: Payer: Self-pay | Admitting: *Deleted

## 2022-11-29 NOTE — Progress Notes (Signed)
  Care Coordination Note  11/29/2022 Name: Thaddaeus Capobianco MRN: [id number] DOB: Mar 20, 1951  Alias Grahn is a 72 y.o. year old male who is a primary care patient of Ngetich, [place], NP and is actively engaged with the care management team. I reached out to [name] by phone today to assist with re-scheduling a follow up visit with the RN Case Manager  Follow up plan: Unsuccessful telephone outreach attempt made. A HIPAA compliant phone message was left for the patient providing contact information and requesting a return call.   [place]  Direct Dial: [phone]

## 2022-11-30 ENCOUNTER — Encounter: Payer: Self-pay | Admitting: Radiology

## 2022-12-01 ENCOUNTER — Telehealth: Payer: Self-pay

## 2022-12-01 DIAGNOSIS — G8929 Other chronic pain: Secondary | ICD-10-CM

## 2022-12-01 NOTE — Telephone Encounter (Signed)
This encounter was created in error - please disregard.

## 2022-12-01 NOTE — Telephone Encounter (Signed)
Patient's wife Todd Mendoza called stating that referral was supposed to have been placed for patient to see orthopedic for injection to shoulder. Patient would like to see Benita Stabile PA-C at [facility] in [place].  Patient is having a lot of pain in shoulder.  Message routed to Marlowe Sax, NP

## 2022-12-01 NOTE — Telephone Encounter (Signed)
Left shoulder x-ray reviewed.will send referral as requested.

## 2022-12-01 NOTE — Progress Notes (Signed)
  Care Coordination Note  12/01/2022 Name: Todd Mendoza MRN: [id number] DOB: 04-09-1951  Todd Mendoza is a 72 y.o. year old male who is a primary care patient of Ngetich, [place], NP and is actively engaged with the care management team. I reached out to [name] by phone today to assist with re-scheduling a follow up visit with the RN Case Manager  Follow up plan: Telephone appointment with care management team member scheduled for:3/21 [place]  Direct Dial: [phone]

## 2022-12-04 ENCOUNTER — Other Ambulatory Visit: Payer: Self-pay | Admitting: Family

## 2022-12-04 DIAGNOSIS — G8929 Other chronic pain: Secondary | ICD-10-CM

## 2022-12-05 ENCOUNTER — Ambulatory Visit: Payer: Medicare Other | Admitting: Family

## 2022-12-07 ENCOUNTER — Ambulatory Visit (INDEPENDENT_AMBULATORY_CARE_PROVIDER_SITE_OTHER): Payer: Medicare Other | Admitting: Physician Assistant

## 2022-12-07 ENCOUNTER — Encounter: Payer: Self-pay | Admitting: Physician Assistant

## 2022-12-07 DIAGNOSIS — M25512 Pain in left shoulder: Secondary | ICD-10-CM

## 2022-12-07 NOTE — Progress Notes (Signed)
HPI: Mr. Flatten comes in today with left shoulder pain.  He is asking for injection in his shoulder.  Had similar pain which she states is very much the same pain as he was having in his right shoulder.  Right shoulder cuff but better after an intra articular injection.  Subacromial injection gave him no relief.  MRI of the right shoulder did show that he had moderate arthritis of the shoulder. Left shoulder pain began 2 months ago.  He has had no injury.  Notes decreased range of motion of the shoulder.  He is taking over-the-counter medications without any relief.  He is nondiabetic.  Denies any radicular symptoms down the arm.  Notes no weakness of the arm. Radiographs of his left shoulder dated 11/23/2022 reviewed and shows some arthritic changes of his AC joint.  And mild degenerative changes of his glenohumeral joint.  Shoulder is well located.  No acute fractures or bony abnormalities otherwise.  Review of systems see HPI otherwise negative  Physical exam: General well-developed well-nourished male no acute distress mood affect appropriate Respirations: Unlabored Bilateral shoulders 5 of 5 strength external and internal rotation against resistance empty can test is negative bilaterally.  Liftoff test negative on the left.  He has restricted external rotation of both shoulders.  No significant crepitus with range of motion of both shoulders.  External rotation of the left shoulder causes pain no pain with external rotation of the right shoulder.  Impression: Left shoulder arthritis  Plan: Recommend intra articular injection of the left shoulder.  Will see him back 2 weeks after the injection to see what type of response he had.  Questions were encouraged and answered at length.  Will set this up with [name] under ultrasound in the near future.

## 2022-12-11 ENCOUNTER — Ambulatory Visit: Payer: Self-pay

## 2022-12-11 DIAGNOSIS — R338 Other retention of urine: Secondary | ICD-10-CM | POA: Diagnosis not present

## 2022-12-11 DIAGNOSIS — N3000 Acute cystitis without hematuria: Secondary | ICD-10-CM | POA: Diagnosis not present

## 2022-12-11 DIAGNOSIS — R3912 Poor urinary stream: Secondary | ICD-10-CM | POA: Diagnosis not present

## 2022-12-11 NOTE — Patient Outreach (Signed)
  Care Coordination   12/11/2022 Name: Todd Mendoza MRN: [id number] DOB: 1951-03-04   Care Coordination Outreach Attempts:  An unsuccessful telephone outreach was attempted for a scheduled appointment today.  Follow Up Plan:  Additional outreach attempts will be made to offer the patient care coordination information and services.   Encounter Outcome:  No Answer   Care Coordination Interventions:  No, not indicated    Barb Merino, RN, BSN, CCM Care Management Coordinator [facility] Care Management Direct Phone: [phone]

## 2022-12-13 ENCOUNTER — Other Ambulatory Visit: Payer: Self-pay

## 2022-12-13 ENCOUNTER — Ambulatory Visit: Payer: Medicare Other | Admitting: Physician Assistant

## 2022-12-13 ENCOUNTER — Encounter: Payer: Self-pay | Admitting: Physician Assistant

## 2022-12-13 DIAGNOSIS — M25512 Pain in left shoulder: Secondary | ICD-10-CM | POA: Diagnosis not present

## 2022-12-13 DIAGNOSIS — M545 Low back pain, unspecified: Secondary | ICD-10-CM | POA: Insufficient documentation

## 2022-12-13 DIAGNOSIS — M19012 Primary osteoarthritis, left shoulder: Secondary | ICD-10-CM | POA: Insufficient documentation

## 2022-12-13 NOTE — Progress Notes (Unsigned)
Office Visit Note   Patient: Todd Mendoza           Date of Birth: 1951-01-22           MRN: [id number] Visit Date: 12/13/2022              Requested by: Todd Hughs, NP [address] [place],  [place] [address] PCP: Todd Hughs, NP   Assessment & Plan: Visit Diagnoses:  1. Acute pain of left shoulder     Plan: Todd Mendoza is a pleasant 72 year old gentleman who is followed by Todd Mendoza.  He saw Todd Mendoza a couple weeks ago complaining of left shoulder pain.  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  He has had a similar problem in his right shoulder and a subacromial injection was not helpful however a glenohumeral injection helped him quite a bit.  He was referred to Todd Mendoza but came in today wondering if he could get this injection.  He does have good rotator cuff strength with resisted external/internal rotation and abduction.  He does have forward elevation about 110 degrees.  He has global pain anteriorly and posteriorly by his description.  He does have reproducible pain in the glenohumeral joint with external rotation.  Discussed the case with Todd Mendoza he was willing to go forward with an ultrasound-guided injection today.  Follow-Up Instructions: Return if symptoms worsen or fail to improve.   Orders:  Orders Placed This Encounter  Procedures   US Guided Needle Placement - No Linked Charges   No orders of the defined types were placed in this encounter.     Procedures: No procedures performed   Clinical Data: No additional findings.   Subjective: Chief Complaint  Patient presents with   Left Shoulder - Pain    HPI patient is a pleasant 72 year old gentleman who is a patient of Todd Mendoza.  He has a history of left shoulder pain.  Will examine him a couple weeks ago findings are more consistent with glenohumeral arthritis then impingement.  He had a similar issue on the right and benefited well from a glenohumeral injection.  He was to be referred to Dr.  Rolena Mendoza.  He comes in today thinking that he was going to get that injection today.  Review of Systems  All other systems reviewed and are negative.    Objective: Vital Signs: There were no vitals taken for this visit.  Physical Exam Constitutional:      Appearance: Normal appearance.  HENT:     Head: Normocephalic.  Pulmonary:     Effort: Pulmonary effort is normal.  Neurological:     General: No focal deficit present.     Mental Status: He is alert.     Ortho Exam Examination of his left shoulder he has good grip strength neurovascularly intact he can forward elevate to 110 degrees negative drop arm test.  He has good strength with resisted abduction external and internal rotation does have some tenderness to deep palpation and with external rotation passively and Specialty Comments:  No specialty comments available.  Imaging: No results found.   PMFS History: Patient Active Problem List   Diagnosis Date Noted   Hiccups 10/26/2022   Acute esophagitis 10/26/2022   Esophageal candidiasis (IXL) 10/26/2022   Drug-induced constipation 10/25/2022   Ileus ([place]) 10/24/2022   Acute blood loss anemia 10/24/2022   Stool guaiac positive 10/24/2022   Abdominal distention 10/24/2022   Fever 10/24/2022   AKI (acute kidney injury) ([place]) 10/24/2022  COPD (chronic obstructive pulmonary disease) ([place]) 10/24/2022   Elevated liver enzymes 10/24/2022   BPH (benign prostatic hyperplasia) 10/24/2022   Radiculopathy of lumbar region 10/19/2022   Mild persistent asthma 10/04/2022   Pulmonary nodules 08/23/2022   Chronic right shoulder pain 10/04/2020   Frequent headaches 10/13/2019   Hyperglycemia 10/11/2019   Hepatitis C antibody test positive 06/26/2019   DOE (dyspnea on exertion) 02/13/2019   Overweight (BMI 25.0-29.9) 02/13/2019   Anemia 04/30/2018   ED (erectile dysfunction) 11/03/2016   Lumbar radiculopathy 06/30/2016   Family history of diabetes mellitus 03/14/2016    Encounter for prostate cancer screening 03/14/2016   H/O: CVA (cerebrovascular accident) 12/26/2012   HTN (hypertension) 12/26/2012   HLD (hyperlipidemia) 12/26/2012   Allergic rhinitis 12/26/2012   Past Medical History:  Diagnosis Date   Arthritis    Asthma    Hepatitis    treated for Hep C in the past   History of substance abuse ([place])    cocaine- over 20 years ago   Hyperlipidemia    Hypertension    Stroke ([place])    Vertigo     Family History  Problem Relation Age of Onset   Diabetes Father    Colon cancer Neg Hx    Colon polyps Neg Hx    Esophageal cancer Neg Hx    Stomach cancer Neg Hx    Rectal cancer Neg Hx     Past Surgical History:  Procedure Laterality Date   BIOPSY  10/26/2022   Procedure: BIOPSY;  Surgeon: Todd Bears, MD;  Location: [facility] ENDOSCOPY;  Service: Gastroenterology;;   COLONOSCOPY     ESOPHAGOGASTRODUODENOSCOPY (EGD) WITH PROPOFOL N/A 10/26/2022   Procedure: ESOPHAGOGASTRODUODENOSCOPY (EGD) WITH PROPOFOL;  Surgeon: Todd Bears, MD;  Location: [facility] ENDOSCOPY;  Service: Gastroenterology;  Laterality: N/A;   LAMINECTOMY WITH POSTERIOR LATERAL ARTHRODESIS LEVEL 4 N/A 10/19/2022   Procedure: Lumbar one to Sacral one Laminectomies with Lumbar one to Sacral one Posterolateral instrumented fusion;  Surgeon: Todd Part, MD;  Location: [place] [facility];  Service: Neurosurgery;  Laterality: N/A;   MULTIPLE TOOTH EXTRACTIONS     NO PAST SURGERIES     Social History   Occupational History   Occupation: Administrator, [employer]  Tobacco Use   Smoking status: Former    Packs/day: .2    Types: Cigarettes   Smokeless tobacco: Never  Vaping Use   Vaping Use: Never used  Substance and Sexual Activity   Alcohol use: No    Alcohol/week: 0.0 standard drinks of alcohol   Drug use: Not Currently    Types: Cocaine    Comment: 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

## 2022-12-13 NOTE — Progress Notes (Signed)
   Procedure Note  Patient: Todd Mendoza             Date of Birth: [birth year]           MRN: [id number]             Visit Date: 12/13/2022  Procedures: Visit Diagnoses:  1. Acute pain of left shoulder   2. Arthritis of left shoulder region     Large Joint Inj: L glenohumeral on 12/13/2022 5:50 PM Indications: diagnostic evaluation and pain Details: 22 G 1.5 in and 3.5 in needle, ultrasound-guided posterior approach  Arthrogram: No  Medications: 9 mL bupivacaine 0.5 %; 40 mg methylPREDNISolone acetate 40 MG/ML; 5 mL lidocaine 1 % Outcome: tolerated well, no immediate complications Procedure, treatment alternatives, risks and benefits explained, specific risks discussed. Consent was given by the patient. Immediately prior to procedure a time out was called to verify the correct patient, procedure, equipment, support staff and site/side marked as required. Patient was prepped and draped in the usual sterile fashion.

## 2022-12-14 ENCOUNTER — Encounter: Payer: Self-pay | Admitting: Physician Assistant

## 2022-12-14 ENCOUNTER — Ambulatory Visit: Payer: Self-pay

## 2022-12-14 MED ORDER — METHYLPREDNISOLONE ACETATE 40 MG/ML IJ SUSP
40.0000 mg | INTRAMUSCULAR | Status: AC | PRN
  Administered 2022-12-13: 40 mg via INTRA_ARTICULAR

## 2022-12-14 MED ORDER — LIDOCAINE HCL 1 % IJ SOLN
5.0000 mL | INTRAMUSCULAR | Status: AC | PRN
  Administered 2022-12-13: 5 mL

## 2022-12-14 MED ORDER — BUPIVACAINE HCL 0.5 % IJ SOLN
9.0000 mL | INTRAMUSCULAR | Status: AC | PRN
  Administered 2022-12-13: 9 mL via INTRA_ARTICULAR

## 2022-12-14 NOTE — Patient Outreach (Signed)
  Care Coordination   Initial Visit Note   12/14/2022 Name: Todd Mendoza MRN: [id number] DOB: 21-Feb-1951  Todd Mendoza is a 72 y.o. year old male who sees Ngetich, Dinah C, NP for primary care. I spoke with  Todd Mendoza by phone today.  What matters to the patients health and wellness today?  Patient will report persistent shoulder pain to his Orthopedic MD. He will request outpatient PT if the pain has not resolved following Cortizone injection.    Goals Addressed             This Visit's Progress    To better manage left shoulder pain       Care Coordination Interventions: Reviewed provider established plan for pain management Discussed importance of adherence to all scheduled medical appointments Counseled on the importance of reporting any/all new or changed pain symptoms or management strategies to pain management provider Advised patient to report to care team affect of pain on daily activities Discussed use of relaxation techniques and/or diversional activities to assist with pain reduction (distraction, imagery, relaxation, massage, acupressure, TENS, heat, and cold application Reviewed with patient prescribed pharmacological and nonpharmacological pain relief strategies Advised patient to discuss PT referral  with provider Assessed social determinant of health barriers Educated patient about the PREP program, mailed printed brochure    Interventions Today    Flowsheet Row Most Recent Value  Chronic Disease   Chronic disease during today's visit Other  [left shoulder, lower back]  General Interventions   General Interventions Discussed/Reviewed General Interventions Discussed, General Interventions Reviewed, Doctor Visits  Doctor Visits Discussed/Reviewed Doctor Visits Discussed, Doctor Visits Reviewed, Specialist  Exercise Interventions   Exercise Discussed/Reviewed Physical Activity  Physical Activity Discussed/Reviewed Physical Activity Discussed, PREP  Education  Interventions   Education Provided Provided Education, Provided Printed Education  Provided Verbal Education On Exercise, When to see the doctor          SDOH assessments and interventions completed:  Yes  SDOH Interventions Today    Flowsheet Row Most Recent Value  SDOH Interventions   Transportation Interventions Intervention Not Indicated        Care Coordination Interventions:  Yes, provided   Follow up plan: Follow up call scheduled for 01/11/23 @12 :30 PM    Encounter Outcome:  Pt. Visit Completed

## 2022-12-14 NOTE — Patient Instructions (Signed)
Visit Information  Thank you for taking time to visit with me today. Please don't hesitate to contact me if I can be of assistance to you.   Following are the goals we discussed today:   Goals Addressed             This Visit's Progress    To better manage left shoulder pain       Care Coordination Interventions: Reviewed provider established plan for pain management Discussed importance of adherence to all scheduled medical appointments Counseled on the importance of reporting any/all new or changed pain symptoms or management strategies to pain management provider Advised patient to report to care team affect of pain on daily activities Discussed use of relaxation techniques and/or diversional activities to assist with pain reduction (distraction, imagery, relaxation, massage, acupressure, TENS, heat, and cold application Reviewed with patient prescribed pharmacological and nonpharmacological pain relief strategies Advised patient to discuss PT referral  with provider Assessed social determinant of health barriers           Our next appointment is by telephone on 01/11/23 at 12:30 PM  Please call the care guide team at [phone] if you need to cancel or reschedule your appointment.   If you are experiencing a Mental Health or [place] or need someone to talk to, please call 1-800-273-TALK (toll free, 24 hour hotline) go to [facility] [address] [place] [phone])  Patient verbalizes understanding of instructions and care plan provided today and agrees to view in [place]. Active MyChart status and patient understanding of how to access instructions and care plan via MyChart confirmed with patient.     Barb Merino, RN, BSN, CCM Care Management Coordinator [facility]  Direct Phone: [phone]

## 2023-01-01 ENCOUNTER — Ambulatory Visit: Payer: Medicare Other | Admitting: Physician Assistant

## 2023-01-02 ENCOUNTER — Other Ambulatory Visit: Payer: Self-pay | Admitting: Nurse Practitioner

## 2023-01-02 DIAGNOSIS — E781 Pure hyperglyceridemia: Secondary | ICD-10-CM

## 2023-01-09 ENCOUNTER — Encounter: Payer: Medicare Other | Admitting: Family

## 2023-01-11 ENCOUNTER — Ambulatory Visit: Payer: Self-pay

## 2023-01-12 NOTE — Patient Outreach (Signed)
  Care Coordination   Follow Up Visit Note   01/12/2023 Name: Todd Mendoza MRN: [id number] DOB: 10-May-1951  Todd Mendoza is a 72 y.o. year old male who sees Ngetich, Dinah C, NP for primary care. I spoke with  Todd Mendoza by phone today.  What matters to the patients health and wellness today?  Patient will consider asking his Orthopedic doctor about starting PT. He may consider participating the PREP program.     Goals Addressed             This Visit's Progress    To better manage left shoulder pain       Care Coordination Interventions: Reviewed provider established plan for pain management Determined patient completed a follow up with his Orthopedic doctor for evaluation of his shoulder pain Discussed and reviewed with patient he received a cortisone injection with good effectiveness immediately following the injection, however he states the pain has started to return  Counseled on the importance of reporting any/all new or changed pain symptoms or management strategies to pain management provider Advised patient to report to care team affect of pain on daily activities Reviewed with patient prescribed pharmacological and nonpharmacological pain relief strategies Advised patient to discuss PT with provider Resent printed educational materials to patient regarding the PREP program     Interventions Today    Flowsheet Row Most Recent Value  Chronic Disease   Chronic disease during today's visit Other  [chronic shoulder pain]  General Interventions   General Interventions Discussed/Reviewed General Interventions Discussed, General Interventions Reviewed, Doctor Visits  Doctor Visits Discussed/Reviewed Doctor Visits Discussed, Doctor Visits Reviewed, Specialist  Exercise Interventions   Exercise Discussed/Reviewed Physical Activity  Physical Activity Discussed/Reviewed Physical Activity Reviewed, Physical Activity Discussed, Types of exercise, Home Exercise Program (HEP)   Education Interventions   Education Provided Provided Education  Provided Verbal Education On When to see the doctor, Exercise, Medication          SDOH assessments and interventions completed:  No     Care Coordination Interventions:  Yes, provided   Follow up plan: Follow up call scheduled for 02/22/23 :00 PM     Encounter Outcome:  Pt. Visit Completed

## 2023-01-19 ENCOUNTER — Ambulatory Visit (INDEPENDENT_AMBULATORY_CARE_PROVIDER_SITE_OTHER): Payer: Medicare Other | Admitting: Family

## 2023-01-19 ENCOUNTER — Other Ambulatory Visit: Payer: Self-pay | Admitting: Internal Medicine

## 2023-01-19 ENCOUNTER — Encounter: Payer: Self-pay | Admitting: Family

## 2023-01-19 VITALS — BP 128/68 | HR 85 | Temp 98.0°F | Resp 16 | Ht 68.11 in | Wt 209.6 lb

## 2023-01-19 DIAGNOSIS — Z0000 Encounter for general adult medical examination without abnormal findings: Secondary | ICD-10-CM

## 2023-01-19 NOTE — Progress Notes (Signed)
Subjective:   Todd Mendoza is a 72 y.o. male who presents for Medicare Annual/Subsequent preventive examination.  Review of Systems     Cardiac Risk Factors include: advanced age (>101men, >58 women);hypertension;sedentary lifestyle;male gender     Objective:    Today's Vitals   01/19/23 1048 01/19/23 1128  BP: 128/68   Pulse: 85   Resp: 16   Temp: 98 F (36.7 C)   TempSrc: Temporal   SpO2: 94%   Weight: 209 lb 9.6 oz (95.1 kg)   Height: 5' 8.11" (1.73 m)   PainSc:  8    Body mass index is 31.77 kg/m.     01/19/2023   10:53 AM 10/26/2022    8:35 AM 10/24/2022   10:32 PM 10/24/2022    4:49 AM 10/11/2022   11:04 AM 10/05/2022    9:21 AM 07/07/2022    1:04 PM  Advanced Directives  Does Patient Have a Medical Advance Directive? Yes No No No No Yes No  Type of Estate agent of [place];Living will     Healthcare Power of [place];Living will   Does patient want to make changes to medical advance directive? No - Patient declined  No - Patient declined  No - Patient declined    Copy of Healthcare Power of Attorney in Chart? No - copy requested     No - copy requested   Would patient like information on creating a medical advance directive?   No - Patient declined    Yes (Inpatient - patient defers creating a medical advance directive at this time - Information given)    Current Medications (verified) Outpatient Encounter Medications as of 01/19/2023  Medication Sig   albuterol (VENTOLIN HFA) 108 (90 Base) MCG/ACT inhaler Inhale 2 puffs into the lungs every 6 (six) hours as needed for wheezing or shortness of breath.   amLODipine (NORVASC) 5 MG tablet TAKE 1 TABLET (5 MG TOTAL) BY MOUTH DAILY.   aspirin EC 81 MG tablet Take 1 tablet (81 mg total) by mouth daily.   atorvastatin (LIPITOR) 40 MG tablet Take 40 mg by mouth daily.   Budeson-Glycopyrrol-Formoterol (BREZTRI AEROSPHERE) 160-9-4.8 MCG/ACT AERO Inhale 2 puffs into the lungs in the morning and at  bedtime.   fenofibrate (TRICOR) 145 MG tablet TAKE 1 TABLET BY MOUTH EVERY DAY   finasteride (PROSCAR) 5 MG tablet TAKE 1 TABLET (5 MG TOTAL) BY MOUTH DAILY.   fluconazole (DIFLUCAN) 200 MG tablet Take 1 tablet (200 mg total) by mouth daily.   gabapentin (NEURONTIN) 300 MG capsule Take 300 mg by mouth 3 (three) times daily.   hydrochlorothiazide (HYDRODIURIL) 25 MG tablet TAKE 1 TABLET (25 MG TOTAL) BY MOUTH DAILY.   methocarbamol (ROBAXIN) 500 MG tablet Take 1 tablet (500 mg total) by mouth 4 (four) times daily.   Multiple Vitamins-Minerals (MULTIVITAMIN WITH MINERALS) tablet Take 1 tablet by mouth daily.   pantoprazole (PROTONIX) 40 MG tablet Take 1 tablet (40 mg total) by mouth 2 (two) times daily.   silodosin (RAPAFLO) 8 MG CAPS capsule Take 8 mg by mouth daily.   No facility-administered encounter medications on file as of 01/19/2023.    Allergies (verified) Patient has no known allergies.   History: 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   Hyperlipidemia  Hypertension    Stroke [facility])    Vertigo    Past Surgical History:  Procedure Laterality Date   BIOPSY  10/26/2022   Procedure: BIOPSY;  Surgeon: Beverley Fiedler, MD;  Location: [facility] ENDOSCOPY;  Service: Gastroenterology;;   COLONOSCOPY     ESOPHAGOGASTRODUODENOSCOPY (EGD) WITH PROPOFOL N/A 10/26/2022   Procedure: ESOPHAGOGASTRODUODENOSCOPY (EGD) WITH PROPOFOL;  Surgeon: Beverley Fiedler, MD;  Location: [facility] ENDOSCOPY;  Service: Gastroenterology;  Laterality: N/A;   LAMINECTOMY WITH POSTERIOR LATERAL ARTHRODESIS LEVEL 4 N/A 10/19/2022   Procedure: Lumbar one to Sacral one Laminectomies with Lumbar one to Sacral one Posterolateral instrumented fusion;  Surgeon: Jadene Pierini, MD;  Location: [facility] OR;  Service: Neurosurgery;  Laterality: N/A;   MULTIPLE TOOTH EXTRACTIONS     NO PAST SURGERIES      Family History  Problem Relation Age of Onset   Diabetes Father    Colon cancer Neg Hx    Colon polyps Neg Hx    Esophageal cancer Neg Hx    Stomach cancer Neg Hx    Rectal cancer Neg Hx    Social History   Socioeconomic History   Marital status: Married    Spouse name: Not on file   Number of children: Not on file   Years of education: Not on file   Highest education level: Not on file  Occupational History   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[employer]  Tobacco Use   Smoking status: Former    Packs/day: .2    Types: Cigarettes   Smokeless tobacco: Never  Vaping Use   Vaping Use: Never used  Substance and Sexual Activity   Alcohol use: No    Alcohol/week: 0.0 standard drinks of alcohol   Drug use: Not Currently    Types: Cocaine    Comment: 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  Other Topics Concern   Not on file  Social History Narrative   Married   Research officer, political party daily   Social Determinants of Health   Financial Resource Strain: Low Risk  (06/19/2021)   Overall Financial Resource Strain (CARDIA)    Difficulty of Paying Living Expenses: Not hard at all  Food Insecurity: No Food Insecurity (10/30/2022)   Hunger Vital Sign    Worried About Running Out of Food in the Last Year: Never true    Ran Out of Food in the Last Year: Never true  Transportation Needs: No Transportation Needs (12/14/2022)   PRAPARE - Administrator, Civil Service (Medical): No    Lack of Transportation (Non-Medical): No  Physical Activity: Sufficiently Active (06/19/2021)   Exercise Vital Sign    Days of Exercise per Week: 4 days    Minutes of Exercise per Session: 40 min  Stress: No Stress Concern Present (03/25/2020)   [employer] of Occupational Health - Occupational Stress Questionnaire    Feeling of Stress : Not at all  Social Connections: Moderately Integrated (06/19/2021)   Social Connection and Isolation Panel [NHANES]     Frequency of Communication with Friends and Family: Three times a week    Frequency of Social Gatherings with Friends and Family: Three times a week    Attends Religious Services: More than 4 times per year    Active Member of Clubs or Organizations: No    Attends Banker Meetings: Never    Marital Status: Married    Tobacco Counseling Counseling given: Not Answered  Clinical Intake:  Pre-visit preparation completed: No  Pain : 0-10 Pain Score: 8  Pain Type: Chronic pain Pain Location: Hip Pain Orientation: Left Pain Radiating Towards: No Pain Descriptors / Indicators: Sharp Pain Onset: More than a month ago Pain Frequency: Intermittent Pain Relieving Factors: Aleve Effect of Pain on Daily Activities: No  Pain Relieving Factors: Aleve  BMI - recorded: 31.77 Nutritional Status: BMI > 30  Obese Nutritional Risks: None Diabetes: No  How often do you need to have someone help you when you read instructions, pamphlets, or other written materials from your doctor or pharmacy?: 1 - Never What is the last grade level you completed in school?: GED  Diabetic?No   Interpreter Needed?: No    Activities of Daily Living    01/19/2023   10:53 AM 10/24/2022   10:32 PM  In your present state of health, do you have any difficulty performing the following activities:  Hearing? 0 0  Vision? 0 0  Difficulty concentrating or making decisions? 0 0  Walking or climbing stairs? 0 0  Dressing or bathing? 0 1  Doing errands, shopping? 0 0  Preparing Food and eating ? N   Using the Toilet? N   In the past six months, have you accidently leaked urine? N   Do you have problems with loss of bowel control? N   Managing your Medications? N   Managing your Finances? N   Housekeeping or managing your Housekeeping? N     Patient Care Team: [place] Canty, Donalee Citrin, NP as PCP - General (Family Medicine) Jadene Pierini, MD as Consulting Physician (Neurosurgery) Clarene Duke, Karma Lew,  RN as [facility] Care Management  Indicate any recent Medical Services you may have received from other than [facility] providers in the past year (date may be approximate).     Assessment:   This is a routine wellness examination for Emmanuell.  Hearing/Vision screen No results found.  Dietary issues and exercise activities discussed: Current Exercise Habits: The patient does not participate in regular exercise at present;Home exercise routine, Type of exercise: walking (rowing machine for past couple days), Time (Minutes): 30, Frequency (Times/Week): 5, Weekly Exercise (Minutes/Week): 150, Intensity: Mild, Exercise limited by: None identified   Goals Addressed               This Visit's Progress     Patient Stated   On track     I want to increase my physical activity by starting to go back to the [facility] twice weekly. Watch my diet for fat and salt. 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       Patient Stated (pt-stated)   Not on track     To  lose 25-30 pounds      To better manage left shoulder pain   On track     Care Coordination Interventions: Reviewed provider established plan for pain management Determined patient completed a follow up with his Orthopedic doctor for evaluation of his shoulder pain Discussed and reviewed with patient he received a cortisone injection with good effectiveness immediately following the injection, however he states the pain has started to return  Counseled on the importance of reporting any/all new or changed pain symptoms or management strategies to pain management provider Advised patient to report to care team affect of pain on daily activities Reviewed with patient prescribed pharmacological and nonpharmacological pain relief strategies Advised patient to discuss PT  with provider Resent printed educational materials to patient regarding the PREP program             Depression Screen    01/19/2023   10:53 AM 11/21/2022    9:45 AM 09/15/2021    9:21 AM 08/01/2021    2:34 PM 06/19/2021    2:08 PM 05/18/2021    9:00 AM 12/20/2020    8:14 AM  PHQ 2/9 Scores  PHQ - 2 Score 0 0 0 0 0 0 0    Fall Risk    01/19/2023   10:53 AM 10/11/2022   11:04 AM 08/14/2022    9:31 AM 09/15/2021    9:21 AM 08/01/2021    2:34 PM  Fall Risk   Falls in the past year? 0 0 0 0 0  Number falls in past yr: 0 0 0 0 0  Injury with Fall? 0 0 0 0 0  Risk for fall due to : History of fall(s);No Fall Risks No Fall Risks No Fall Risks    Follow up  Falls evaluation completed Falls evaluation completed      FALL RISK PREVENTION PERTAINING TO THE HOME:  Any stairs in or around the home? Yes  If so, are there any without handrails? No  Home free of loose throw rugs in walkways, pet beds, electrical cords, etc? No  Adequate lighting in your home to reduce risk of falls? Yes   ASSISTIVE DEVICES UTILIZED TO PREVENT FALLS:  Life alert? No  Use of a cane, walker or w/c? No  Grab bars in the bathroom? No  Shower chair or bench in shower? No  Elevated toilet seat or a handicapped toilet? No   TIMED UP AND GO:  Was the test performed? Yes .  Length of time to ambulate 10 feet: 8 sec.   Gait steady and fast without use of assistive device  Cognitive Function:    01/19/2023   10:58 AM 01/19/2023   10:54 AM  MMSE - Mini Mental State Exam  Orientation to time 5 5  Orientation to Place 5 5  Registration 3 3  Attention/ Calculation 5 5  Recall 1 1  Language- name 2 objects 2 2  Language- repeat 1 1  Language- follow 3 step command 3 3  Language- read & follow direction 1 1  Write a sentence 1 1  Copy design 0 1  Total score 27 28        Immunizations Immunization History  Administered Date(s) Administered   Fluad Quad(high Dose 65+) 06/24/2019, 06/21/2020   Influenza, High Dose Seasonal PF 09/17/2018   Influenza-Unspecified 06/01/2021, 08/10/2022    PFIZER(Purple Top)SARS-COV-2 Vaccination 11/24/2019, 12/23/2019   Pneumococcal Conjugate-13 04/11/2016   Pneumococcal Polysaccharide-23 05/15/2017   Tdap 04/02/2017   Zoster Recombinat (Shingrix) 10/18/2020, 12/20/2020    TDAP status: Up to date  Flu Vaccine status: Up to date  Pneumococcal vaccine status: Up to date  Covid-19 vaccine status: Information provided on how to obtain vaccines.   Qualifies for Shingles Vaccine? Yes   Zostavax completed No   Shingrix Completed?: Yes  Screening Tests Health Maintenance  Topic Date Due   COVID-19 Vaccine (3 - Pfizer risk series) 01/20/2020   INFLUENZA VACCINE  04/26/2023   Medicare Annual Wellness (AWV)  01/19/2024   COLONOSCOPY (Pts 45-22yrs Insurance coverage will need to be confirmed)  07/27/2024   DTaP/Tdap/Td (2 - Td or Tdap) 04/03/2027   Pneumonia Vaccine 71+ Years old  Completed   Hepatitis C Screening  Completed   Zoster Vaccines- Shingrix  Completed   HPV VACCINES  Aged Out    Health Maintenance  Health Maintenance Due  Topic Date Due   COVID-19 Vaccine (3 - Pfizer risk series) 01/20/2020    Colorectal cancer screening: Type of screening: Colonoscopy. Completed 07/27/2021. Repeat every 3 years  Lung Cancer Screening: (Low Dose CT Chest recommended if Age 73-80 years, 30 pack-year currently smoking OR have quit w/in 15years.) does qualify.   Lung Cancer Screening Referral: Yes CT done 08/09/2022   Additional Screening:  Hepatitis C Screening: does qualify; Completed Yes   Vision Screening: Recommended annual ophthalmology exams for early detection of glaucoma and other disorders of the eye. Is the patient up to date with their annual eye exam?  No  Who is the provider or what is the name of the office in which the patient attends annual eye exams? Dr.Groat  If pt is not established with a provider, would they like to be referred to a provider to establish care? No .   Dental Screening: Recommended annual dental  exams for proper oral hygiene  Community Resource Referral / Chronic Care Management: CRR required this visit?  No   CCM required this visit?  No      Plan:     I have personally reviewed and noted the following in the patient's chart:   Medical and social history Use of alcohol, tobacco or illicit drugs  Current medications and supplements including opioid prescriptions. Patient is not currently taking opioid prescriptions. Functional ability and status Nutritional status Physical activity Advanced directives List of other physicians Hospitalizations, surgeries, and ER visits in previous 12 months Vitals Screenings to include cognitive, depression, and falls Referrals and appointments  In addition, I have reviewed and discussed with patient certain preventive protocols, quality metrics, and best practice recommendations. A written personalized care plan for preventive services as well as general preventive health recommendations were provided to patient.     Caesar Bookman, NP   01/19/2023   Nurse Notes: Advised to get COVID-19 vaccine at Pharmacy.Also advised to schedule appointment with Dr.Groat

## 2023-01-23 ENCOUNTER — Other Ambulatory Visit: Payer: Self-pay

## 2023-01-23 ENCOUNTER — Other Ambulatory Visit: Payer: Self-pay | Admitting: Internal Medicine

## 2023-01-23 MED ORDER — PANTOPRAZOLE SODIUM 40 MG PO TBEC: 40.0000 mg | DELAYED_RELEASE_TABLET | Freq: Two times a day (BID) | ORAL | 1 refills | Status: DC

## 2023-01-23 MED ORDER — AMLODIPINE BESYLATE 5 MG PO TABS: 5.0000 mg | ORAL_TABLET | Freq: Every day | ORAL | 1 refills | Status: DC

## 2023-01-31 ENCOUNTER — Other Ambulatory Visit: Payer: Self-pay | Admitting: Nurse Practitioner

## 2023-01-31 DIAGNOSIS — J432 Centrilobular emphysema: Secondary | ICD-10-CM

## 2023-01-31 DIAGNOSIS — J4530 Mild persistent asthma, uncomplicated: Secondary | ICD-10-CM

## 2023-02-13 ENCOUNTER — Encounter: Payer: Self-pay | Admitting: Nurse Practitioner

## 2023-02-13 ENCOUNTER — Ambulatory Visit: Payer: Medicare Other | Admitting: Nurse Practitioner

## 2023-02-13 VITALS — BP 130/72 | HR 70 | Temp 97.7°F | Ht 70.0 in | Wt 208.6 lb

## 2023-02-13 DIAGNOSIS — J432 Centrilobular emphysema: Secondary | ICD-10-CM

## 2023-02-13 DIAGNOSIS — J209 Acute bronchitis, unspecified: Secondary | ICD-10-CM | POA: Insufficient documentation

## 2023-02-13 DIAGNOSIS — D509 Iron deficiency anemia, unspecified: Secondary | ICD-10-CM | POA: Diagnosis not present

## 2023-02-13 DIAGNOSIS — R0609 Other forms of dyspnea: Secondary | ICD-10-CM

## 2023-02-13 DIAGNOSIS — J4530 Mild persistent asthma, uncomplicated: Secondary | ICD-10-CM | POA: Diagnosis not present

## 2023-02-13 DIAGNOSIS — R918 Other nonspecific abnormal finding of lung field: Secondary | ICD-10-CM | POA: Diagnosis not present

## 2023-02-13 DIAGNOSIS — J302 Other seasonal allergic rhinitis: Secondary | ICD-10-CM | POA: Diagnosis not present

## 2023-02-13 DIAGNOSIS — J449 Chronic obstructive pulmonary disease, unspecified: Secondary | ICD-10-CM | POA: Insufficient documentation

## 2023-02-13 MED ORDER — LEVOCETIRIZINE DIHYDROCHLORIDE 5 MG PO TABS: 5.0000 mg | ORAL_TABLET | Freq: Every day | ORAL | 5 refills | Status: AC | PRN

## 2023-02-13 NOTE — Progress Notes (Signed)
@Patient  ID: Todd Mendoza, male    DOB: [birth year], 72 y.o.   MRN: [id number]  Chief Complaint  Patient presents with   Follow-up    Pt has concerns about inhalers.    Referring provider: Ngetich, Donalee Citrin, NP  HPI: 72 year old male, former smoker followed for asthma, emphysema, and pulmonary nodules. He is a patient of Dr. Kavin Leech and last seen in office 11/15/2022 by [facility] NP. Past medical history significant for HTN, allergic rhinitis, HLD.  TEST/EVENTS:  08/09/2022 LDCT chest: atherosclerosis. Mild centrilobular emphysema. Calcified and noncalcified nodules, largest measuring 3.1 mm. Lung RADS 2 10/03/2022 PFT: FVC 50, FEV1 53, ratio 85, TLC 86, DLCOcor 70. Positive bronchodilator response (21% change)  08/23/2022: OV with Dr. Delton Coombes for initial consult. Referred by his PC for suspected COPD. He also had LDCT for lung cancer screening with pulmonary nodular disease. Reports exertional SOB with walking, extended activity. No real cough. Does sometimes hear rattling in his chest. Able to do chores, work outside. Not on any BD. Emphysematous changes on imaging. PFTs ordered for further evaluation. Lung RADS 2 on imaging; plan for repeat in 1 year, November 2024.  10/04/2022: OV with Saliyah Gillin NP for follow-up after undergoing pulmonary function testing.  He had FVC of 50, FEV1 of 53, ratio of 85, TLC of 86 and DLCO 70.  He did have significant bronchodilator response with 21% improvement.  Today, he tells me that he is feeling relatively unchanged compared to when he was here last.  He has never been on any inhaler therapies.  He gets short winded with strenuous activity and long distance walking.  Really notices it with uphill/stair climbing.  He has an occasional cough, which is usually dry but sometimes will produce a small amount of clear mucus in the mornings.  Does notice an occasional wheeze.  No history of childhood asthma.  Occasionally has some allergy type symptoms in the fall and  spring.  11/15/2022: OV with Keitra Carusone NP for follow up after being started on Breo for suspected asthma/emphysema.  Since he was here last, he has had some trouble with anemia following a neurosurgery procedure.  He presented to the ED with worsening back pain, new hiccoughs, abd distention, and some bloody stool with mild fever. He had positive guaiac. He was hospitalized from 10/24/2022 to 10/27/2022 for this and treated with blood transfusion. He underwent endoscopy with GI; esophageal candidiasis. He improved and was discharged on fluconazole and PPI. He had follow up with his PCP on Monday 2/19 and hgb had improved to 9.1 from 7.4. He feels like he is recovering well. Denies any lightheadedness or dizziness.  Regarding his breathing, he started using the Breo daily and albuterol inhaler PRN.  He has not noticed that the [facility] helps with his shortness of breath much; however, he feels like the albuterol does a great job and helps open up his chest.  He has been using this twice daily to help with his baseline dyspnea.  Feels that it gives him a little more stamina.  He has not noticed any significant cough since he was here last.  Has not noticed any wheezing either.  02/13/2023: Today - follow up Patient presents today for 62-month follow-up.  He has been doing okay since he was here last.  Still has trouble with getting short winded with most activities other than walking on level ground at his own pace.  He does feel like the Mark[employer] Daft has helped him a little bit more  than the Breo did.  He also notices that he tends to get a little more short winded when he is outside doing something.  He is not sure if this is his allergies or not.  Has not noticed any significant cough.  Denies any wheezing, leg swelling, orthopnea, PND, palpitations, chest pain.  Still using his rescue 1-2 times a day.  No Known Allergies  Immunization History  Administered Date(s) Administered   Fluad Quad(high Dose 65+) 06/24/2019,  06/21/2020   Influenza, High Dose Seasonal PF 09/17/2018   Influenza-Unspecified 06/01/2021, 08/10/2022   PFIZER(Purple Top)SARS-COV-2 Vaccination 11/24/2019, 12/23/2019   Pneumococcal Conjugate-13 04/11/2016   Pneumococcal Polysaccharide-23 05/15/2017   Tdap 04/02/2017   Zoster Recombinat (Shingrix) 10/18/2020, 12/20/2020    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   Hyperlipidemia    Hypertension    Stroke (HCC)    Vertigo     Tobacco History: Social History   Tobacco Use  Smoking Status Former   Packs/day: .2   Types: Cigarettes  Smokeless Tobacco Never   Counseling given: Not Answered   Outpatient Medications Prior to Visit  Medication Sig Dispense Refill   albuterol (VENTOLIN HFA) 108 (90 Base) MCG/ACT inhaler TAKE 2 PUFFS BY MOUTH EVERY 6 HOURS AS NEEDED FOR WHEEZE OR SHORTNESS OF BREATH 8.5 each 2   amLODipine (NORVASC) 5 MG tablet Take 1 tablet (5 mg total) by mouth daily. 90 tablet 1   aspirin EC 81 MG tablet Take 1 tablet (81 mg total) by mouth daily. 30 tablet 0   atorvastatin (LIPITOR) 40 MG tablet Take 40 mg by mouth daily.     Budeson-Glycopyrrol-Formoterol (BREZTRI AEROSPHERE) 160-9-4.8 MCG/ACT AERO Inhale 2 puffs into the lungs in the morning and at bedtime. 10.7 g 6   fenofibrate (TRICOR) 145 MG tablet TAKE 1 TABLET BY MOUTH EVERY DAY 90 tablet 1   finasteride (PROSCAR) 5 MG tablet TAKE 1 TABLET (5 MG TOTAL) BY MOUTH DAILY. 90 tablet 1   fluconazole (DIFLUCAN) 200 MG tablet Take 1 tablet (200 mg total) by mouth daily. 14 tablet 0   gabapentin (NEURONTIN) 300 MG capsule Take 300 mg by mouth 3 (three) times daily.     methocarbamol (ROBAXIN) 500 MG tablet Take 1 tablet (500 mg total) by mouth 4 (four) times daily. 40 tablet 1   Multiple Vitamins-Minerals (MULTIVITAMIN WITH MINERALS) tablet Take 1 tablet by mouth daily. 30  tablet 5   pantoprazole (PROTONIX) 40 MG tablet Take 1 tablet (40 mg total) by mouth 2 (two) times daily. 180 tablet 1   silodosin (RAPAFLO) 8 MG CAPS capsule Take 8 mg by mouth daily.     hydrochlorothiazide (HYDRODIURIL) 25 MG tablet TAKE 1 TABLET (25 MG TOTAL) BY MOUTH DAILY. 90 tablet 3   No facility-administered medications prior to visit.     Review of Systems:   Constitutional: No weight loss or gain, night sweats, fevers, chills, fatigue, or lassitude. HEENT: No headaches, difficulty swallowing, tooth/dental problems, or sore throat. No sneezing, itching, ear ache. +occasional nasal congestion, post nasal drip CV:  No chest pain, orthopnea, PND, swelling in lower extremities, anasarca, dizziness, palpitations, syncope Resp:+shortness of breath with exertion.  No cough.  No wheeze.  No excess mucus or change in color of mucus. No hemoptysis.  No chest wall deformity  GI:  No heartburn, indigestion, abdominal pain, nausea, vomiting, diarrhea, change in bowel habits, loss of appetite, bloody stools.  GU: No dysuria, change in color of urine, urgency or frequency.   Skin: No rash, lesions, ulcerations MSK:  No joint pain or swelling.  Neuro: No dizziness or lightheadedness.  Psych: No depression or anxiety. Mood stable.     Physical Exam:  BP 130/72   Pulse 70   Temp 97.7 F (36.5 C) (Oral)   Ht 5\' 10"  (1.778 m)   Wt 208 lb 9.6 oz (94.6 kg)   SpO2 99%   BMI 29.93 kg/m   GEN: Pleasant, interactive, well-appearing; obese; in no acute distress HEENT:  Normocephalic and atraumatic. PERRLA. Sclera white. Nasal turbinates pink, moist and patent bilaterally. No rhinorrhea present. Oropharynx pink and moist, without exudate or edema. No lesions, ulcerations, or postnasal drip.  NECK:  Supple w/ fair ROM. No JVD present. Normal carotid impulses w/o bruits. Thyroid symmetrical with no goiter or nodules palpated. No lymphadenopathy.   CV: RRR, no m/r/g, no peripheral edema. Pulses  intact, +2 bilaterally. No cyanosis, pallor or clubbing. PULMONARY:  Unlabored, regular breathing. Clear bilaterally A&P w/o wheezes/rales/rhonchi. No accessory muscle use.  GI: BS present and normoactive. Soft, non-tender to palpation. No organomegaly or masses detected.  MSK: No erythema, warmth or tenderness. Cap refil <2 sec all extrem. No deformities or joint swelling noted.  Neuro: A/Ox3. No focal deficits noted.   Skin: Warm, no lesions or rashe Psych: Normal affect and behavior. Judgement and thought content appropriate.     Lab Results:  CBC    Component Value Date/Time   WBC 6.8 11/13/2022 0817   RBC 3.28 (L) 11/13/2022 0817   HGB 9.1 (L) 11/13/2022 0817   HGB 11.9 (L) 04/03/2022 1633   HCT 28.7 (L) 11/13/2022 0817   HCT 37.1 (L) 04/03/2022 1633   PLT 392 11/13/2022 0817   PLT 205 04/03/2022 1633   MCV 87.5 11/13/2022 0817   MCV 88 04/03/2022 1633   MCH 27.7 11/13/2022 0817   MCHC 31.7 (L) 11/13/2022 0817   RDW 12.9 11/13/2022 0817   RDW 12.5 04/03/2022 1633   LYMPHSABS 2,645 11/13/2022 0817   LYMPHSABS 2.6 05/18/2021 0957   MONOABS 0.6 02/13/2019 0816   EOSABS 231 11/13/2022 0817   EOSABS 0.4 05/18/2021 0957   BASOSABS 41 11/13/2022 0817   BASOSABS 0.0 05/18/2021 0957    BMET    Component Value Date/Time   NA 142 11/13/2022 0817   NA 145 (H) 04/03/2022 1633   K 4.5 11/13/2022 0817   CL 107 11/13/2022 0817   CO2 25 11/13/2022 0817   GLUCOSE 88 11/13/2022 0817   BUN 17 11/13/2022 0817   BUN 10 04/03/2022 1633   CREATININE 1.10 11/13/2022 0817   CALCIUM 9.5 11/13/2022 0817   GFRNONAA >60 10/27/2022 0631   GFRAA 88 11/08/2020 0925    BNP No results found for: "BNP"   Imaging:  No results found.       Latest Ref Rng & Units 10/03/2022    3:13 PM  PFT Results  FVC-Pre L 2.29   FVC-Predicted Pre % 50   FVC-Post L 2.53   FVC-Predicted Post % 55   Pre FEV1/FVC % % 77   Post FEV1/FCV % % 85   FEV1-Pre L 1.77   FEV1-Predicted Pre % 53    FEV1-Post L 2.15   DLCO uncorrected ml/min/mmHg 18.67   DLCO UNC% % 70   DLCO corrected ml/min/mmHg 18.67  DLCO COR %Predicted % 70   DLVA Predicted % 86   TLC L 6.29   TLC % Predicted % 86   RV % Predicted % 103     No results found for: "NITRICOXIDE"      Assessment & Plan:   DOE (dyspnea on exertion) Persistent DOE. He has had some improvement with inhaler therapy for management of his emphysema/asthma but continues to have moderate symptom burden. He does have a history of anemia so will recheck CBC today to ensure this is not a contributing factor. Echocardiogram to evaluate for cardiac component to his dyspnea. Encouraged him to work on graded exercises. Could consider referral to pulmonary rehab.   Patient Instructions  Continue Breztri 2 puffs Twice daily. Brush tongue and rinse mouth afterwards.  Continue Albuterol inhaler 2 puffs every 6 hours as needed for shortness of breath or wheezing. Notify if symptoms persist despite rescue inhaler/neb use.    Take an over the counter allergy pill such zyrtec, claritin, xyzal on days you're going to work outside   [employer] today Echocardiogram ordered to make sure your heart function is normal and not contributing to your shortness of breath    Follow up with Dr. Delton Coombes or Florentina Addison Kinzi Frediani,NP in 3 months. If symptoms do not improve or worsen, please contact office for sooner follow up or seek emergency care.    Mild persistent asthma He does not have any wheezing or recent exacerbations. See above. He will continue current regimen for management of asthma/emphysema. Action plan in place.   Centrilobular emphysema (HCC) See above  Allergic rhinitis Add on daily antihistamine for trigger prevention. Medication education provided  Pulmonary nodules Lung RADS 2. Next LDCT chest due November 2024.    I spent 35 minutes of dedicated to the care of this patient on the date of this encounter to include pre-visit review of records,  face-to-face time with the patient discussing conditions above, post visit ordering of testing, clinical documentation with the electronic health record, making appropriate referrals as documented, and communicating necessary findings to members of the patients care team.  Noemi Chapel, NP 02/13/2023  Pt aware and understands NP's role.

## 2023-02-13 NOTE — Assessment & Plan Note (Signed)
Persistent DOE. He has had some improvement with inhaler therapy for management of his emphysema/asthma but continues to have moderate symptom burden. He does have a history of anemia so will recheck CBC today to ensure this is not a contributing factor. Echocardiogram to evaluate for cardiac component to his dyspnea. Encouraged him to work on graded exercises. Could consider referral to pulmonary rehab.   Patient Instructions  Continue Breztri 2 puffs Twice daily. Brush tongue and rinse mouth afterwards.  Continue Albuterol inhaler 2 puffs every 6 hours as needed for shortness of breath or wheezing. Notify if symptoms persist despite rescue inhaler/neb use.    Take an over the counter allergy pill such zyrtec, claritin, xyzal on days you're going to work outside   [employer] today Echocardiogram ordered to make sure your heart function is normal and not contributing to your shortness of breath    Follow up with Dr. Delton Coombes or Florentina Addison Sharae Zappulla,NP in 3 months. If symptoms do not improve or worsen, please contact office for sooner follow up or seek emergency care.

## 2023-02-13 NOTE — Assessment & Plan Note (Signed)
He does not have any wheezing or recent exacerbations. See above. He will continue current regimen for management of asthma/emphysema. Action plan in place.

## 2023-02-13 NOTE — Patient Instructions (Addendum)
Continue Breztri 2 puffs Twice daily. Brush tongue and rinse mouth afterwards.  Continue Albuterol inhaler 2 puffs every 6 hours as needed for shortness of breath or wheezing. Notify if symptoms persist despite rescue inhaler/neb use.    Take an over the counter allergy pill such zyrtec, claritin, xyzal on days you're going to work outside   [employer] today Echocardiogram ordered to make sure your heart function is normal and not contributing to your shortness of breath    Follow up with Dr. Delton Coombes or Florentina Addison Asante Blanda,NP in 3 months. If symptoms do not improve or worsen, please contact office for sooner follow up or seek emergency care.

## 2023-02-13 NOTE — Assessment & Plan Note (Signed)
See above

## 2023-02-13 NOTE — Assessment & Plan Note (Signed)
Lung RADS 2. Next LDCT chest due November 2024.

## 2023-02-13 NOTE — Assessment & Plan Note (Signed)
Add on daily antihistamine for trigger prevention. Medication education provided

## 2023-02-22 ENCOUNTER — Ambulatory Visit: Payer: Self-pay

## 2023-02-22 NOTE — Patient Outreach (Signed)
  Care Coordination   Follow Up Visit Note   02/22/2023 Name: Todd Mendoza MRN: [id number] DOB: Sep 30, 1950  Todd Mendoza is a 72 y.o. year old male who sees Ngetich, Dinah C, NP for primary care. I spoke with  [name] by phone today.  What matters to the patients health and wellness today?  Patient would like to consider participation in the PREP program.     Goals Addressed             This Visit's Progress    COMPLETED: To better manage left shoulder pain       Care Coordination Interventions: Counseled on the importance of reporting any/all new or changed pain symptoms or management strategies to pain management provider Advised patient to report to care team affect of pain on daily activities Reviewed with patient prescribed pharmacological and nonpharmacological pain relief strategies Advised patient to discuss PT with provider Resent printed educational materials to patient regarding the PREP program      To improve persistent shortness of breath       Care Coordination Interventions: Evaluation of current treatment plan related to persistent shortness of breath  and patient's adherence to plan as established by provider Determined patient completed a recent Pulmonology follow up for evaluation of persistent shortness of breath  Review of patient status, including review of consultant's reports, relevant laboratory and other test results, and medications completed Resent printed PREP brochure for patient to review and consider to help establish a routine exercise regimen     Interventions Today    Flowsheet Row Most Recent Value  Chronic Disease   Chronic disease during today's visit Other  [chronic shoulder pain,  persistent Asthma]  General Interventions   General Interventions Discussed/Reviewed General Interventions Discussed, General Interventions Reviewed, Doctor Visits  Doctor Visits Discussed/Reviewed Doctor Visits Discussed, Doctor Visits Reviewed, PCP,  Specialist  Exercise Interventions   Exercise Discussed/Reviewed Exercise Reviewed, Exercise Discussed, Physical Activity  Physical Activity Discussed/Reviewed PREP, Physical Activity Reviewed, Physical Activity Discussed  Education Interventions   Education Provided Provided Education  Provided Verbal Education On Medication, Exercise, When to see the doctor  Pharmacy Interventions   Pharmacy Dicussed/Reviewed Pharmacy Topics Discussed, Pharmacy Topics Reviewed          SDOH assessments and interventions completed:  No     Care Coordination Interventions:  Yes, provided   Follow up plan: Follow up call scheduled for 03/08/23 @11 :00 AM    Encounter Outcome:  Pt. Visit Completed

## 2023-02-22 NOTE — Patient Instructions (Signed)
Visit Information  Thank you for taking time to visit with me today. Please don't hesitate to contact me if I can be of assistance to you.   Following are the goals we discussed today:   Goals Addressed             This Visit's Progress    COMPLETED: To better manage left shoulder pain       Care Coordination Interventions: Counseled on the importance of reporting any/all new or changed pain symptoms or management strategies to pain management provider Advised patient to report to care team affect of pain on daily activities Reviewed with patient prescribed pharmacological and nonpharmacological pain relief strategies Advised patient to discuss PT with provider Resent printed educational materials to patient regarding the PREP program          To improve persistent shortness of breath       Care Coordination Interventions: Evaluation of current treatment plan related to persistent shortness of breath  and patient's adherence to plan as established by provider Determined patient completed a recent Pulmonology follow up for evaluation of persistent shortness of breath  Review of patient status, including review of consultant's reports, relevant laboratory and other test results, and medications completed Resent printed PREP brochure for patient to review and consider to help establish a routine exercise regimen            Our next appointment is by telephone on 03/08/23 at 11:00 AM  Please call the care guide team at [phone] if you need to cancel or reschedule your appointment.   If you are experiencing a Mental Health or Behavioral Health Crisis or need someone to talk to, please call 1-800-273-TALK (toll free, 24 hour hotline)  Patient verbalizes understanding of instructions and care plan provided today and agrees to view in MyChart. Active MyChart status and patient understanding of how to access instructions and care plan via MyChart confirmed with patient.      Delsa Sale, RN, BSN, CCM Care Management Coordinator [facility] Care Management  Direct Phone: [phone]

## 2023-03-08 ENCOUNTER — Ambulatory Visit: Payer: Self-pay

## 2023-03-08 NOTE — Patient Outreach (Signed)
  Care Coordination   03/08/2023 Name: Todd Mendoza MRN: [id number] DOB: [birth year]   Care Coordination Outreach Attempts:  An unsuccessful telephone outreach was attempted for a scheduled appointment today.  Follow Up Plan:  Additional outreach attempts will be made to offer the patient care coordination information and services.   Encounter Outcome:  No Answer   Care Coordination Interventions:  No, not indicated    Delsa Sale, RN, BSN, CCM Care Management Coordinator [facility] Care Management Direct Phone: [phone]

## 2023-03-19 DIAGNOSIS — R3912 Poor urinary stream: Secondary | ICD-10-CM | POA: Diagnosis not present

## 2023-03-19 DIAGNOSIS — R338 Other retention of urine: Secondary | ICD-10-CM | POA: Diagnosis not present

## 2023-03-23 ENCOUNTER — Ambulatory Visit (HOSPITAL_COMMUNITY): Payer: Medicare Other

## 2023-04-06 ENCOUNTER — Ambulatory Visit: Payer: Self-pay

## 2023-04-06 NOTE — Patient Outreach (Signed)
  Care Coordination   04/06/2023 Name: Todd Mendoza MRN: [id number] DOB: [birth year]   Care Coordination Outreach Attempts:  An unsuccessful telephone outreach was attempted for a scheduled appointment today.  Follow Up Plan:  Additional outreach attempts will be made to offer the patient care coordination information and services.   Encounter Outcome:  No Answer   Care Coordination Interventions:  No, not indicated    Delsa Sale, RN, BSN, CCM Care Management Coordinator [facility] Care Management Direct Phone: [phone]

## 2023-04-12 ENCOUNTER — Ambulatory Visit (HOSPITAL_COMMUNITY): Payer: Medicare Other | Attending: Nurse Practitioner

## 2023-04-12 DIAGNOSIS — R0609 Other forms of dyspnea: Secondary | ICD-10-CM | POA: Diagnosis not present

## 2023-04-12 LAB — ECHOCARDIOGRAM COMPLETE
Area-P 1/2: 2.73 cm2
S' Lateral: 3.1 cm

## 2023-04-16 ENCOUNTER — Telehealth: Payer: Self-pay | Admitting: *Deleted

## 2023-04-16 NOTE — Progress Notes (Signed)
  Care Coordination Note  04/16/2023 Name: Todd Mendoza MRN: [id number] DOB: Feb 02, 1951  Todd Mendoza is a 72 y.o. year old male who is a primary care patient of Ngetich, Dinah C, NP and is actively engaged with the care management team. I reached out to [name] by phone today to assist with re-scheduling a follow up visit with the RN Case Manager  Follow up plan: Unsuccessful telephone outreach attempt made. A HIPAA compliant phone message was left for the patient providing contact information and requesting a return call.   [facility]  Care Coordination Care Guide  Direct Dial: [phone]

## 2023-04-20 NOTE — Progress Notes (Signed)
  Care Coordination Note  04/20/2023 Name: Romere Gardy MRN: [id number] DOB: 1951/07/24  Bracken Palleschi is a 72 y.o. year old male who is a primary care patient of Ngetich, Dinah C, NP and is actively engaged with the care management team. I reached out to [name] by phone today to assist with re-scheduling a follow up visit with the RN Case Manager  Follow up plan: Telephone appointment with care management team member scheduled for:05/04/23  [facility] Coordination Care Guide  Direct Dial: [phone]

## 2023-05-08 ENCOUNTER — Other Ambulatory Visit: Payer: Self-pay | Admitting: Nurse Practitioner

## 2023-05-08 DIAGNOSIS — J432 Centrilobular emphysema: Secondary | ICD-10-CM

## 2023-05-08 DIAGNOSIS — J4530 Mild persistent asthma, uncomplicated: Secondary | ICD-10-CM

## 2023-05-16 ENCOUNTER — Ambulatory Visit: Payer: Medicare Other | Admitting: Nurse Practitioner

## 2023-05-16 ENCOUNTER — Encounter: Payer: Self-pay | Admitting: Nurse Practitioner

## 2023-05-16 VITALS — BP 134/70 | HR 70 | Ht 71.0 in | Wt 215.2 lb

## 2023-05-16 DIAGNOSIS — R918 Other nonspecific abnormal finding of lung field: Secondary | ICD-10-CM

## 2023-05-16 DIAGNOSIS — J4530 Mild persistent asthma, uncomplicated: Secondary | ICD-10-CM | POA: Diagnosis not present

## 2023-05-16 DIAGNOSIS — J432 Centrilobular emphysema: Secondary | ICD-10-CM

## 2023-05-16 DIAGNOSIS — J309 Allergic rhinitis, unspecified: Secondary | ICD-10-CM

## 2023-05-16 MED ORDER — BREZTRI AEROSPHERE 160-9-4.8 MCG/ACT IN AERO
2.0000 | INHALATION_SPRAY | Freq: Two times a day (BID) | RESPIRATORY_TRACT | 12 refills | Status: DC
Start: 2023-05-16 — End: 2023-08-10

## 2023-05-16 NOTE — Assessment & Plan Note (Signed)
No recent exacerbations.  See above plan.

## 2023-05-16 NOTE — Patient Instructions (Addendum)
Continue Breztri 2 puffs Twice daily. Brush tongue and rinse mouth afterwards.  Continue Albuterol inhaler 2 puffs every 6 hours as needed for shortness of breath or wheezing. Notify if symptoms persist despite rescue inhaler/neb use.  Continue xyzal 1 tab daily for allergies   Lung cancer screening program CT chest in November 2024    Follow up with Dr. Delton Coombes or Florentina Addison Jerauld Bostwick,NP in 6 months. If symptoms do not improve or worsen, please contact office for sooner follow up or seek emergency care.

## 2023-05-16 NOTE — Assessment & Plan Note (Signed)
Followed by lung cancer screening program.  Next LDCT chest November 2024.

## 2023-05-16 NOTE — Assessment & Plan Note (Signed)
Moderate obstructive disease with reversibility.  Compensated on current regimen with Breztri.  Does feel like he gets the most benefit from use of this.  Encouraged to work on graded exercises.  Action plan in place.  Breztri Rx was resent to his pharmacy.  Unclear why the Virgel Bouquet was refilled as I do not see an active prescription for this.  Patient Instructions  Continue Breztri 2 puffs Twice daily. Brush tongue and rinse mouth afterwards.  Continue Albuterol inhaler 2 puffs every 6 hours as needed for shortness of breath or wheezing. Notify if symptoms persist despite rescue inhaler/neb use.  Continue xyzal 1 tab daily for allergies   Lung cancer screening program CT chest in November 2024    Follow up with Dr. Delton Coombes or Florentina Addison Christobal Morado,NP in 6 months. If symptoms do not improve or worsen, please contact office for sooner follow up or seek emergency care.

## 2023-05-16 NOTE — Assessment & Plan Note (Signed)
Compensated on current regimen

## 2023-05-16 NOTE — Progress Notes (Signed)
@Patient  ID: Todd Mendoza, male    DOB: [birth year], 72 y.o.   MRN: [id number]  Chief Complaint  Patient presents with   Follow-up    Pt is here for F/U visit today. Pt complains of SOB and Dry Cough. Pt want to discuss Inhalers.    Referring provider: Ngetich, Donalee Citrin, NP  HPI: 72 year old male, former smoker followed for asthma, emphysema, and pulmonary nodules. He is a patient of Dr. Kavin Leech and last seen in office 02/13/2023 by [facility] NP. Past medical history significant for HTN, allergic rhinitis, HLD.  TEST/EVENTS:  08/09/2022 LDCT chest: atherosclerosis. Mild centrilobular emphysema. Calcified and noncalcified nodules, largest measuring 3.1 mm. Lung RADS 2 10/03/2022 PFT: FVC 50, FEV1 53, ratio 85, TLC 86, DLCOcor 70. Positive bronchodilator response (21% change) 04/12/2023 echo: EF 60 to 65%.  RV size and function normal.  Mild MR.  Borderline dilatation of ascending aorta.  08/23/2022: OV with Dr. Delton Coombes for initial consult. Referred by his PC for suspected COPD. He also had LDCT for lung cancer screening with pulmonary nodular disease. Reports exertional SOB with walking, extended activity. No real cough. Does sometimes hear rattling in his chest. Able to do chores, work outside. Not on any BD. Emphysematous changes on imaging. PFTs ordered for further evaluation. Lung RADS 2 on imaging; plan for repeat in 1 year, November 2024.  10/04/2022: OV with Jasmon Mattice NP for follow-up after undergoing pulmonary function testing.  He had FVC of 50, FEV1 of 53, ratio of 85, TLC of 86 and DLCO 70.  He did have significant bronchodilator response with 21% improvement.  Today, he tells me that he is feeling relatively unchanged compared to when he was here last.  He has never been on any inhaler therapies.  He gets short winded with strenuous activity and long distance walking.  Really notices it with uphill/stair climbing.  He has an occasional cough, which is usually dry but sometimes will produce a small  amount of clear mucus in the mornings.  Does notice an occasional wheeze.  No history of childhood asthma.  Occasionally has some allergy type symptoms in the fall and spring.  11/15/2022: OV with Trashaun Streight NP for follow up after being started on Breo for suspected asthma/emphysema.  Since he was here last, he has had some trouble with anemia following a neurosurgery procedure.  He presented to the ED with worsening back pain, new hiccoughs, abd distention, and some bloody stool with mild fever. He had positive guaiac. He was hospitalized from 10/24/2022 to 10/27/2022 for this and treated with blood transfusion. He underwent endoscopy with GI; esophageal candidiasis. He improved and was discharged on fluconazole and PPI. He had follow up with his PCP on Monday 2/19 and hgb had improved to 9.1 from 7.4. He feels like he is recovering well. Denies any lightheadedness or dizziness.  Regarding his breathing, he started using the Breo daily and albuterol inhaler PRN.  He has not noticed that the [facility] helps with his shortness of breath much; however, he feels like the albuterol does a great job and helps open up his chest.  He has been using this twice daily to help with his baseline dyspnea.  Feels that it gives him a little more stamina.  He has not noticed any significant cough since he was here last.  Has not noticed any wheezing either.  02/13/2023: OV with Daren Doswell NP for 63-month follow-up.  He has been doing okay since he was here last.  Still has  trouble with getting short winded with most activities other than walking on level ground at his own pace.  He does feel like the Markus Daft has helped him a little bit more than the Breo did.  He also notices that he tends to get a little more short winded when he is outside doing something.  He is not sure if this is his allergies or not.  Has not noticed any significant cough.  Denies any wheezing, leg swelling, orthopnea, PND, palpitations, chest pain.  Still using his rescue 1-2  times a day.  05/16/2023: Today - follow up Patient presents today for follow-up.  Echo was normal after his last visit without any evidence of heart failure, pulmonary hypertension or significant valvular disease that would be contributing to his dyspnea.  He feels about the same.  He does feel that the Markus Daft helps him a lot.  Prefers to use this over anything else.  He has tried Production manager without much success.  For some reason, the pharmacy had refilled his Virgel Bouquet this last time instead of giving him the Vona.  No increased cough, chest congestion, wheezing, leg swelling.  Using rescue a few times a week but no longer having to use it daily.  No Known Allergies  Immunization History  Administered Date(s) Administered   Fluad Quad(high Dose 65+) 06/24/2019, 06/21/2020   Influenza, High Dose Seasonal PF 09/17/2018   Influenza-Unspecified 06/01/2021, 08/10/2022   PFIZER(Purple Top)SARS-COV-2 Vaccination 11/24/2019, 12/23/2019   Pneumococcal Conjugate-13 04/11/2016   Pneumococcal Polysaccharide-23 05/15/2017   Tdap 04/02/2017   Zoster Recombinant(Shingrix) 10/18/2020, 12/20/2020    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   Hyperlipidemia    Hypertension    Stroke (HCC)    Vertigo     Tobacco History: Social History   Tobacco Use  Smoking Status Former   Current packs/day: 0.20   Types: Cigarettes  Smokeless Tobacco Never   Counseling given: Not Answered   Outpatient Medications Prior to Visit  Medication Sig Dispense Refill   albuterol (VENTOLIN HFA) 108 (90 Base) MCG/ACT inhaler TAKE 2 PUFFS BY MOUTH EVERY 6 HOURS AS NEEDED FOR WHEEZE OR SHORTNESS OF BREATH 8.5 each 2   amLODipine (NORVASC) 5 MG tablet Take 1 tablet (5 mg total) by mouth daily. 90 tablet 1   aspirin EC 81 MG tablet Take 1 tablet (81 mg total) by mouth daily. 30  tablet 0   atorvastatin (LIPITOR) 40 MG tablet Take 40 mg by mouth daily.     fenofibrate (TRICOR) 145 MG tablet TAKE 1 TABLET BY MOUTH EVERY DAY 90 tablet 1   finasteride (PROSCAR) 5 MG tablet TAKE 1 TABLET (5 MG TOTAL) BY MOUTH DAILY. 90 tablet 1   gabapentin (NEURONTIN) 300 MG capsule Take 300 mg by mouth 3 (three) times daily.     levocetirizine (XYZAL) 5 MG tablet Take 1 tablet (5 mg total) by mouth daily as needed for allergies. 30 tablet 5   methocarbamol (ROBAXIN) 500 MG tablet Take 1 tablet (500 mg total) by mouth 4 (four) times daily. 40 tablet 1   Multiple Vitamins-Minerals (MULTIVITAMIN WITH MINERALS) tablet Take 1 tablet by mouth daily. 30 tablet 5   pantoprazole (PROTONIX) 40 MG tablet Take 1 tablet (40 mg total) by mouth 2 (two) times daily. 180 tablet  1   silodosin (RAPAFLO) 8 MG CAPS capsule Take 8 mg by mouth daily.     Budeson-Glycopyrrol-Formoterol (BREZTRI AEROSPHERE) 160-9-4.8 MCG/ACT AERO Inhale 2 puffs into the lungs in the morning and at bedtime. 10.7 g 6   hydrochlorothiazide (HYDRODIURIL) 25 MG tablet TAKE 1 TABLET (25 MG TOTAL) BY MOUTH DAILY. 90 tablet 3   fluconazole (DIFLUCAN) 200 MG tablet Take 1 tablet (200 mg total) by mouth daily. 14 tablet 0   No facility-administered medications prior to visit.     Review of Systems:   Constitutional: No weight loss or gain, night sweats, fevers, chills, fatigue, or lassitude. HEENT: No headaches, difficulty swallowing, tooth/dental problems, or sore throat. No sneezing, itching, ear ache, nasal congestion, post nasal drip CV:  No chest pain, orthopnea, PND, swelling in lower extremities, anasarca, dizziness, palpitations, syncope Resp:+shortness of breath with exertion.  No cough.  No wheeze.  No excess mucus or change in color of mucus. No hemoptysis.  No chest wall deformity GI:  No heartburn, indigestion GU: No dysuria, change in color of urine, urgency or frequency.   Skin: No rash, lesions, ulcerations MSK:  No  joint pain or swelling.  Neuro: No dizziness or lightheadedness.  Psych: No depression or anxiety. Mood stable.     Physical Exam:  BP 134/70 (BP Location: Right Arm, Cuff Size: Normal)   Pulse 70   Ht 5\' 11"  (1.803 m)   Wt 215 lb 3.2 oz (97.6 kg)   SpO2 99%   BMI 30.01 kg/m   GEN: Pleasant, interactive, well-appearing; obese; in no acute distress HEENT:  Normocephalic and atraumatic. PERRLA. Sclera white. Nasal turbinates pink, moist and patent bilaterally. No rhinorrhea present. Oropharynx pink and moist, without exudate or edema. No lesions, ulcerations, or postnasal drip.  NECK:  Supple w/ fair ROM. No JVD present. Normal carotid impulses w/o bruits. Thyroid symmetrical with no goiter or nodules palpated. No lymphadenopathy.   CV: RRR, no m/r/g, no peripheral edema. Pulses intact, +2 bilaterally. No cyanosis, pallor or clubbing. PULMONARY:  Unlabored, regular breathing. Clear bilaterally A&P w/o wheezes/rales/rhonchi. No accessory muscle use.  GI: BS present and normoactive. Soft, non-tender to palpation. No organomegaly or masses detected.  MSK: No erythema, warmth or tenderness. Cap refil <2 sec all extrem. No deformities or joint swelling noted.  Neuro: A/Ox3. No focal deficits noted.   Skin: Warm, no lesions or rashe Psych: Normal affect and behavior. Judgement and thought content appropriate.     Lab Results:  CBC    Component Value Date/Time   WBC 6.8 11/13/2022 0817   RBC 3.28 (L) 11/13/2022 0817   HGB 9.1 (L) 11/13/2022 0817   HGB 11.9 (L) 04/03/2022 1633   HCT 28.7 (L) 11/13/2022 0817   HCT 37.1 (L) 04/03/2022 1633   PLT 392 11/13/2022 0817   PLT 205 04/03/2022 1633   MCV 87.5 11/13/2022 0817   MCV 88 04/03/2022 1633   MCH 27.7 11/13/2022 0817   MCHC 31.7 (L) 11/13/2022 0817   RDW 12.9 11/13/2022 0817   RDW 12.5 04/03/2022 1633   LYMPHSABS 2,645 11/13/2022 0817   LYMPHSABS 2.6 05/18/2021 0957   MONOABS 0.6 02/13/2019 0816   EOSABS 231 11/13/2022 0817    EOSABS 0.4 05/18/2021 0957   BASOSABS 41 11/13/2022 0817   BASOSABS 0.0 05/18/2021 0957    BMET    Component Value Date/Time   NA 142 11/13/2022 0817   NA 145 (H) 04/03/2022 1633   K 4.5 11/13/2022 0817   CL 107  11/13/2022 0817   CO2 25 11/13/2022 0817   GLUCOSE 88 11/13/2022 0817   BUN 17 11/13/2022 0817   BUN 10 04/03/2022 1633   CREATININE 1.10 11/13/2022 0817   CALCIUM 9.5 11/13/2022 0817   GFRNONAA >60 10/27/2022 0631   GFRAA 88 11/08/2020 0925    BNP No results found for: "BNP"   Imaging:  No results found.  Administration History     None          Latest Ref Rng & Units 10/03/2022    3:13 PM  PFT Results  FVC-Pre L 2.29   FVC-Predicted Pre % 50   FVC-Post L 2.53   FVC-Predicted Post % 55   Pre FEV1/FVC % % 77   Post FEV1/FCV % % 85   FEV1-Pre L 1.77   FEV1-Predicted Pre % 53   FEV1-Post L 2.15   DLCO uncorrected ml/min/mmHg 18.67   DLCO UNC% % 70   DLCO corrected ml/min/mmHg 18.67   DLCO COR %Predicted % 70   DLVA Predicted % 86   TLC L 6.29   TLC % Predicted % 86   RV % Predicted % 103     No results found for: "NITRICOXIDE"      Assessment & Plan:   Centrilobular emphysema (HCC) Moderate obstructive disease with reversibility.  Compensated on current regimen with Breztri.  Does feel like he gets the most benefit from use of this.  Encouraged to work on graded exercises.  Action plan in place.  Breztri Rx was resent to his pharmacy.  Unclear why the Virgel Bouquet was refilled as I do not see an active prescription for this.  Patient Instructions  Continue Breztri 2 puffs Twice daily. Brush tongue and rinse mouth afterwards.  Continue Albuterol inhaler 2 puffs every 6 hours as needed for shortness of breath or wheezing. Notify if symptoms persist despite rescue inhaler/neb use.  Continue xyzal 1 tab daily for allergies   Lung cancer screening program CT chest in November 2024    Follow up with Dr. Delton Coombes or Florentina Addison Birdena Kingma,NP in 6 months. If  symptoms do not improve or worsen, please contact office for sooner follow up or seek emergency care.    Mild persistent asthma No recent exacerbations.  See above plan.  Pulmonary nodules Followed by lung cancer screening program.  Next LDCT chest November 2024.  Allergic rhinitis Compensated on current regimen.    I spent 25 minutes of dedicated to the care of this patient on the date of this encounter to include pre-visit review of records, face-to-face time with the patient discussing conditions above, post visit ordering of testing, clinical documentation with the electronic health record, making appropriate referrals as documented, and communicating necessary findings to members of the patients care team.  Noemi Chapel, NP 05/16/2023  Pt aware and understands NP's role.

## 2023-05-22 ENCOUNTER — Encounter: Payer: Self-pay | Admitting: Family

## 2023-05-22 ENCOUNTER — Ambulatory Visit (INDEPENDENT_AMBULATORY_CARE_PROVIDER_SITE_OTHER): Payer: Medicare Other | Admitting: Family

## 2023-05-22 VITALS — BP 142/80 | HR 76 | Temp 97.9°F | Resp 18 | Ht 71.0 in | Wt 210.0 lb

## 2023-05-22 DIAGNOSIS — I700 Atherosclerosis of aorta: Secondary | ICD-10-CM | POA: Diagnosis not present

## 2023-05-22 DIAGNOSIS — Z23 Encounter for immunization: Secondary | ICD-10-CM | POA: Diagnosis not present

## 2023-05-22 DIAGNOSIS — R202 Paresthesia of skin: Secondary | ICD-10-CM

## 2023-05-22 DIAGNOSIS — M5440 Lumbago with sciatica, unspecified side: Secondary | ICD-10-CM

## 2023-05-22 DIAGNOSIS — D649 Anemia, unspecified: Secondary | ICD-10-CM

## 2023-05-22 DIAGNOSIS — I10 Essential (primary) hypertension: Secondary | ICD-10-CM | POA: Diagnosis not present

## 2023-05-22 DIAGNOSIS — G8929 Other chronic pain: Secondary | ICD-10-CM | POA: Diagnosis not present

## 2023-05-22 DIAGNOSIS — R200 Anesthesia of skin: Secondary | ICD-10-CM

## 2023-05-22 DIAGNOSIS — I2510 Atherosclerotic heart disease of native coronary artery without angina pectoris: Secondary | ICD-10-CM

## 2023-05-22 DIAGNOSIS — J439 Emphysema, unspecified: Secondary | ICD-10-CM | POA: Diagnosis not present

## 2023-05-22 DIAGNOSIS — E782 Mixed hyperlipidemia: Secondary | ICD-10-CM

## 2023-05-22 MED ORDER — HYDROCHLOROTHIAZIDE 25 MG PO TABS: 25.0000 mg | ORAL_TABLET | Freq: Every day | ORAL | 3 refills | Status: AC

## 2023-05-22 MED ORDER — GABAPENTIN 300 MG PO CAPS
300.0000 mg | ORAL_CAPSULE | Freq: Three times a day (TID) | ORAL | 1 refills | Status: AC
Start: 2023-05-22 — End: ?

## 2023-05-22 MED ORDER — AMLODIPINE BESYLATE 10 MG PO TABS
10.0000 mg | ORAL_TABLET | Freq: Every day | ORAL | 1 refills | Status: DC
Start: 2023-05-22 — End: 2023-12-24

## 2023-05-22 NOTE — Progress Notes (Signed)
Provider: Richarda Blade FNP-C   Micaiah Litle, Donalee Citrin, NP  Patient Care Team: Shalynn Jorstad, Donalee Citrin, NP as PCP - General (Family Medicine) Jadene Pierini, MD as Consulting Physician (Neurosurgery) Clarene Duke Karma Lew, RN as [facility] Care Management  Extended Emergency Contact Information Primary Emergency Contact: [place] Address: [address]          [place], [place] [address] [place] Mobile Phone: [phone] Relation: Spouse  Code Status:  Full Code  Goals of care: Advanced Directive information    05/22/2023    8:09 AM  Advanced Directives  Does Patient Have a Medical Advance Directive? No  Would patient like information on creating a medical advance directive? No - Patient declined     Chief Complaint  Patient presents with   Follow-up    6 mths    Quality Metric Gaps    Needs Covid and Influenza vaccine.    HPI:  Pt is a 72 y.o. male seen today for 6 months medical management of chronic diseases.    Hypertension - states B/p runs in the 140's.He gets headache every now and then.Drinks 2-3 glasses per day.denies any dizziness,vision changes,fatigue,chest tightness,palpitation,chest pain or shortness of breath.     Left shoulder - seen by Orthopedic last seen 12/13/2022 cortisol injection given.states has been using OTC topical analgesic which has helped with the pain.He denies any numbness ,tingling and weakness.   Chronic back pain - hurts when he does a little extra yard work but has improved.topical cream effective.Has numbness and tingling on the feet.Not sure if taking Gabapentin.   Emphysema - inhalers effective follows up with Pulmonologist.   Hyperlipidemia - does not eat out.Tries to eat at home does eat enough veggies.His previous cholesterol,TRG and LDL were within normal range except HDL was low.discussed exercise that increase heart rate at least three times per week for 30 minutes such as brisk walking and others.    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   Hyperlipidemia    Hypertension    Stroke [facility])    Vertigo    Past Surgical History:  Procedure Laterality Date   BIOPSY  10/26/2022   Procedure: BIOPSY;  Surgeon: Beverley Fiedler, MD;  Location: [facility] ENDOSCOPY;  Service: Gastroenterology;;   COLONOSCOPY     ESOPHAGOGASTRODUODENOSCOPY (EGD) WITH PROPOFOL N/A 10/26/2022   Procedure: ESOPHAGOGASTRODUODENOSCOPY (EGD) WITH PROPOFOL;  Surgeon: Beverley Fiedler, MD;  Location: [facility] ENDOSCOPY;  Service: Gastroenterology;  Laterality: N/A;   LAMINECTOMY WITH POSTERIOR LATERAL ARTHRODESIS LEVEL 4 N/A 10/19/2022   Procedure: Lumbar one to Sacral one Laminectomies with Lumbar one to Sacral one Posterolateral instrumented fusion;  Surgeon: Jadene Pierini, MD;  Location: [facility] OR;  Service: Neurosurgery;  Laterality: N/A;   MULTIPLE TOOTH EXTRACTIONS     NO PAST SURGERIES      No Known Allergies  Allergies as of 05/22/2023   No Known Allergies      Medication List        Accurate as of May 22, 2023  9:50 AM. If you have any questions, ask your nurse or doctor.          albuterol 108 (90 Base) MCG/ACT inhaler Commonly known as: VENTOLIN HFA TAKE 2 PUFFS BY MOUTH EVERY 6 HOURS AS NEEDED FOR WHEEZE  OR SHORTNESS OF BREATH   amLODipine 5 MG tablet Commonly known as: NORVASC Take 1 tablet (5 mg total) by mouth daily.   aspirin EC 81 MG tablet Take 1 tablet (81 mg total) by mouth daily.   atorvastatin 40 MG tablet Commonly known as: LIPITOR Take 40 mg by mouth daily.   Breztri Aerosphere 160-9-4.8 MCG/ACT Aero Generic drug: Budeson-Glycopyrrol-Formoterol Inhale 2 puffs into the lungs in the morning and at bedtime.   fenofibrate 145 MG tablet Commonly known as: TRICOR TAKE 1 TABLET BY MOUTH EVERY DAY   finasteride 5 MG tablet Commonly known as: PROSCAR TAKE  1 TABLET (5 MG TOTAL) BY MOUTH DAILY.   gabapentin 300 MG capsule Commonly known as: NEURONTIN Take 300 mg by mouth 3 (three) times daily.   hydrochlorothiazide 25 MG tablet Commonly known as: HYDRODIURIL TAKE 1 TABLET (25 MG TOTAL) BY MOUTH DAILY.   levocetirizine 5 MG tablet Commonly known as: XYZAL Take 1 tablet (5 mg total) by mouth daily as needed for allergies.   methocarbamol 500 MG tablet Commonly known as: ROBAXIN Take 1 tablet (500 mg total) by mouth 4 (four) times daily.   multivitamin with minerals tablet Take 1 tablet by mouth daily.   pantoprazole 40 MG tablet Commonly known as: PROTONIX Take 1 tablet (40 mg total) by mouth 2 (two) times daily.   silodosin 8 MG Caps capsule Commonly known as: RAPAFLO Take 8 mg by mouth daily.        Review of Systems  Constitutional:  Negative for appetite change, chills, fatigue, fever and unexpected weight change.  HENT:  Negative for congestion, dental problem, ear discharge, ear pain, facial swelling, hearing loss, nosebleeds, postnasal drip, rhinorrhea, sinus pressure, sinus pain, sneezing, sore throat, tinnitus and trouble swallowing.   Eyes:  Negative for pain, discharge, redness, itching and visual disturbance.  Respiratory:  Negative for cough, chest tightness, shortness of breath and wheezing.   Cardiovascular:  Negative for chest pain, palpitations and leg swelling.  Gastrointestinal:  Negative for abdominal distention, abdominal pain, blood in stool, constipation, diarrhea, nausea and vomiting.  Endocrine: Negative for cold intolerance, heat intolerance, polydipsia, polyphagia and polyuria.  Genitourinary:  Negative for difficulty urinating, dysuria, flank pain, frequency and urgency.  Musculoskeletal:  Negative for arthralgias, back pain, gait problem, joint swelling, myalgias, neck pain and neck stiffness.  Skin:  Negative for color change, pallor, rash and wound.  Neurological:  Negative for dizziness, syncope,  speech difficulty, weakness, light-headedness, numbness and headaches.  Hematological:  Does not bruise/bleed easily.  Psychiatric/Behavioral:  Negative for agitation, behavioral problems, confusion, hallucinations, self-injury, sleep disturbance and suicidal ideas. The patient is not nervous/anxious.     Immunization History  Administered Date(s) Administered   Fluad Quad(high Dose 65+) 06/24/2019, 06/21/2020   Influenza, High Dose Seasonal PF 09/17/2018   Influenza-Unspecified 06/01/2021, 08/10/2022   PFIZER(Purple Top)SARS-COV-2 Vaccination 11/24/2019, 12/23/2019   Pneumococcal Conjugate-13 04/11/2016   Pneumococcal Polysaccharide-23 05/15/2017   Tdap 04/02/2017   Zoster Recombinant(Shingrix) 10/18/2020, 12/20/2020   Pertinent  Health Maintenance Due  Topic Date Due   INFLUENZA VACCINE  04/26/2023   Colonoscopy  07/27/2024      09/15/2021    9:21 AM 08/14/2022    9:31 AM 10/11/2022   11:04 AM 01/19/2023   10:53 AM 05/22/2023    9:18 AM  Fall Risk  Falls in the past year? 0 0 0 0 0  Was there an injury with Fall? 0 0 0 0 0  Fall Risk Category Calculator 0  0 0 0 0  Fall Risk Category (Retired) Low Low     (RETIRED) Patient Fall Risk Level  Low fall risk     Patient at Risk for Falls Due to  No Fall Risks No Fall Risks History of fall(s);No Fall Risks No Fall Risks  Fall risk Follow up  Falls evaluation completed Falls evaluation completed     Functional Status Survey:    Vitals:   05/22/23 0808  BP: (!) 142/80  Pulse: 76  Resp: 18  Temp: 97.9 F (36.6 C)  SpO2: 97%  Weight: 210 lb (95.3 kg)  Height: 5\' 11"  (1.803 m)   Body mass index is 29.29 kg/m. Physical Exam Vitals reviewed.  Constitutional:      General: He is not in acute distress.    Appearance: Normal appearance. He is overweight. He is not ill-appearing or diaphoretic.  HENT:     Head: Normocephalic.     Right Ear: Tympanic membrane, ear canal and external ear normal. There is no impacted cerumen.      Left Ear: Tympanic membrane, ear canal and external ear normal. There is no impacted cerumen.     Nose: Nose normal. No congestion or rhinorrhea.     Mouth/Throat:     Mouth: Mucous membranes are moist.     Pharynx: Oropharynx is clear. No oropharyngeal exudate or posterior oropharyngeal erythema.  Eyes:     General: No scleral icterus.       Right eye: No discharge.        Left eye: No discharge.     Extraocular Movements: Extraocular movements intact.     Conjunctiva/sclera: Conjunctivae normal.     Pupils: Pupils are equal, round, and reactive to light.  Neck:     Vascular: No carotid bruit.  Cardiovascular:     Rate and Rhythm: Normal rate and regular rhythm.     Pulses: Normal pulses.     Heart sounds: Normal heart sounds. No murmur heard.    No friction rub. No gallop.  Pulmonary:     Effort: Pulmonary effort is normal. No respiratory distress.     Breath sounds: Normal breath sounds. No wheezing, rhonchi or rales.  Chest:     Chest wall: No tenderness.  Abdominal:     General: Bowel sounds are normal. There is no distension.     Palpations: Abdomen is soft. There is no mass.     Tenderness: There is no abdominal tenderness. There is no right CVA tenderness, left CVA tenderness, guarding or rebound.  Musculoskeletal:        General: No swelling or tenderness. Normal range of motion.     Cervical back: Normal range of motion. No rigidity or tenderness.     Right lower leg: No edema.     Left lower leg: No edema.  Feet:     Right foot:     Skin integrity: Skin integrity normal. No ulcer, skin breakdown, erythema, warmth, callus or dry skin.     Toenail Condition: Fungal disease present.    Left foot:     Skin integrity: Skin integrity normal.     Toenail Condition: Fungal disease present. Lymphadenopathy:     Cervical: No cervical adenopathy.  Skin:    General: Skin is warm and dry.     Coloration: Skin is not pale.     Findings: No bruising, erythema, lesion or  rash.  Neurological:     Mental Status: He is alert and oriented to person, place,  and time.     Cranial Nerves: No cranial nerve deficit.     Sensory: No sensory deficit.     Motor: No weakness.     Coordination: Coordination normal.     Gait: Gait normal.  Psychiatric:        Mood and Affect: Mood normal.        Speech: Speech normal.        Behavior: Behavior normal.        Thought Content: Thought content normal.        Judgment: Judgment normal.     Labs reviewed: Recent Labs    10/25/22 0533 10/27/22 0631 11/13/22 0817  NA 136 137 142  K 3.2* 3.5 4.5  CL 98 106 107  CO2 27 23 25   GLUCOSE 100* 88 88  BUN 19 14 17   CREATININE 1.43* 1.10 1.10  CALCIUM 8.8* 8.7* 9.5   Recent Labs    10/05/22 1000 10/24/22 1237 10/25/22 0533 11/13/22 0817  AST 42* 145* 106* 23  ALT 25 53* 48* 15  ALKPHOS 43 63 61  --   BILITOT 0.9 2.2* 3.0* 0.4  PROT 8.1 7.2 6.7 7.9  ALBUMIN 4.3 3.1* 2.9*  --    Recent Labs    07/10/22 0803 10/05/22 1000 10/24/22 0515 10/24/22 1800 10/26/22 1652 10/27/22 0631 11/13/22 0817  WBC 5.1 5.7 8.9  --   --   --  6.8  NEUTROABS 1,984  --   --   --   --   --  3,108  HGB 12.8* 12.4* 7.8*   < > 7.7* 7.4* 9.1*  HCT 39.4 40.4 24.3*   < > 22.5* 22.1* 28.7*  MCV 88.7 92.4 88.4  --   --   --  87.5  PLT 252 238 234  --   --   --  392   < > = values in this interval not displayed.   Lab Results  Component Value Date   TSH 1.97 11/13/2022   Lab Results  Component Value Date   HGBA1C 5.4 10/13/2019   Lab Results  Component Value Date   CHOL 84 11/13/2022   HDL 35 (L) 11/13/2022   LDLCALC 33 11/13/2022   LDLDIRECT 62.0 10/09/2018   TRIG 75 11/13/2022   CHOLHDL 2.4 11/13/2022    Significant Diagnostic Results in last 30 days:  No results found.  Assessment/Plan 1. Essential hypertension B/p not at goal  Advised to increase amlodipine from 5 to 10 mg tablet daily - continue on hydrochlorothiazide   - Advised to check Blood pressure at  home and notify provider if B/p > 140/90  - dietary modification and exercise advised  - amLODipine (NORVASC) 10 MG tablet; Take 1 tablet (10 mg total) by mouth daily.  Dispense: 90 tablet; Refill: 1 - CBC with Differential/Platelet - COMPLETE METABOLIC PANEL WITH GFR - TSH - hydrochlorothiazide (HYDRODIURIL) 25 MG tablet; Take 1 tablet (25 mg total) by mouth daily.  Dispense: 90 tablet; Refill: 3  2. Mixed hyperlipidemia Previous LDL at goal  - continue on Atorvastatin and fenofibrate  - dietary modification and exercise advised  - Lipid panel  3. Pulmonary emphysema, unspecified emphysema type (HCC) Breath stable - continue Breztri ,albuterol and Zyzal   4. Coronary artery arteriosclerosis Chest pain free -  dietary modification and exercise advised  - continue on ASA and Atorvastatin   5. Chronic low back pain with sciatica, sciatica laterality unspecified, unspecified back pain laterality Chronic  - continue on Gabapentin  -  gabapentin (NEURONTIN) 300 MG capsule; Take 1 capsule (300 mg total) by mouth 3 (three) times daily.  Dispense: 90 capsule; Refill: 1  6. Numbness and tingling of both lower extremities Chronic  - gabapentin (NEURONTIN) 300 MG capsule; Take 1 capsule (300 mg total) by mouth 3 (three) times daily.  Dispense: 90 capsule; Refill: 1  7. Normocytic anemia Asymptomatic  Will recheck H/H   8. Aortic atherosclerosis (HCC) Continue to control high risk factors   Family/ staff Communication: Reviewed plan of care with patient verbalized understanding   Labs/tests ordered: - CBC with Differential/Platelet - COMPLETE METABOLIC PANEL WITH GFR - TSH - Lipid panel  Next Appointment : Return in about 6 months (around 11/22/2023) for medical mangement of chronic issues.Caesar Bookman, NP

## 2023-05-22 NOTE — Patient Instructions (Signed)
-   increase Amlodipine from 5 mg tablet to 10 mg tablet daily may use 2 tablet until current medication is completed then pick up the 10 mg tablet from pharmacy.  - Exercise at least three times per week for 30 minutes  - Increase fruits and vegetables in your diet

## 2023-05-23 LAB — LIPID PANEL
Cholesterol: 153 mg/dL (ref <200)
HDL: 40 mg/dL (ref >=40)
LDL Cholesterol (Calc): 94 mg/dL
Non-HDL Cholesterol (Calc): 113 mg/dL (ref <130)
Total CHOL/HDL Ratio: 3.8 (calc) (ref <5.0)
Triglycerides: 95 mg/dL (ref <150)

## 2023-05-23 LAB — CBC WITH DIFFERENTIAL/PLATELET
Absolute Monocytes: 495 {cells}/uL (ref 200–950)
Basophils Absolute: 50 cells/uL (ref 0–200)
Basophils Relative: 0.9 %
Eosinophils Absolute: 303 cells/uL (ref 15–500)
Eosinophils Relative: 5.5 %
HCT: 33.2 % — ABNORMAL LOW (ref 38.5–50.0)
Hemoglobin: 10.1 g/dL — ABNORMAL LOW (ref 13.2–17.1)
Lymphs Abs: 2167 cells/uL (ref 850–3900)
MCH: 24.7 pg — ABNORMAL LOW (ref 27.0–33.0)
MCHC: 30.4 g/dL — ABNORMAL LOW (ref 32.0–36.0)
MCV: 81.2 fL (ref 80.0–100.0)
MPV: 10.8 fL (ref 7.5–12.5)
Monocytes Relative: 9 %
Neutro Abs: 2486 {cells}/uL (ref 1500–7800)
Neutrophils Relative %: 45.2 %
Platelets: 364 10*3/uL (ref 140–400)
RBC: 4.09 10*6/uL — ABNORMAL LOW (ref 4.20–5.80)
RDW: 14.4 % (ref 11.0–15.0)
Total Lymphocyte: 39.4 %
WBC: 5.5 10*3/uL (ref 3.8–10.8)

## 2023-05-23 LAB — COMPLETE METABOLIC PANEL WITH GFR
AG Ratio: 1.3 (calc) (ref 1.0–2.5)
ALT: 17 U/L (ref 9–46)
AST: 23 U/L (ref 10–35)
Albumin: 4.3 g/dL (ref 3.6–5.1)
Alkaline phosphatase (APISO): 94 U/L (ref 35–144)
BUN: 13 mg/dL (ref 7–25)
CO2: 27 mmol/L (ref 20–32)
Calcium: 9.9 mg/dL (ref 8.6–10.3)
Chloride: 105 mmol/L (ref 98–110)
Creat: 1.07 mg/dL (ref 0.70–1.28)
Globulin: 3.4 g/dL (ref 1.9–3.7)
Glucose, Bld: 87 mg/dL (ref 65–99)
Potassium: 5.1 mmol/L (ref 3.5–5.3)
Sodium: 140 mmol/L (ref 135–146)
Total Bilirubin: 0.5 mg/dL (ref 0.2–1.2)
Total Protein: 7.7 g/dL (ref 6.1–8.1)
eGFR: 73 mL/min/{1.73_m2} (ref >=60)

## 2023-05-23 LAB — TSH: TSH: 1.08 m[IU]/L (ref 0.40–4.50)

## 2023-05-24 ENCOUNTER — Ambulatory Visit: Payer: Self-pay

## 2023-05-24 NOTE — Patient Outreach (Addendum)
  Care Coordination   05/24/2023 Name: Delsin Alires MRN: [id number] DOB: 19-Feb-1951   Care Coordination Outreach Attempts:  An unsuccessful telephone outreach was attempted for a scheduled appointment today.  Follow Up Plan:  No further outreach attempts will be made at this time. We have been unable to contact the patient to offer or enroll patient in care coordination services  Encounter Outcome:  No Answer   Care Coordination Interventions:  No, not indicated    Delsa Sale, RN, BSN, CCM Care Management Coordinator [facility] Care Management  Direct Phone: [phone]

## 2023-06-12 ENCOUNTER — Other Ambulatory Visit: Payer: Self-pay

## 2023-06-12 MED ORDER — ATORVASTATIN CALCIUM 40 MG PO TABS: 40.0000 mg | ORAL_TABLET | Freq: Every day | ORAL | 3 refills | Status: DC

## 2023-06-12 MED ORDER — PANTOPRAZOLE SODIUM 40 MG PO TBEC: 40.0000 mg | DELAYED_RELEASE_TABLET | Freq: Two times a day (BID) | ORAL | 3 refills | Status: DC

## 2023-06-18 NOTE — Addendum Note (Signed)
Addended by: Michaell Cowing on: 06/18/2023 01:19 PM   Modules accepted: Orders

## 2023-06-27 ENCOUNTER — Ambulatory Visit: Payer: Medicare Other

## 2023-07-20 ENCOUNTER — Ambulatory Visit (INDEPENDENT_AMBULATORY_CARE_PROVIDER_SITE_OTHER): Payer: Medicare Other | Admitting: Nurse Practitioner

## 2023-07-20 ENCOUNTER — Encounter: Payer: Self-pay | Admitting: Nurse Practitioner

## 2023-07-20 ENCOUNTER — Ambulatory Visit (INDEPENDENT_AMBULATORY_CARE_PROVIDER_SITE_OTHER): Payer: Medicare Other

## 2023-07-20 VITALS — BP 118/70 | HR 75 | Temp 98.2°F | Ht 71.0 in | Wt 211.8 lb

## 2023-07-20 DIAGNOSIS — J432 Centrilobular emphysema: Secondary | ICD-10-CM | POA: Diagnosis not present

## 2023-07-20 DIAGNOSIS — R058 Other specified cough: Secondary | ICD-10-CM | POA: Diagnosis not present

## 2023-07-20 DIAGNOSIS — J4530 Mild persistent asthma, uncomplicated: Secondary | ICD-10-CM | POA: Diagnosis not present

## 2023-07-20 DIAGNOSIS — J440 Chronic obstructive pulmonary disease with acute lower respiratory infection: Secondary | ICD-10-CM

## 2023-07-20 DIAGNOSIS — R0602 Shortness of breath: Secondary | ICD-10-CM | POA: Diagnosis not present

## 2023-07-20 DIAGNOSIS — J209 Acute bronchitis, unspecified: Secondary | ICD-10-CM

## 2023-07-20 DIAGNOSIS — R059 Cough, unspecified: Secondary | ICD-10-CM | POA: Diagnosis not present

## 2023-07-20 MED ORDER — PROMETHAZINE-DM 6.25-15 MG/5ML PO SYRP: 5.0000 mL | ORAL_SOLUTION | Freq: Four times a day (QID) | ORAL | 0 refills | Status: DC | PRN

## 2023-07-20 MED ORDER — ALBUTEROL SULFATE HFA 108 (90 BASE) MCG/ACT IN AERS: 2.0000 | INHALATION_SPRAY | Freq: Four times a day (QID) | RESPIRATORY_TRACT | 2 refills | Status: DC | PRN

## 2023-07-20 MED ORDER — FLUTICASONE PROPIONATE 50 MCG/ACT NA SUSP: 2.0000 | Freq: Every day | NASAL | 2 refills | Status: DC

## 2023-07-20 MED ORDER — PREDNISONE 20 MG PO TABS
40.0000 mg | ORAL_TABLET | Freq: Every day | ORAL | 0 refills | Status: AC
Start: 2023-07-20 — End: 2023-07-25

## 2023-07-20 MED ORDER — BENZONATATE 200 MG PO CAPS: 200.0000 mg | ORAL_CAPSULE | Freq: Three times a day (TID) | ORAL | 1 refills | Status: DC | PRN

## 2023-07-20 MED ORDER — AZITHROMYCIN 250 MG PO TABS: ORAL_TABLET | ORAL | 0 refills | Status: DC

## 2023-07-20 NOTE — Assessment & Plan Note (Signed)
Acute bronchitis/AECOPD likely secondary to viral illness. We will treat him with empiric azithromycin and prednisone burst. Initiate cough control measures. Target postnasal drainage to limit further upper airway irritation. Educated on importance of compliance with maintenance inhaler and proper use. Verbalized understanding. Action plan in place. ED/return precautions. CXR today to rule out superimposed infection.  Patient Instructions  Increase Breztri to 2 puffs Twice daily. Brush tongue and rinse mouth afterwards.  Continue Albuterol inhaler 2 puffs every 6 hours as needed for shortness of breath or wheezing. Notify if symptoms persist despite rescue inhaler/neb use.  Continue xyzal 1 tab daily for allergies    -Prednisone 40 mg daily for 5 days. Take in AM with food -Azithromycin - take 2 tabs on day one then 1 tab daily for four additional days. Take with food -Promethazine DM cough syrup 5 mL every 6 hours as needed for cough. May cause drowsiness. Do not drive after taking -Benzonatate 1 capsule Three times a day as needed for cough -Flonase nasal spray 2 sprays each nostril daily for postnasal drainage -Guaifenesin (Mucinex) 600 mg Twice daily for cough/congestion    Follow up with Dr. Delton Coombes or Katie Dayrin Stallone,NP in  4-5 weeks. If symptoms do not improve or worsen, please contact office for sooner follow up or seek emergency care.

## 2023-07-20 NOTE — Patient Instructions (Addendum)
Increase Breztri to 2 puffs Twice daily. Brush tongue and rinse mouth afterwards.  Continue Albuterol inhaler 2 puffs every 6 hours as needed for shortness of breath or wheezing. Notify if symptoms persist despite rescue inhaler/neb use.  Continue xyzal 1 tab daily for allergies    -Prednisone 40 mg daily for 5 days. Take in AM with food -Azithromycin - take 2 tabs on day one then 1 tab daily for four additional days. Take with food -Promethazine DM cough syrup 5 mL every 6 hours as needed for cough. May cause drowsiness. Do not drive after taking -Benzonatate 1 capsule Three times a day as needed for cough -Flonase nasal spray 2 sprays each nostril daily for postnasal drainage -Guaifenesin (Mucinex) 600 mg Twice daily for cough/congestion    Follow up with Dr. Delton Coombes or Katie Arlie Posch,NP in  4-5 weeks. If symptoms do not improve or worsen, please contact office for sooner follow up or seek emergency care.

## 2023-07-20 NOTE — Progress Notes (Signed)
@Patient  ID: Todd Mendoza, male    DOB: 15-May-1951, 72 y.o.   MRN: [id number]  Chief Complaint  Patient presents with   Acute Visit    Dry Cough, wheeze and chest congestion when laying down x 6 weeks.  Some SOB.    Referring provider: Ngetich, Donalee Citrin, NP  HPI: 72 year old male, former smoker followed for asthma, emphysema, and pulmonary nodules. He is a patient of Dr. Kavin Leech and last seen in office 05/16/2023 by [facility] NP. Past medical history significant for HTN, allergic rhinitis, HLD.  TEST/EVENTS:  08/09/2022 LDCT chest: atherosclerosis. Mild centrilobular emphysema. Calcified and noncalcified nodules, largest measuring 3.1 mm. Lung RADS 2 10/03/2022 PFT: FVC 50, FEV1 53, ratio 85, TLC 86, DLCOcor 70. Positive bronchodilator response (21% change) 04/12/2023 echo: EF 60 to 65%.  RV size and function normal.  Mild MR.  Borderline dilatation of ascending aorta.  08/23/2022: OV with Dr. Delton Coombes for initial consult. Referred by his PC for suspected COPD. He also had LDCT for lung cancer screening with pulmonary nodular disease. Reports exertional SOB with walking, extended activity. No real cough. Does sometimes hear rattling in his chest. Able to do chores, work outside. Not on any BD. Emphysematous changes on imaging. PFTs ordered for further evaluation. Lung RADS 2 on imaging; plan for repeat in 1 year, November 2024.  10/04/2022: OV with Eleana Tocco NP for follow-up after undergoing pulmonary function testing.  He had FVC of 50, FEV1 of 53, ratio of 85, TLC of 86 and DLCO 70.  He did have significant bronchodilator response with 21% improvement.  Today, he tells me that he is feeling relatively unchanged compared to when he was here last.  He has never been on any inhaler therapies.  He gets short winded with strenuous activity and long distance walking.  Really notices it with uphill/stair climbing.  He has an occasional cough, which is usually dry but sometimes will produce a small amount of clear  mucus in the mornings.  Does notice an occasional wheeze.  No history of childhood asthma.  Occasionally has some allergy type symptoms in the fall and spring.  11/15/2022: OV with Gaetan Spieker NP for follow up after being started on Breo for suspected asthma/emphysema.  Since he was here last, he has had some trouble with anemia following a neurosurgery procedure.  He presented to the ED with worsening back pain, new hiccoughs, abd distention, and some bloody stool with mild fever. He had positive guaiac. He was hospitalized from 10/24/2022 to 10/27/2022 for this and treated with blood transfusion. He underwent endoscopy with GI; esophageal candidiasis. He improved and was discharged on fluconazole and PPI. He had follow up with his PCP on Monday 2/19 and hgb had improved to 9.1 from 7.4. He feels like he is recovering well. Denies any lightheadedness or dizziness.  Regarding his breathing, he started using the Breo daily and albuterol inhaler PRN.  He has not noticed that the [facility] helps with his shortness of breath much; however, he feels like the albuterol does a great job and helps open up his chest.  He has been using this twice daily to help with his baseline dyspnea.  Feels that it gives him a little more stamina.  He has not noticed any significant cough since he was here last.  Has not noticed any wheezing either.  02/13/2023: OV with Kaelen Brennan NP for 84-month follow-up.  He has been doing okay since he was here last.  Still has trouble with getting  short winded with most activities other than walking on level ground at his own pace.  He does feel like the Markus Daft has helped him a little bit more than the Breo did.  He also notices that he tends to get a little more short winded when he is outside doing something.  He is not sure if this is his allergies or not.  Has not noticed any significant cough.  Denies any wheezing, leg swelling, orthopnea, PND, palpitations, chest pain.  Still using his rescue 1-2 times a  day.  05/16/2023: Ov with Zamiah Tollett NP for follow-up.  Echo was normal after his last visit without any evidence of heart failure, pulmonary hypertension or significant valvular disease that would be contributing to his dyspnea.  He feels about the same.  He does feel that the Markus Daft helps him a lot.  Prefers to use this over anything else.  He has tried Production manager without much success.  For some reason, the pharmacy had refilled his Virgel Bouquet this last time instead of giving him the [place].  No increased cough, chest congestion, wheezing, leg swelling.  Using rescue a few times a week but no longer having to use it daily.  07/20/2023: Today - acute Patient presents today for acute visit.  He got sick about 6 weeks ago.  He thought he does have a cold but the cough has persisted.  It is dry.  He is unable to produce any phlegm.  He does feel like his chest is congested and tight.  He is also wheezing, especially at night. He's feeling more short winded. Just can't quite seem to get back to his normal. He tried some cough medicine over the counter but it didn't seem to help so he hasn't been using it. He does have some postnasal drainage but sinus don't feel congested otherwise. He denies any fevers, chills, hemoptysis, leg swelling, orthopnea. He tells me he wasn't using the [facility] consistently. He restarted it but he's only been using it once a day for the past few weeks. He's out of albuterol.   No Known Allergies  Immunization History  Administered Date(s) Administered   Fluad Quad(high Dose 65+) 06/24/2019, 06/21/2020   Fluad Trivalent(High Dose 65+) 05/22/2023   Influenza, High Dose Seasonal PF 09/17/2018   Influenza-Unspecified 06/01/2021, 08/10/2022   PFIZER(Purple Top)SARS-COV-2 Vaccination 11/24/2019, 12/23/2019   Pfizer(Comirnaty)Fall Seasonal Vaccine 12 years and older 05/28/2023   Pneumococcal Conjugate-13 04/11/2016   Pneumococcal Polysaccharide-23 05/15/2017   Tdap 04/02/2017   Zoster  Recombinant(Shingrix) 10/18/2020, 12/20/2020    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   Hyperlipidemia    Hypertension    Stroke (HCC)    Vertigo     Tobacco History: Social History   Tobacco Use  Smoking Status Former   Current packs/day: 0.00   Average packs/day: 0.2 packs/day for 43.0 years (8.6 ttl pk-yrs)   Types: Cigarettes   Start date: 1966   Quit date: 2009   Years since quitting: 15.8   Passive exposure: Past  Smokeless Tobacco Never   Counseling given: Not Answered   Outpatient Medications Prior to Visit  Medication Sig Dispense Refill   amLODipine (NORVASC) 10 MG tablet Take 1 tablet (10 mg total) by mouth daily. 90 tablet 1   aspirin EC 81 MG  tablet Take 1 tablet (81 mg total) by mouth daily. 30 tablet 0   atorvastatin (LIPITOR) 40 MG tablet Take 1 tablet (40 mg total) by mouth daily. 90 tablet 3   Budeson-Glycopyrrol-Formoterol (BREZTRI AEROSPHERE) 160-9-4.8 MCG/ACT AERO Inhale 2 puffs into the lungs in the morning and at bedtime. (Patient taking differently: Inhale 2 puffs into the lungs every morning. Patient using 2 puffs every morning as a rescue inhaler) 10.7 g 12   fenofibrate (TRICOR) 145 MG tablet TAKE 1 TABLET BY MOUTH EVERY DAY 90 tablet 1   finasteride (PROSCAR) 5 MG tablet TAKE 1 TABLET (5 MG TOTAL) BY MOUTH DAILY. 90 tablet 1   gabapentin (NEURONTIN) 300 MG capsule Take 1 capsule (300 mg total) by mouth 3 (three) times daily. 90 capsule 1   hydrochlorothiazide (HYDRODIURIL) 25 MG tablet Take 1 tablet (25 mg total) by mouth daily. 90 tablet 3   levocetirizine (XYZAL) 5 MG tablet Take 1 tablet (5 mg total) by mouth daily as needed for allergies. 30 tablet 5   methocarbamol (ROBAXIN) 500 MG tablet Take 1 tablet (500 mg total) by mouth 4 (four) times daily. 40 tablet 1   Multiple Vitamins-Minerals (MULTIVITAMIN WITH  MINERALS) tablet Take 1 tablet by mouth daily. 30 tablet 5   pantoprazole (PROTONIX) 40 MG tablet Take 1 tablet (40 mg total) by mouth 2 (two) times daily. 180 tablet 3   silodosin (RAPAFLO) 8 MG CAPS capsule Take 8 mg by mouth daily.     albuterol (VENTOLIN HFA) 108 (90 Base) MCG/ACT inhaler TAKE 2 PUFFS BY MOUTH EVERY 6 HOURS AS NEEDED FOR WHEEZE OR SHORTNESS OF BREATH 8.5 each 2   No facility-administered medications prior to visit.     Review of Systems:   Constitutional: No weight loss or gain, night sweats, fevers, chills, fatigue, or lassitude. HEENT: No headaches, difficulty swallowing, tooth/dental problems, or sore throat. No sneezing, itching, ear ache, nasal congestion. +post nasal drip CV:  No chest pain, orthopnea, PND, swelling in lower extremities, anasarca, dizziness, palpitations, syncope Resp:+shortness of breath with exertion; dry cough; wheeze; chest tightness. No excess mucus or change in color of mucus. No hemoptysis.  No chest wall deformity GI:  No heartburn, indigestion GU: No dysuria, change in color of urine, urgency or frequency.   Skin: No rash, lesions, ulcerations MSK:  No joint pain or swelling.  Neuro: No dizziness or lightheadedness.  Psych: No depression or anxiety. Mood stable.     Physical Exam:  BP 118/70 (BP Location: Right Arm, Patient Position: Sitting, Cuff Size: Large)   Pulse 75   Temp 98.2 F (36.8 C) (Oral)   Ht 5\' 11"  (1.803 m)   Wt 211 lb 12.8 oz (96.1 kg)   SpO2 96%   BMI 29.54 kg/m   GEN: Pleasant, interactive, well-appearing; obese; in no acute distress HEENT:  Normocephalic and atraumatic. PERRLA. Sclera white. Nasal turbinates erythematous, moist and patent bilaterally. Clear rhinorrhea present. Oropharynx pink and moist, without exudate or edema. No lesions, ulcerations, or postnasal drip.  NECK:  Supple w/ fair ROM. No JVD present. Normal carotid impulses w/o bruits. Thyroid symmetrical with no goiter or nodules palpated.  No lymphadenopathy.   CV: RRR, no m/r/g, no peripheral edema. Pulses intact, +2 bilaterally. No cyanosis, pallor or clubbing. PULMONARY:  Unlabored, regular breathing. Diminished bilaterally with end expiratory wheeze A&P. No accessory muscle use.  GI: BS present and normoactive. Soft, non-tender to palpation. No organomegaly or masses detected.  MSK: No erythema, warmth or tenderness. Cap  refil <2 sec all extrem. No deformities or joint swelling noted.  Neuro: A/Ox3. No focal deficits noted.   Skin: Warm, no lesions or rashe Psych: Normal affect and behavior. Judgement and thought content appropriate.     Lab Results:  CBC    Component Value Date/Time   WBC 5.5 05/22/2023 1041   RBC 4.09 (L) 05/22/2023 1041   HGB 10.1 (L) 05/22/2023 1041   HGB 11.9 (L) 04/03/2022 1633   HCT 33.2 (L) 05/22/2023 1041   HCT 37.1 (L) 04/03/2022 1633   PLT 364 05/22/2023 1041   PLT 205 04/03/2022 1633   MCV 81.2 05/22/2023 1041   MCV 88 04/03/2022 1633   MCH 24.7 (L) 05/22/2023 1041   MCHC 30.4 (L) 05/22/2023 1041   RDW 14.4 05/22/2023 1041   RDW 12.5 04/03/2022 1633   LYMPHSABS 2,167 05/22/2023 1041   LYMPHSABS 2.6 05/18/2021 0957   MONOABS 0.6 02/13/2019 0816   EOSABS 303 05/22/2023 1041   EOSABS 0.4 05/18/2021 0957   BASOSABS 50 05/22/2023 1041   BASOSABS 0.0 05/18/2021 0957    BMET    Component Value Date/Time   NA 140 05/22/2023 1041   NA 145 (H) 04/03/2022 1633   K 5.1 05/22/2023 1041   CL 105 05/22/2023 1041   CO2 27 05/22/2023 1041   GLUCOSE 87 05/22/2023 1041   BUN 13 05/22/2023 1041   BUN 10 04/03/2022 1633   CREATININE 1.07 05/22/2023 1041   CALCIUM 9.9 05/22/2023 1041   GFRNONAA >60 10/27/2022 0631   GFRAA 88 11/08/2020 0925    BNP No results found for: "BNP"   Imaging:  No results found.  Administration History     None          Latest Ref Rng & Units 10/03/2022    3:13 PM  PFT Results  FVC-Pre L 2.29   FVC-Predicted Pre % 50   FVC-Post L 2.53    FVC-Predicted Post % 55   Pre FEV1/FVC % % 77   Post FEV1/FCV % % 85   FEV1-Pre L 1.77   FEV1-Predicted Pre % 53   FEV1-Post L 2.15   DLCO uncorrected ml/min/mmHg 18.67   DLCO UNC% % 70   DLCO corrected ml/min/mmHg 18.67   DLCO COR %Predicted % 70   DLVA Predicted % 86   TLC L 6.29   TLC % Predicted % 86   RV % Predicted % 103     No results found for: "NITRICOXIDE"      Assessment & Plan:   Acute bronchitis with COPD (HCC) Acute bronchitis/AECOPD likely secondary to viral illness. We will treat him with empiric azithromycin and prednisone burst. Initiate cough control measures. Target postnasal drainage to limit further upper airway irritation. Educated on importance of compliance with maintenance inhaler and proper use. Verbalized understanding. Action plan in place. ED/return precautions. CXR today to rule out superimposed infection.  Patient Instructions  Increase Breztri to 2 puffs Twice daily. Brush tongue and rinse mouth afterwards.  Continue Albuterol inhaler 2 puffs every 6 hours as needed for shortness of breath or wheezing. Notify if symptoms persist despite rescue inhaler/neb use.  Continue xyzal 1 tab daily for allergies    -Prednisone 40 mg daily for 5 days. Take in AM with food -Azithromycin - take 2 tabs on day one then 1 tab daily for four additional days. Take with food -Promethazine DM cough syrup 5 mL every 6 hours as needed for cough. May cause drowsiness. Do not drive after taking -  Benzonatate 1 capsule Three times a day as needed for cough -Flonase nasal spray 2 sprays each nostril daily for postnasal drainage -Guaifenesin (Mucinex) 600 mg Twice daily for cough/congestion    Follow up with Dr. Delton Coombes or Katie Torrence Hammack,NP in  4-5 weeks. If symptoms do not improve or worsen, please contact office for sooner follow up or seek emergency care.    Post-viral cough syndrome See above   I spent 35 minutes of dedicated to the care of this patient on the date  of this encounter to include pre-visit review of records, face-to-face time with the patient discussing conditions above, post visit ordering of testing, clinical documentation with the electronic health record, making appropriate referrals as documented, and communicating necessary findings to members of the patients care team.  Noemi Chapel, NP 07/20/2023  Pt aware and understands NP's role.

## 2023-07-20 NOTE — Assessment & Plan Note (Signed)
See above

## 2023-08-08 ENCOUNTER — Telehealth: Payer: Self-pay | Admitting: Nurse Practitioner

## 2023-08-08 NOTE — Telephone Encounter (Signed)
Spoke with patient's wife Todd Mendoza and also to patient regarding prior message. Patient stated he has a cough,chest congestion clear mucus for about 4 days.Patient stated he did try using Breztri but stated his lips turned white and started to swell .Advised patient not to use Breztri again due to swelling of his lips. Advised patient I will send this message to Florentina Addison to advise.  Florentina Addison can you please advise   Thank you

## 2023-08-08 NOTE — Telephone Encounter (Signed)
Spoke with patient regarding prior message. Patient stated he is still not feeling well . Per Florentina Addison I will send in Trelegy for patient instead of Breztri.Katie advised patient to go to urgent care if not feeling well . Patient does have a office visit in 08/2023. Patient stated he will keep his office visit and take OTC Mucinex.  Patient's voice was understanding.Nothing else further needed.

## 2023-08-08 NOTE — Telephone Encounter (Signed)
Patients wife calling requesting to see Rhunette Croft ASAP due to breathing issues, needs to be seen sooner than 12/6, there is nothing available. Please advise on breathing issues

## 2023-08-08 NOTE — Telephone Encounter (Signed)
Was he better with our recent course of prednisone and abx and then symptoms returned after or did he never get better? Stop Breztri. Let's switch him to Trelegy 1 puff daily. Brush tongue and rinse mouth afterwards. He tolerated Breo in the past, which has two of the same medicines as Trelegy so hopefully he will do fine with this. It could have been one of the preservatives in [place]. Add to allergy list. Thanks.

## 2023-08-09 ENCOUNTER — Telehealth: Payer: Self-pay | Admitting: Nurse Practitioner

## 2023-08-09 NOTE — Telephone Encounter (Signed)
Veronice wife checking on RX for Trelegy. States pharmacy does not have it. Pharmacy is [facility] [address] [name] phone number is [phone].

## 2023-08-10 MED ORDER — TRELEGY ELLIPTA 100-62.5-25 MCG/ACT IN AEPB: 1.0000 | INHALATION_SPRAY | Freq: Every day | RESPIRATORY_TRACT | 11 refills | Status: DC

## 2023-08-10 NOTE — Telephone Encounter (Signed)
Rx for Trelegy has been sent to pharmacy and mychart sent to pt making him aware.

## 2023-08-13 ENCOUNTER — Ambulatory Visit: Payer: Medicare Other | Admitting: Adult Health

## 2023-08-13 ENCOUNTER — Telehealth: Payer: Self-pay | Admitting: Adult Health

## 2023-08-13 ENCOUNTER — Encounter: Payer: Self-pay | Admitting: Adult Health

## 2023-08-13 ENCOUNTER — Inpatient Hospital Stay: Admission: RE | Admit: 2023-08-13 | Payer: Medicare Other | Source: Ambulatory Visit

## 2023-08-13 VITALS — BP 138/70 | HR 88 | Ht 71.0 in | Wt 214.0 lb

## 2023-08-13 DIAGNOSIS — J4530 Mild persistent asthma, uncomplicated: Secondary | ICD-10-CM

## 2023-08-13 DIAGNOSIS — R918 Other nonspecific abnormal finding of lung field: Secondary | ICD-10-CM

## 2023-08-13 DIAGNOSIS — J440 Chronic obstructive pulmonary disease with acute lower respiratory infection: Secondary | ICD-10-CM | POA: Diagnosis not present

## 2023-08-13 DIAGNOSIS — J209 Acute bronchitis, unspecified: Secondary | ICD-10-CM

## 2023-08-13 DIAGNOSIS — R062 Wheezing: Secondary | ICD-10-CM

## 2023-08-13 MED ORDER — AMOXICILLIN-POT CLAVULANATE 875-125 MG PO TABS
1.0000 | ORAL_TABLET | Freq: Two times a day (BID) | ORAL | 0 refills | Status: DC
Start: 2023-08-13 — End: 2024-01-14

## 2023-08-13 MED ORDER — PREDNISONE 10 MG PO TABS: ORAL_TABLET | ORAL | 0 refills | Status: DC

## 2023-08-13 NOTE — Assessment & Plan Note (Signed)
Slow to resolve COPD exacerbation.  Chest x-ray last month showed no acute process.  Patient is due for it CT chest.  Have advised him this will need to be rescheduled if he needs assistance rescheduling this please reach out to our office. Will treat with empiric antibiotic and steroids Restart triple therapy maintenance inhaler.  Patient was unable to tolerate Breztri due to what sounds like oral candidiasis.  Oral inhaler care discussed in detail Patient was given albuterol nebulizer treatment in the office  Plan  Patient Instructions  Begin Trelegy 1 puff daily  Albuterol inhaler As needed    Prednisone taper over next week.  Augmentin 875mg  .Twice daily  for 1 week  Mucinex DM Twice daily  As needed    Reschedule CT chest. Call if unable to reschedule.   Follow up with Dr. Delton Coombes  next month as planned and As needed   Please contact office for sooner follow up if symptoms do not improve or worsen or seek emergency care

## 2023-08-13 NOTE — Patient Instructions (Addendum)
Begin Trelegy 1 puff daily  Albuterol inhaler As needed    Prednisone taper over next week.  Augmentin 875mg  .Twice daily  for 1 week  Mucinex DM Twice daily  As needed    Reschedule CT chest. Call if unable to reschedule.   Follow up with Dr. Delton Coombes  next month as planned and As needed   Please contact office for sooner follow up if symptoms do not improve or worsen or seek emergency care

## 2023-08-13 NOTE — Assessment & Plan Note (Signed)
Continue with low-dose CT screening program with serial CT.

## 2023-08-13 NOTE — Progress Notes (Signed)
@Patient  ID: Todd Mendoza, male    DOB: October 24, 1950, 72 y.o.   MRN: [id number]  Chief Complaint  Patient presents with   Acute Visit   Discussed the use of AI scribe software for clinical note transcription with the patient, who gave verbal consent to proceed.  Referring provider: NgetichDonalee Citrin, NP  HPI: 72 year old male former smoker followed for COPD with emphysema and asthma, pulmonary nodules Participates in the lung cancer CT screening program  TEST/EVENTS :  08/09/2022 LDCT chest: atherosclerosis. Mild centrilobular emphysema. Calcified and noncalcified nodules, largest measuring 3.1 mm. Lung RADS 2 10/03/2022 PFT: FVC 50, FEV1 53, ratio 85, TLC 86, DLCOcor 70. Positive bronchodilator response (21% change) 04/12/2023 echo: EF 60 to 65%.  RV size and function normal.  Mild MR.  Borderline dilatation of ascending aorta.   08/13/2023 Acute OV; COPD with Emphysema and Asthma  Patient presents for an acute office visit.  Patient complains of ongoing cough congestion over the last month.  Patient was seen 1 month ago for COPD exacerbation given a Z-Pak and prednisone burst.  Chest x-ray last month showed clear lungs.    He reports a significant amount of white mucus production. The patient had been prescribed Breztri inhaler, which was discontinued due to an adverse reaction causing lip swelling and whitening. Despite attempts to replace the inhaler, the patient has been without a COPD/asthma treatment for approximately two weeks, relying only on albuterol as a rescue inhaler.  Patient was supposed to have a CT chest today but will have to reschedule due to appointment conflict.  The patient denies fever, hemoptysis, and any new medications.       No Known Allergies  Immunization History  Administered Date(s) Administered   Fluad Quad(high Dose 65+) 06/24/2019, 06/21/2020   Fluad Trivalent(High Dose 65+) 05/22/2023   Influenza, High Dose Seasonal PF 09/17/2018    Influenza-Unspecified 06/01/2021, 08/10/2022   PFIZER(Purple Top)SARS-COV-2 Vaccination 11/24/2019, 12/23/2019   Pfizer(Comirnaty)Fall Seasonal Vaccine 12 years and older 05/28/2023   Pneumococcal Conjugate-13 04/11/2016   Pneumococcal Polysaccharide-23 05/15/2017   Tdap 04/02/2017   Zoster Recombinant(Shingrix) 10/18/2020, 12/20/2020    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   Hyperlipidemia    Hypertension    Stroke (HCC)    Vertigo     Tobacco History: Social History   Tobacco Use  Smoking Status Former   Current packs/day: 0.00   Average packs/day: 0.2 packs/day for 43.0 years (8.6 ttl pk-yrs)   Types: Cigarettes   Start date: 1966   Quit date: 2009   Years since quitting: 15.8   Passive exposure: Past  Smokeless Tobacco Never   Counseling given: Not Answered   Outpatient Medications Prior to Visit  Medication Sig Dispense Refill   albuterol (VENTOLIN HFA) 108 (90 Base) MCG/ACT inhaler Inhale 2 puffs into the lungs every 6 (six) hours as needed for wheezing or shortness of breath. 8.5 each 2   amLODipine (NORVASC) 10 MG tablet Take 1 tablet (10 mg total) by mouth daily. 90 tablet 1   aspirin EC 81 MG tablet Take 1 tablet (81 mg total) by mouth daily. 30 tablet 0   atorvastatin (LIPITOR) 40 MG tablet Take 1 tablet (40 mg total) by mouth daily. 90 tablet 3   fenofibrate (TRICOR) 145 MG tablet TAKE 1  TABLET BY MOUTH EVERY DAY 90 tablet 1   finasteride (PROSCAR) 5 MG tablet TAKE 1 TABLET (5 MG TOTAL) BY MOUTH DAILY. 90 tablet 1   fluticasone (FLONASE) 50 MCG/ACT nasal spray Place 2 sprays into both nostrils daily. 18.2 mL 2   Fluticasone-Umeclidin-Vilant (TRELEGY ELLIPTA) 100-62.5-25 MCG/ACT AEPB Inhale 1 puff into the lungs daily. 60 each 11   gabapentin (NEURONTIN) 300 MG capsule Take 1 capsule (300 mg total) by mouth 3 (three) times  daily. 90 capsule 1   hydrochlorothiazide (HYDRODIURIL) 25 MG tablet Take 1 tablet (25 mg total) by mouth daily. 90 tablet 3   levocetirizine (XYZAL) 5 MG tablet Take 1 tablet (5 mg total) by mouth daily as needed for allergies. 30 tablet 5   methocarbamol (ROBAXIN) 500 MG tablet Take 1 tablet (500 mg total) by mouth 4 (four) times daily. 40 tablet 1   Multiple Vitamins-Minerals (MULTIVITAMIN WITH MINERALS) tablet Take 1 tablet by mouth daily. 30 tablet 5   pantoprazole (PROTONIX) 40 MG tablet Take 1 tablet (40 mg total) by mouth 2 (two) times daily. 180 tablet 3   promethazine-dextromethorphan (PROMETHAZINE-DM) 6.25-15 MG/5ML syrup Take 5 mLs by mouth 4 (four) times daily as needed for cough. 180 mL 0   silodosin (RAPAFLO) 8 MG CAPS capsule Take 8 mg by mouth daily.     azithromycin (ZITHROMAX) 250 MG tablet Take 2 tabs on day one then 1 tab daily for four additional days 6 tablet 0   benzonatate (TESSALON) 200 MG capsule Take 1 capsule (200 mg total) by mouth 3 (three) times daily as needed for cough. 30 capsule 1   No facility-administered medications prior to visit.     Review of Systems:   Constitutional:   No  weight loss, night sweats,  Fevers, chills,  +fatigue, or  lassitude.  HEENT:   No headaches,  Difficulty swallowing,  Tooth/dental problems, or  Sore throat,                No sneezing, itching, ear ache, nasal congestion, post nasal drip,   CV:  No chest pain,  Orthopnea, PND, swelling in lower extremities, anasarca, dizziness, palpitations, syncope.   GI  No heartburn, indigestion, abdominal pain, nausea, vomiting, diarrhea, change in bowel habits, loss of appetite, bloody stools.   Resp:   No chest wall deformity  Skin: no rash or lesions.  GU: no dysuria, change in color of urine, no urgency or frequency.  No flank pain, no hematuria   MS:  No joint pain or swelling.  No decreased range of motion.  No back pain.    Physical Exam  BP 138/70 (BP Location: Left  Arm, Cuff Size: Large)   Pulse 88   Ht 5\' 11"  (1.803 m)   Wt 214 lb (97.1 kg)   SpO2 97%   BMI 29.85 kg/m   GEN: A/Ox3; pleasant , NAD, well nourished    HEENT:  Arthur/AT,  NOSE-clear, THROAT-clear, no lesions, no postnasal drip or exudate noted.   NECK:  Supple w/ fair ROM; no JVD; normal carotid impulses w/o bruits; no thyromegaly or nodules palpated; no lymphadenopathy.    RESP few scattered rhonchi.   no accessory muscle use, no dullness to percussion  CARD:  RRR, no m/r/g, no peripheral edema, pulses intact, no cyanosis or clubbing.  GI:   Soft & nt; nml bowel sounds; no organomegaly or masses detected.   Musco: Warm bil, no deformities or joint swelling noted.   Neuro: alert, no focal  deficits noted.    Skin: Warm, no lesions or rashes    Lab Results:  CBC    Component Value Date/Time   WBC 5.5 05/22/2023 1041   RBC 4.09 (L) 05/22/2023 1041   HGB 10.1 (L) 05/22/2023 1041   HGB 11.9 (L) 04/03/2022 1633   HCT 33.2 (L) 05/22/2023 1041   HCT 37.1 (L) 04/03/2022 1633   PLT 364 05/22/2023 1041   PLT 205 04/03/2022 1633   MCV 81.2 05/22/2023 1041   MCV 88 04/03/2022 1633   MCH 24.7 (L) 05/22/2023 1041   MCHC 30.4 (L) 05/22/2023 1041   RDW 14.4 05/22/2023 1041   RDW 12.5 04/03/2022 1633   LYMPHSABS 2,167 05/22/2023 1041   LYMPHSABS 2.6 05/18/2021 0957   MONOABS 0.6 02/13/2019 0816   EOSABS 303 05/22/2023 1041   EOSABS 0.4 05/18/2021 0957   BASOSABS 50 05/22/2023 1041   BASOSABS 0.0 05/18/2021 0957    BMET    Component Value Date/Time   NA 140 05/22/2023 1041   NA 145 (H) 04/03/2022 1633   K 5.1 05/22/2023 1041   CL 105 05/22/2023 1041   CO2 27 05/22/2023 1041   GLUCOSE 87 05/22/2023 1041   BUN 13 05/22/2023 1041   BUN 10 04/03/2022 1633   CREATININE 1.07 05/22/2023 1041   CALCIUM 9.9 05/22/2023 1041   GFRNONAA >60 10/27/2022 0631   GFRAA 88 11/08/2020 0925    BNP No results found for: "BNP"  ProBNP No results found for: "PROBNP"  Imaging: DG  Chest 2 View  Result Date: 07/20/2023 CLINICAL DATA:  Shortness of breath.  Cough for 6 weeks. EXAM: CHEST - 2 VIEW COMPARISON:  October 24, 2022. FINDINGS: The heart size and mediastinal contours are within normal limits. Both lungs are clear. The visualized skeletal structures are unremarkable. IMPRESSION: No active cardiopulmonary disease. Electronically Signed   By: Lupita Raider M.D.   On: 07/20/2023 15:32    Administration History     None          Latest Ref Rng & Units 10/03/2022    3:13 PM  PFT Results  FVC-Pre L 2.29   FVC-Predicted Pre % 50   FVC-Post L 2.53   FVC-Predicted Post % 55   Pre FEV1/FVC % % 77   Post FEV1/FCV % % 85   FEV1-Pre L 1.77   FEV1-Predicted Pre % 53   FEV1-Post L 2.15   DLCO uncorrected ml/min/mmHg 18.67   DLCO UNC% % 70   DLCO corrected ml/min/mmHg 18.67   DLCO COR %Predicted % 70   DLVA Predicted % 86   TLC L 6.29   TLC % Predicted % 86   RV % Predicted % 103     No results found for: "NITRICOXIDE"      Assessment & Plan:   Acute bronchitis with COPD (HCC) Slow to resolve COPD exacerbation.  Chest x-ray last month showed no acute process.  Patient is due for it CT chest.  Have advised him this will need to be rescheduled if he needs assistance rescheduling this please reach out to our office. Will treat with empiric antibiotic and steroids Restart triple therapy maintenance inhaler.  Patient was unable to tolerate Breztri due to what sounds like oral candidiasis.  Oral inhaler care discussed in detail Patient was given albuterol nebulizer treatment in the office  Plan  Patient Instructions  Begin Trelegy 1 puff daily  Albuterol inhaler As needed    Prednisone taper over next week.  Augmentin 875mg  .  Twice daily  for 1 week  Mucinex DM Twice daily  As needed    Reschedule CT chest. Call if unable to reschedule.   Follow up with Dr. Delton Coombes  next month as planned and As needed   Please contact office for sooner follow up if  symptoms do not improve or worsen or seek emergency care        Pulmonary nodules Continue with low-dose CT screening program with serial CT.     Rubye Oaks, NP 08/13/2023

## 2023-08-13 NOTE — Telephone Encounter (Signed)
Patient's wife is calling because patient was seen this morning and called pharmacy for prescription but it is too expensive. The trelegy was priced at $125. Patient is wanting to know if there is something else that can be prescribed for him that is cheaper.   Call back number [phone]  [facility] [address]

## 2023-08-14 NOTE — Telephone Encounter (Signed)
Patient's wife is calling. Trelegy is too expensive for patient. Is there an alternative?

## 2023-08-15 ENCOUNTER — Ambulatory Visit
Admission: RE | Admit: 2023-08-15 | Discharge: 2023-08-15 | Disposition: A | Discharge status: Home / Self Care | Payer: Medicare Other | Source: Ambulatory Visit | Attending: Family | Admitting: Family

## 2023-08-15 ENCOUNTER — Other Ambulatory Visit (HOSPITAL_COMMUNITY): Payer: Self-pay

## 2023-08-15 DIAGNOSIS — Z87891 Personal history of nicotine dependence: Secondary | ICD-10-CM | POA: Diagnosis not present

## 2023-08-15 DIAGNOSIS — R911 Solitary pulmonary nodule: Secondary | ICD-10-CM

## 2023-08-15 DIAGNOSIS — J439 Emphysema, unspecified: Secondary | ICD-10-CM

## 2023-08-15 NOTE — Telephone Encounter (Signed)
Patient is in the Coverage Gap phase contributing to the price. The alternative Triple Therapy of Todd Mendoza is $162.28. patient could use a combination of 2 inhalers but the price may be more expensive/the same as the Trelegy.   Covered inhalers Wixela/Generic Advair Diskus-$20.63 + Incruse Ellipta-$89.16=$109.79

## 2023-08-17 NOTE — Telephone Encounter (Signed)
Please let patient know that Todd Mendoza would be about $162 if that is a better option it is due to the medication gap phase/donut hole.  If unable to afford this option please leave him a sample of Trelegy at the front desk and he can discuss this at his follow-up visit in 2 weeks with Dr. Delton Coombes and decide on the next step may need to apply for patient assistance

## 2023-08-20 NOTE — Telephone Encounter (Signed)
LVM for pt/wife to return call.   PLEASE ADVISE PATIENT:  Per pharmacy Breztri is $162 per month due to donut hole.  Patient can have Trelegy sample and discuss other options at upcoming OV.

## 2023-08-21 ENCOUNTER — Telehealth: Payer: Self-pay | Admitting: Adult Health

## 2023-08-21 MED ORDER — CLOTRIMAZOLE 10 MG MT TROC: 10.0000 mg | Freq: Every day | OROMUCOSAL | 0 refills | Status: DC

## 2023-08-21 NOTE — Telephone Encounter (Signed)
Atc pt no answer, lvmm for pt or wife to give the office a call back.

## 2023-08-21 NOTE — Telephone Encounter (Signed)
Stop Trelegy .  Begin Mycelex troche 1 five times daily for 1 week. #35 no refills  If not better or worse needs to go to urgent care /ER for evaluation .  Albuterol inhaler As needed   Discuss alternative at follow up next with Dr. Delton Coombes for new inhaler.

## 2023-08-21 NOTE — Telephone Encounter (Signed)
Pt wife calling in bc her husband is taking trelegy and it has broken his mouth out

## 2023-08-21 NOTE — Telephone Encounter (Signed)
Called and spoke to patient.  He stated that since using Trelegy, he has developed white patches on tongue and inside mouth and swollen lips. He rinses his mouth out after each use. Sx developed 2 days ago. He started Trelegy roughly 2 weeks ago.  Denied increased SOB, trouble swallowing or additional sx.  Tammy, please advise. Thanks

## 2023-08-21 NOTE — Telephone Encounter (Signed)
I called and spoke with the pt's spouse and notified of response per TP  Rx sent to pharm  [facility] updated

## 2023-08-31 ENCOUNTER — Ambulatory Visit: Payer: Medicare Other | Admitting: Emergency Medicine

## 2023-08-31 ENCOUNTER — Encounter: Payer: Self-pay | Admitting: Emergency Medicine

## 2023-08-31 VITALS — BP 122/82 | HR 79 | Temp 98.3°F | Resp 16 | Ht 71.0 in | Wt 219.2 lb

## 2023-08-31 DIAGNOSIS — J440 Chronic obstructive pulmonary disease with acute lower respiratory infection: Secondary | ICD-10-CM

## 2023-08-31 DIAGNOSIS — J309 Allergic rhinitis, unspecified: Secondary | ICD-10-CM

## 2023-08-31 DIAGNOSIS — R918 Other nonspecific abnormal finding of lung field: Secondary | ICD-10-CM | POA: Diagnosis not present

## 2023-08-31 DIAGNOSIS — J209 Acute bronchitis, unspecified: Secondary | ICD-10-CM

## 2023-08-31 MED ORDER — STIOLTO RESPIMAT 2.5-2.5 MCG/ACT IN AERS: 2.0000 | INHALATION_SPRAY | Freq: Every day | RESPIRATORY_TRACT | 11 refills | Status: DC

## 2023-08-31 NOTE — Assessment & Plan Note (Signed)
Following on lung cancer screening CT chest.  I reviewed his scan from 08/15/2023 and do not see any new concerning findings.  The final report is still pending.  He will need a repeat lung cancer screening CT chest in November 2025.

## 2023-08-31 NOTE — Assessment & Plan Note (Signed)
Significant nasal congestion, seems to benefit from Teczem nasal spray but he only takes it as needed.  I encouraged him to try taking it every day to see if he gets more consistent control

## 2023-08-31 NOTE — Progress Notes (Signed)
Subjective:    Patient ID: Todd Mendoza, male    DOB: 1951/09/01, 72 y.o.   MRN: [id number]  HPI  ROV 08/31/2023 --follow-up visit for Todd Mendoza who is 43, former smoker (30 pack years) with hypertension, hyperlipidemia, cerebrovascular disease and CVA.  We have been following him for COPD.  He was last seen in our office in November at which time he was dealing with a slow to resolve acute exacerbation.  Treated with prednisone taper and Augmentin as well as Mucinex DM.  He has had difficulty tolerating maintenance therapy due to side effects, sounds like this may have been oral candidiasis.  He has been tried on both Veterinary surgeon but reports that he had lip irritation, some white plaquing in the mouth - improved with fluconazole  He is using albuterol several times a day. The cough anmd congestion have resolved. He has nasal congestion, uses flonase prn.   He has scattered pulmonary nodular disease on lung cancer screening CT scan of the chest some of which are calcified with a benign appearance.  This was repeated on 08/15/2023  Lung cancer screening CT scan of the chest 08/15/2023 reviewed by me, shows some subtle peripheral right greater than left lower lobe groundglass/interstitial change, no new nodules or concerning findings.  The official read is still pending.   Review of Systems As per HPI  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   Hyperlipidemia    Hypertension    Stroke (HCC)    Vertigo      Family History  Problem Relation Age of Onset   Diabetes Father    Colon cancer Neg Hx    Colon polyps Neg Hx    Esophageal cancer Neg Hx    Stomach cancer Neg Hx    Rectal cancer Neg Hx      Social History   Socioeconomic History   Marital status: Married    Spouse name: Not on file   Number of children: Not on file   Years of  education: Not on file   Highest education level: Not on file  Occupational History   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[employer]  Tobacco Use   Smoking status: Former    Current packs/day: 0.00    Average packs/day: 0.2 packs/day for 43.0 years (8.6 ttl pk-yrs)    Types: Cigarettes    Start date: 57    Quit date: 2009    Years since quitting: 15.9    Passive exposure: Past   Smokeless tobacco: Never  Vaping Use   Vaping status: Never Used  Substance and Sexual Activity   Alcohol use: No    Alcohol/week: 0.0 standard drinks of alcohol   Drug use: Not Currently    Types: Cocaine    Comment: 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  Other Topics Concern   Not on file  Social History Narrative   Married   Research officer, political party daily   Social Determinants of Health   Financial Resource Strain: Low Risk  (06/19/2021)   Overall Financial Resource Strain (CARDIA)    Difficulty of Paying Living Expenses: Not hard at all  Food Insecurity: No Food Insecurity (10/30/2022)   Hunger Vital Sign    Worried  About Running Out of Food in the Last Year: Never true    Ran Out of Food in the Last Year: Never true  Transportation Needs: No Transportation Needs (12/14/2022)   PRAPARE - Administrator, Civil Service (Medical): No    Lack of Transportation (Non-Medical): No  Physical Activity: Sufficiently Active (06/19/2021)   Exercise Vital Sign    Days of Exercise per Week: 4 days    Minutes of Exercise per Session: 40 min  Stress: No Stress Concern Present (03/25/2020)   [employer] of Occupational Health - Occupational Stress Questionnaire    Feeling of Stress : Not at all  Social Connections: Moderately Integrated (06/19/2021)   Social Connection and Isolation Panel [NHANES]    Frequency of Communication with Friends and Family: Three times a week    Frequency of Social Gatherings with Friends and Family: Three times a week    Attends Religious  Services: More than 4 times per year    Active Member of Clubs or Organizations: No    Attends Banker Meetings: Never    Marital Status: Married  Catering manager Violence: Not At Risk (10/24/2022)   Humiliation, Afraid, Rape, and Kick questionnaire    Fear of Current or Ex-Partner: No    Emotionally Abused: No    Physically Abused: No    Sexually Abused: No    From [place], has lived in [place] and [place] Was in the [employer]  Has worked Curator, Estate manager/land agent, CNA  No Known Allergies   Outpatient Medications Prior to Visit  Medication Sig Dispense Refill   albuterol (VENTOLIN HFA) 108 (90 Base) MCG/ACT inhaler Inhale 2 puffs into the lungs every 6 (six) hours as needed for wheezing or shortness of breath. 8.5 each 2   amLODipine (NORVASC) 10 MG tablet Take 1 tablet (10 mg total) by mouth daily. 90 tablet 1   aspirin EC 81 MG tablet Take 1 tablet (81 mg total) by mouth daily. 30 tablet 0   atorvastatin (LIPITOR) 40 MG tablet Take 1 tablet (40 mg total) by mouth daily. 90 tablet 3   clotrimazole (MYCELEX) 10 MG troche Take 1 tablet (10 mg total) by mouth 5 (five) times daily. 35 Troche 0   clotrimazole (MYCELEX) 10 MG troche Take 1 tablet (10 mg total) by mouth 5 (five) times daily. 35 Troche 0   fenofibrate (TRICOR) 145 MG tablet TAKE 1 TABLET BY MOUTH EVERY DAY 90 tablet 1   finasteride (PROSCAR) 5 MG tablet TAKE 1 TABLET (5 MG TOTAL) BY MOUTH DAILY. 90 tablet 1   fluticasone (FLONASE) 50 MCG/ACT nasal spray Place 2 sprays into both nostrils daily. 18.2 mL 2   gabapentin (NEURONTIN) 300 MG capsule Take 1 capsule (300 mg total) by mouth 3 (three) times daily. 90 capsule 1   hydrochlorothiazide (HYDRODIURIL) 25 MG tablet Take 1 tablet (25 mg total) by mouth daily. 90 tablet 3   levocetirizine (XYZAL) 5 MG tablet Take 1 tablet (5 mg total) by mouth daily as needed for allergies. 30 tablet 5   methocarbamol (ROBAXIN) 500 MG tablet Take 1 tablet (500 mg total) by mouth 4 (four) times daily. 40  tablet 1   Multiple Vitamins-Minerals (MULTIVITAMIN WITH MINERALS) tablet Take 1 tablet by mouth daily. 30 tablet 5   pantoprazole (PROTONIX) 40 MG tablet Take 1 tablet (40 mg total) by mouth 2 (two) times daily. 180 tablet 3   silodosin (RAPAFLO) 8 MG CAPS capsule Take 8 mg by mouth daily.  amoxicillin-clavulanate (AUGMENTIN) 875-125 MG tablet Take 1 tablet by mouth 2 (two) times daily. (Patient not taking: Reported on 08/31/2023) 14 tablet 0   COMIRNATY syringe  (Patient not taking: Reported on 08/31/2023)     predniSONE (DELTASONE) 10 MG tablet 4 tabs for 2 days, then 3 tabs for 2 days, 2 tabs for 2 days, then 1 tab for 2 days, then stop (Patient not taking: Reported on 08/31/2023) 20 tablet 0   promethazine-dextromethorphan (PROMETHAZINE-DM) 6.25-15 MG/5ML syrup Take 5 mLs by mouth 4 (four) times daily as needed for cough. (Patient not taking: Reported on 08/31/2023) 180 mL 0   BREO ELLIPTA 200-25 MCG/ACT AEPB  (Patient not taking: Reported on 08/31/2023)     Fluticasone-Umeclidin-Vilant (TRELEGY ELLIPTA) 100-62.5-25 MCG/ACT AEPB Inhale 1 puff into the lungs daily. (Patient not taking: Reported on 08/31/2023) 60 each 11   No facility-administered medications prior to visit.         Objective:   Physical Exam Vitals:   08/31/23 0930  BP: 122/82  Pulse: 79  Resp: 16  Temp: 98.3 F (36.8 C)  TempSrc: Oral  SpO2: 97%  Weight: 219 lb 3.2 oz (99.4 kg)  Height: 5\' 11"  (1.803 m)   Gen: Pleasant, well-nourished, in no distress,  normal affect  ENT: No lesions,  mouth clear,  oropharynx clear, no postnasal drip  Neck: No JVD, no stridor  Lungs: No use of accessory muscles, no crackles or wheezing on normal respiration, no wheeze on forced expiration  Cardiovascular: RRR, heart sounds normal, no murmur or gallops, no peripheral edema  Musculoskeletal: No deformities, no cyanosis or clubbing  Neuro: alert, awake, non focal  Skin: Warm, no lesions or rash       Assessment &  Plan:  Acute bronchitis with COPD (HCC) He has a significant bronchodilator response but has not tolerated ICS due to oral candidiasis and side effects.  I will try changing him over to Stiolto to see if he can tolerate.  Keep his albuterol available to use if needed  Allergic rhinitis Significant nasal congestion, seems to benefit from Teczem nasal spray but he only takes it as needed.  I encouraged him to try taking it every day to see if he gets more consistent control  Pulmonary nodules Following on lung cancer screening CT chest.  I reviewed his scan from 08/15/2023 and do not see any new concerning findings.  The final report is still pending.  He will need a repeat lung cancer screening CT chest in November 2025.    Levy Pupa, MD, PhD 08/31/2023, 9:56 AM  Pulmonary and Critical Care [phone] or if no answer before 7:00PM call [phone] For any issues after 7:00PM please call eLink [phone]

## 2023-08-31 NOTE — Assessment & Plan Note (Signed)
He has a significant bronchodilator response but has not tolerated ICS due to oral candidiasis and side effects.  I will try changing him over to Stiolto to see if he can tolerate.  Keep his albuterol available to use if needed

## 2023-08-31 NOTE — Patient Instructions (Addendum)
We will try starting Stiolto 2 puffs once daily.  Please call our office if you have mouth irritation or any other side effects from the medication. Okay to keep your albuterol available to use 2 puffs when you needed for shortness of breath, chest tightness, wheezing. Your screening CT scan of the chest is stable compared with priors.  You will need a repeat scan in November 2025. Okay to continue your fluticasone nasal spray, 2 sprays each nostril.  You might want to consider taking this every day on a schedule since you are having persistent nasal congestion. Follow-up with APP in 6 months Follow Dr. Delton Coombes in 1 year.  Please call sooner if you have any problems.

## 2023-09-04 ENCOUNTER — Telehealth: Payer: Self-pay | Admitting: Emergency Medicine

## 2023-09-04 MED ORDER — SPIRIVA RESPIMAT 2.5 MCG/ACT IN AERS: 2.0000 | INHALATION_SPRAY | Freq: Every day | RESPIRATORY_TRACT | 11 refills | Status: DC

## 2023-09-04 NOTE — Telephone Encounter (Signed)
Called and spoke with pt about the new inhaler Spirivia, pt verbalized understanding nfn

## 2023-09-04 NOTE — Telephone Encounter (Signed)
Dr.Byrum what alternative would you recommend

## 2023-09-04 NOTE — Telephone Encounter (Signed)
Try Spiriva respimat 2 puffs, may be more affordable.

## 2023-09-04 NOTE — Telephone Encounter (Signed)
[facility] [address]  PT's wife said inhaler was 81.00 and Dr. Delton Coombes, at his appt today, said to call us if it was and he would call in an alternative.   Sending [facility] because Dr. Just met with this PT 12/6.

## 2023-09-05 ENCOUNTER — Telehealth: Payer: Self-pay | Admitting: Emergency Medicine

## 2023-09-05 NOTE — Telephone Encounter (Signed)
PT's wife calling. See last signed encounter about RX being too much $407.17 (First one was 81.00) . The new RX we sent in is much more than the first. Pt calling for something more affordable once again if possible. Please call Mrs. To advise @ [phone]

## 2023-09-06 ENCOUNTER — Other Ambulatory Visit (HOSPITAL_COMMUNITY): Payer: Self-pay

## 2023-09-06 DIAGNOSIS — R3912 Poor urinary stream: Secondary | ICD-10-CM | POA: Diagnosis not present

## 2023-09-06 NOTE — Telephone Encounter (Signed)
LM for PT about last reply from Pharm. Please see if we can do Wixela or leave him Spiriva samples.

## 2023-09-06 NOTE — Telephone Encounter (Signed)
Patient is currently in the coverage gap. This is going to cause some co-pays to be high.  Triple Therapy Breztri - $162.28 Trelegy - $165.43  ICS+LABA Advair Diskus - Brand $39.38   Generic $20.63 Advair HFA - Brand $80.31   Generic non-formulary Wixela - $20.63 Breo Ellipta - $102.50 Dulera - $86.27 Symbicort - Brand $59.05   Generic non-formulary Breyna - non-formulary  LAMA Incruse - $89.16 Spiriva Handihaler - Brand $128.84   Generic non-formulary Spiriva Respimat - $132.70 Tudorza - non-formulary  LABA+LAMA  Anoro - $119.50 Stiolto - too soon to fill (filled 12.6.24) Bevespi - $108.66  LABA Striverdi - non-formulary  ICS Alvesco - non-formulary Arnuity Ellipta - $52.57 Asmanex HFA - non-formulary Fluticasone HFA - non-formulary (Brand no longer made) Pulmicort Flexhaler - non-formulary Qvar Redihaler - $53.88

## 2023-09-06 NOTE — Telephone Encounter (Signed)
Pt wife calling in a second time hoping to find a cheaper inhaler

## 2023-09-10 ENCOUNTER — Other Ambulatory Visit: Payer: Self-pay

## 2023-09-10 ENCOUNTER — Other Ambulatory Visit: Payer: Self-pay | Admitting: Adult Health

## 2023-09-10 DIAGNOSIS — R1900 Intra-abdominal and pelvic swelling, mass and lump, unspecified site: Secondary | ICD-10-CM

## 2023-09-10 DIAGNOSIS — R748 Abnormal levels of other serum enzymes: Secondary | ICD-10-CM

## 2023-09-10 MED ORDER — ATORVASTATIN CALCIUM 80 MG PO TABS: 80.0000 mg | ORAL_TABLET | Freq: Every day | ORAL | 3 refills | Status: AC

## 2023-09-11 ENCOUNTER — Other Ambulatory Visit: Payer: Self-pay | Admitting: Adult Health

## 2023-09-11 DIAGNOSIS — R1900 Intra-abdominal and pelvic swelling, mass and lump, unspecified site: Secondary | ICD-10-CM

## 2023-09-11 NOTE — Telephone Encounter (Signed)
Suzette Battiest wife is returning phone call. [name] phone number is [phone].

## 2023-09-11 NOTE — Telephone Encounter (Signed)
LVMTCB x 1

## 2023-09-12 NOTE — Telephone Encounter (Signed)
Lm for patient's wife(DPR).

## 2023-09-12 NOTE — Telephone Encounter (Signed)
Todd Mendoza wife is returning phone call. [name] phone number is [phone].

## 2023-09-12 NOTE — Telephone Encounter (Signed)
[name] checking on message for inhaler. States patient has been wheezing. Patient out of medication. [name] phone number is [phone].

## 2023-09-12 NOTE — Telephone Encounter (Signed)
Lm x1 for [name](DPR).

## 2023-09-13 NOTE — Telephone Encounter (Signed)
LMTCB

## 2023-09-13 NOTE — Telephone Encounter (Signed)
Todd Mendoza wife is returning phone call. Todd Mendoza phone number is [phone].

## 2023-09-14 MED ORDER — STIOLTO RESPIMAT 2.5-2.5 MCG/ACT IN AERS: 2.0000 | INHALATION_SPRAY | Freq: Every day | RESPIRATORY_TRACT | Status: DC

## 2023-09-14 NOTE — Telephone Encounter (Signed)
Dr Delton Coombes, please advise on inhaler for pt based on findings from pharm  I have left him samples Stiolto for now   Patient is currently in the coverage gap. This is going to cause some co-pays to be high.   Triple Therapy Breztri - $162.28 Trelegy - $165.43   ICS+LABA Advair Diskus - Brand $39.38   Generic $20.63 Advair HFA - Brand $80.31   Generic non-formulary Wixela - $20.63 Breo Ellipta - $102.50 Dulera - $86.27 Symbicort - Brand $59.05   Generic non-formulary Breyna - non-formulary   LAMA Incruse - $89.16 Spiriva Handihaler - Brand $128.84   Generic non-formulary Spiriva Respimat - $132.70 Tudorza - non-formulary   LABA+LAMA  Anoro - $119.50 Stiolto - too soon to fill (filled 12.6.24) Bevespi - $108.66   LABA Striverdi - non-formulary   ICS  Fluticasone HFA - non-formulary (Brand no longer made) Pulmicort Flexhaler - non-formulary Qvar Redihaler - $53.88

## 2023-09-20 NOTE — Telephone Encounter (Signed)
Mom now calling. States the samples given are not working and he wants to go back on Dovray.

## 2023-09-20 NOTE — Telephone Encounter (Signed)
Called pt and there was no answer-LMTCB °

## 2023-09-21 ENCOUNTER — Telehealth (HOSPITAL_BASED_OUTPATIENT_CLINIC_OR_DEPARTMENT_OTHER): Payer: Self-pay | Admitting: Pulmonary Disease

## 2023-09-21 MED ORDER — BREZTRI AEROSPHERE 160-9-4.8 MCG/ACT IN AERO: 2.0000 | INHALATION_SPRAY | Freq: Two times a day (BID) | RESPIRATORY_TRACT | 3 refills | Status: DC

## 2023-09-21 NOTE — Telephone Encounter (Signed)
[facility] Telephone After Hours  Requesting refill for Markus Daft until he can be seen on next visit in Feb 2025. Closing encounter.

## 2023-09-22 NOTE — Telephone Encounter (Signed)
Would be reasonable to try Bevespi. Better co-pay and also it does not have ICS, which he has tolerated poorly

## 2023-09-24 DIAGNOSIS — J441 Chronic obstructive pulmonary disease with (acute) exacerbation: Secondary | ICD-10-CM | POA: Diagnosis not present

## 2023-09-24 DIAGNOSIS — R051 Acute cough: Secondary | ICD-10-CM | POA: Diagnosis not present

## 2023-09-24 MED ORDER — BEVESPI AEROSPHERE 9-4.8 MCG/ACT IN AERO: 2.0000 | INHALATION_SPRAY | Freq: Two times a day (BID) | RESPIRATORY_TRACT | 11 refills | Status: DC

## 2023-09-24 NOTE — Telephone Encounter (Signed)
Wife is trying to return missed call. [phone]

## 2023-09-24 NOTE — Telephone Encounter (Signed)
Left message on VM for patient to call clinic

## 2023-09-24 NOTE — Telephone Encounter (Signed)
Leslye Peer, MD to [facility] Triage Pool      09/22/23  2:59 PM Note Would be reasonable to try Bevespi. Better co-pay and also it does not have ICS, which he has tolerated poorly     Spoke with the pt's spouse and notified of response per [place]  She verbalized understanding  Rx for Bevespi was sent to pharm  I moved his appt up based per his request  Nothing further needed

## 2023-10-03 ENCOUNTER — Other Ambulatory Visit: Payer: Medicare Other

## 2023-10-05 ENCOUNTER — Other Ambulatory Visit: Payer: Self-pay

## 2023-10-05 DIAGNOSIS — R748 Abnormal levels of other serum enzymes: Secondary | ICD-10-CM

## 2023-10-08 ENCOUNTER — Other Ambulatory Visit: Payer: Medicare Other

## 2023-10-08 DIAGNOSIS — R748 Abnormal levels of other serum enzymes: Secondary | ICD-10-CM | POA: Diagnosis not present

## 2023-10-09 LAB — HEPATIC FUNCTION PANEL
AG Ratio: 1.5 (calc) (ref 1.0–2.5)
ALT: 17 U/L (ref 9–46)
AST: 21 U/L (ref 10–35)
Albumin: 4.2 g/dL (ref 3.6–5.1)
Alkaline phosphatase (APISO): 54 U/L (ref 35–144)
Bilirubin, Direct: 0.1 mg/dL (ref 0.0–0.2)
Globulin: 2.8 g/dL (ref 1.9–3.7)
Indirect Bilirubin: 0.3 mg/dL (ref 0.2–1.2)
Total Bilirubin: 0.4 mg/dL (ref 0.2–1.2)
Total Protein: 7 g/dL (ref 6.1–8.1)

## 2023-10-18 ENCOUNTER — Encounter: Payer: Self-pay | Admitting: Emergency Medicine

## 2023-10-18 ENCOUNTER — Ambulatory Visit: Payer: Medicare Other | Admitting: Emergency Medicine

## 2023-10-18 VITALS — BP 145/72 | HR 81 | Ht 71.0 in | Wt 217.2 lb

## 2023-10-18 DIAGNOSIS — Z720 Tobacco use: Secondary | ICD-10-CM

## 2023-10-18 DIAGNOSIS — J449 Chronic obstructive pulmonary disease, unspecified: Secondary | ICD-10-CM | POA: Diagnosis not present

## 2023-10-18 NOTE — Assessment & Plan Note (Signed)
Your lung cancer screening CT chest will be done in November 2025.

## 2023-10-18 NOTE — Assessment & Plan Note (Signed)
Discussed your inhalers today. Try going back on Breztri 2 puffs twice a day.  Remember to rinse and gargle well after you take it. If the [facility] causes you to have mouth irritation then we will stop it and switch to Bevespi 2 puffs twice a day. Keep your albuterol available to use 2 puffs if you needed for shortness of breath, chest tightness, wheezing. Follow with APP in 3 months so we can assess your status on your maintenance inhaler.

## 2023-10-18 NOTE — Progress Notes (Signed)
Subjective:    Patient ID: Todd Mendoza, male    DOB: Oct 06, 1950, 73 y.o.   MRN: [id number]  HPI  ROV 08/31/2023 --follow-up visit for Todd Mendoza who is 73, former smoker (30 pack years) with hypertension, hyperlipidemia, cerebrovascular disease and CVA.  We have been following him for COPD.  He was last seen in our office in November at which time he was dealing with a slow to resolve acute exacerbation.  Treated with prednisone taper and Augmentin as well as Mucinex DM.  He has had difficulty tolerating maintenance therapy due to side effects, sounds like this may have been oral candidiasis.  He has been tried on both Veterinary surgeon but reports that he had lip irritation, some white plaquing in the mouth - improved with fluconazole  He is using albuterol several times a day. The cough anmd congestion have resolved. He has nasal congestion, uses flonase prn.   He has scattered pulmonary nodular disease on lung cancer screening CT scan of the chest some of which are calcified with a benign appearance.  This was repeated on 08/15/2023  Lung cancer screening CT scan of the chest 08/15/2023 reviewed by me, shows some subtle peripheral right greater than left lower lobe groundglass/interstitial change, no new nodules or concerning findings.  The official read is still pending.  ROV 10/18/23 --73 year old man with COPD, hypertension, hyperlipidemia, history of CVA.  He also has scattered pulmonary nodular disease on lung cancer screening.  He has difficulty with mouth irritation and thrush on an ICS so I changed him to [facility] in December to see if he would better tolerate, had to substitute Bevespi for cost.  He has rhinitis and is on fluticasone nasal spray.  He will be due for a repeat lung cancer screening CT chest in November 2025. Today he reports that he has some increased cough and congestion 3 weeks ago, was treated w levaquin and improved.    Review of Systems As per HPI  Past Medical  History:  Diagnosis Date   Arthritis    Asthma    Hepatitis    treated for Hep C in the past   History of substance abuse (HCC)    cocaine- over 20 years ago   Hyperlipidemia    Hypertension    Stroke (HCC)    Vertigo      Family History  Problem Relation Age of Onset   Diabetes Father    Colon cancer Neg Hx    Colon polyps Neg Hx    Esophageal cancer Neg Hx    Stomach cancer Neg Hx    Rectal cancer Neg Hx      Social History   Socioeconomic History   Marital status: Married    Spouse name: Not on file   Number of children: Not on file   Years of education: Not on file   Highest education level: Not on file  Occupational History   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[employer]  Tobacco Use   Smoking status: Former    Current packs/day: 0.00    Average packs/day: 0.2 packs/day for 43.0 years (8.6 ttl pk-yrs)    Types: Cigarettes    Start date: 32    Quit date: 2009    Years since quitting: 16.0    Passive exposure: Past   Smokeless tobacco: Never  Vaping Use   Vaping status: Never Used  Substance and Sexual Activity   Alcohol use: No    Alcohol/week: 0.0 standard drinks of alcohol  Drug use: Not Currently    Types: Cocaine    Comment: 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  Other Topics Concern   Not on file  Social History Narrative   Married   Research officer, political party daily   Social Drivers of Health   Financial Resource Strain: Low Risk  (06/19/2021)   Overall Financial Resource Strain (CARDIA)    Difficulty of Paying Living Expenses: Not hard at all  Food Insecurity: No Food Insecurity (10/30/2022)   Hunger Vital Sign    Worried About Running Out of Food in the Last Year: Never true    Ran Out of Food in the Last Year: Never true  Transportation Needs: No Transportation Needs (12/14/2022)   PRAPARE - Administrator, Civil Service (Medical): No    Lack of Transportation (Non-Medical): No  Physical Activity:  Sufficiently Active (06/19/2021)   Exercise Vital Sign    Days of Exercise per Week: 4 days    Minutes of Exercise per Session: 40 min  Stress: No Stress Concern Present (03/25/2020)   [employer] of Occupational Health - Occupational Stress Questionnaire    Feeling of Stress : Not at all  Social Connections: Moderately Integrated (06/19/2021)   Social Connection and Isolation Panel [NHANES]    Frequency of Communication with Friends and Family: Three times a week    Frequency of Social Gatherings with Friends and Family: Three times a week    Attends Religious Services: More than 4 times per year    Active Member of Clubs or Organizations: No    Attends Banker Meetings: Never    Marital Status: Married  Catering manager Violence: Not At Risk (10/24/2022)   Humiliation, Afraid, Rape, and Kick questionnaire    Fear of Current or Ex-Partner: No    Emotionally Abused: No    Physically Abused: No    Sexually Abused: No    From [place], has lived in [place] and [place] Was in the [employer]  Has worked Curator, Estate manager/land agent, CNA  No Known Allergies   Outpatient Medications Prior to Visit  Medication Sig Dispense Refill   albuterol (VENTOLIN HFA) 108 (90 Base) MCG/ACT inhaler Inhale 2 puffs into the lungs every 6 (six) hours as needed for wheezing or shortness of breath. 8.5 each 2   amLODipine (NORVASC) 10 MG tablet Take 1 tablet (10 mg total) by mouth daily. 90 tablet 1   amoxicillin-clavulanate (AUGMENTIN) 875-125 MG tablet Take 1 tablet by mouth 2 (two) times daily. 14 tablet 0   aspirin EC 81 MG tablet Take 1 tablet (81 mg total) by mouth daily. 30 tablet 0   atorvastatin (LIPITOR) 80 MG tablet Take 1 tablet (80 mg total) by mouth daily. 90 tablet 3   Budeson-Glycopyrrol-Formoterol (BREZTRI AEROSPHERE) 160-9-4.8 MCG/ACT AERO Inhale 2 puffs into the lungs in the morning and at bedtime. 10.7 g 3   clotrimazole (MYCELEX) 10 MG troche Take 1 tablet (10 mg total) by mouth 5 (five) times  daily. 35 Troche 0   clotrimazole (MYCELEX) 10 MG troche Take 1 tablet (10 mg total) by mouth 5 (five) times daily. 35 Troche 0   COMIRNATY syringe      fenofibrate (TRICOR) 145 MG tablet TAKE 1 TABLET BY MOUTH EVERY DAY 90 tablet 1   finasteride (PROSCAR) 5 MG tablet TAKE 1 TABLET (5 MG TOTAL) BY MOUTH DAILY. 90 tablet 1   fluticasone (FLONASE) 50 MCG/ACT nasal spray  Place 2 sprays into both nostrils daily. 18.2 mL 2   gabapentin (NEURONTIN) 300 MG capsule Take 1 capsule (300 mg total) by mouth 3 (three) times daily. 90 capsule 1   Glycopyrrolate-Formoterol (BEVESPI AEROSPHERE) 9-4.8 MCG/ACT AERO Inhale 2 puffs into the lungs 2 (two) times daily. 10.7 g 11   hydrochlorothiazide (HYDRODIURIL) 25 MG tablet Take 1 tablet (25 mg total) by mouth daily. 90 tablet 3   levocetirizine (XYZAL) 5 MG tablet Take 1 tablet (5 mg total) by mouth daily as needed for allergies. 30 tablet 5   methocarbamol (ROBAXIN) 500 MG tablet Take 1 tablet (500 mg total) by mouth 4 (four) times daily. 40 tablet 1   Multiple Vitamins-Minerals (MULTIVITAMIN WITH MINERALS) tablet Take 1 tablet by mouth daily. 30 tablet 5   pantoprazole (PROTONIX) 40 MG tablet Take 1 tablet (40 mg total) by mouth 2 (two) times daily. 180 tablet 3   predniSONE (DELTASONE) 10 MG tablet 4 tabs for 2 days, then 3 tabs for 2 days, 2 tabs for 2 days, then 1 tab for 2 days, then stop 20 tablet 0   promethazine-dextromethorphan (PROMETHAZINE-DM) 6.25-15 MG/5ML syrup Take 5 mLs by mouth 4 (four) times daily as needed for cough. 180 mL 0   silodosin (RAPAFLO) 8 MG CAPS capsule Take 8 mg by mouth daily.     No facility-administered medications prior to visit.         Objective:   Physical Exam Vitals:   10/18/23 1506  BP: (!) 145/72  Pulse: 81  SpO2: 98%  Weight: 217 lb 3.2 oz (98.5 kg)  Height: 5\' 11"  (1.803 m)   Gen: Pleasant, well-nourished, in no distress,  normal affect  ENT: No lesions,  mouth clear,  oropharynx clear, no postnasal  drip  Neck: No JVD, no stridor  Lungs: No use of accessory muscles, no crackles or wheezing on normal respiration, no wheeze on forced expiration  Cardiovascular: RRR, heart sounds normal, no murmur or gallops, no peripheral edema  Musculoskeletal: No deformities, no cyanosis or clubbing  Neuro: alert, awake, non focal  Skin: Warm, no lesions or rash       Assessment & Plan:  COPD (chronic obstructive pulmonary disease) (HCC) Discussed your inhalers today. Try going back on Breztri 2 puffs twice a day.  Remember to rinse and gargle well after you take it. If the [facility] causes you to have mouth irritation then we will stop it and switch to Bevespi 2 puffs twice a day. Keep your albuterol available to use 2 puffs if you needed for shortness of breath, chest tightness, wheezing. Follow with APP in 3 months so we can assess your status on your maintenance inhaler.  Tobacco use Your lung cancer screening CT chest will be done in November 2025.     Levy Pupa, MD, PhD 10/18/2023, 7:56 PM [facility] [phone] or if no answer before 7:00PM call [phone] For any issues after 7:00PM please call eLink [phone]

## 2023-10-18 NOTE — Patient Instructions (Signed)
Discussed your inhalers today. Try going back on Breztri 2 puffs twice a day.  Remember to rinse and gargle well after you take it. If the [facility] causes you to have mouth irritation then we will stop it and switch to Bevespi 2 puffs twice a day. Keep your albuterol available to use 2 puffs if you needed for shortness of breath, chest tightness, wheezing. Your lung cancer screening CT chest will be done in November 2025. Follow with APP in 3 months so we can assess your status on your maintenance inhaler.

## 2023-10-24 ENCOUNTER — Telehealth: Payer: Self-pay | Admitting: Emergency Medicine

## 2023-10-24 NOTE — Telephone Encounter (Signed)
Patient needs refill of Breztri  Pharmacy: [facility] on [address]

## 2023-10-25 MED ORDER — BREZTRI AEROSPHERE 160-9-4.8 MCG/ACT IN AERO: 2.0000 | INHALATION_SPRAY | Freq: Two times a day (BID) | RESPIRATORY_TRACT | 3 refills | Status: DC

## 2023-10-25 NOTE — Telephone Encounter (Signed)
ATC X1. LVM informing pt refills has been sent in. NFN

## 2023-10-28 ENCOUNTER — Ambulatory Visit
Admission: RE | Admit: 2023-10-28 | Discharge: 2023-10-28 | Disposition: A | Discharge status: Home / Self Care | Payer: Medicare Other | Source: Ambulatory Visit | Attending: Adult Health | Admitting: Adult Health

## 2023-10-28 DIAGNOSIS — R1900 Intra-abdominal and pelvic swelling, mass and lump, unspecified site: Secondary | ICD-10-CM

## 2023-10-28 DIAGNOSIS — K7689 Other specified diseases of liver: Secondary | ICD-10-CM | POA: Diagnosis not present

## 2023-10-28 MED ORDER — GADOPICLENOL 0.5 MMOL/ML IV SOLN
9.0000 mL | Freq: Once | INTRAVENOUS | Status: AC | PRN
  Administered 2023-10-28: 9 mL via INTRAVENOUS

## 2023-10-31 NOTE — Telephone Encounter (Signed)
 Okay with me for him to switch back to Trelegy

## 2023-10-31 NOTE — Telephone Encounter (Signed)
 Spoke to patient. He stated that he can not afford breztri . He would like to switch back to trelegy.  Dr. Baldwin Levee, please advise. Thanks

## 2023-11-01 MED ORDER — TRELEGY ELLIPTA 100-62.5-25 MCG/ACT IN AEPB: 1.0000 | INHALATION_SPRAY | Freq: Every day | RESPIRATORY_TRACT | 5 refills | Status: AC

## 2023-11-01 NOTE — Telephone Encounter (Signed)
 Trelegy has been sent to preferred pharmacy.  Pt is aware and voiced his understanding.  Nothing further needed.

## 2023-11-07 ENCOUNTER — Encounter: Payer: Self-pay | Admitting: Family

## 2023-11-07 ENCOUNTER — Ambulatory Visit (INDEPENDENT_AMBULATORY_CARE_PROVIDER_SITE_OTHER): Payer: Medicare Other | Admitting: Family

## 2023-11-07 VITALS — BP 118/80 | HR 83 | Temp 99.0°F | Resp 18 | Ht 71.0 in | Wt 215.8 lb

## 2023-11-07 DIAGNOSIS — R1900 Intra-abdominal and pelvic swelling, mass and lump, unspecified site: Secondary | ICD-10-CM

## 2023-11-07 DIAGNOSIS — R160 Hepatomegaly, not elsewhere classified: Secondary | ICD-10-CM

## 2023-11-07 DIAGNOSIS — R109 Unspecified abdominal pain: Secondary | ICD-10-CM

## 2023-11-07 DIAGNOSIS — R140 Abdominal distension (gaseous): Secondary | ICD-10-CM | POA: Diagnosis not present

## 2023-11-07 NOTE — Progress Notes (Signed)
Provider: Richarda Blade FNP-C  Kevona Lupinacci, Donalee Citrin, NP  Patient Care Team: Asja Frommer, Donalee Citrin, NP as PCP - General (Family Medicine) Jadene Pierini, MD as Consulting Physician (Neurosurgery)  Extended Emergency Contact Information Primary Emergency Contact: Todd Mendoza Address: [address]          [place], [place] [address] D[place] of [place] Mobile Phone: [phone] Relation: Spouse  Code Status:  Full Code  Goals of care: Advanced Directive information    11/07/2023    3:09 PM  Advanced Directives  Does Patient Have a Medical Advance Directive? No  Would patient like information on creating a medical advance directive? No - Patient declined     Chief Complaint  Patient presents with   Abdominal Pain    Abdominal issues. Discuss the need for covid.     History of Present Illness   Todd Mendoza is a 73 year old male who presents with abdominal pain and bloating. His Abdominal pain MRI results was evaluated by Yolanda Bonine, NP  was advised to follow up today to discuss results today.of note,his low dose lung cancer CT scan done 08/15/2023 showed Mildly hypodense mass in the inferior right hepatic lobe, new or enlarged from 10/24/2022. MR abdomen without and with contrast was  recommended   He has been experiencing persistent abdominal bloating and intermittent severe, cruciate pain in his left side for an unspecified duration. The pain lasts for a day before subsiding and recurs after a few days. Initially thought to be a pulled muscle, the pain has persisted. No associated symptoms such as nausea, vomiting, diarrhea, constipation, belching, or excessive gas. His appetite remains unaffected during episodes of pain, and there is no fever or chills.  An MRI of the abdomen performed in January 2024 revealed 2.6 cm heterogeneously enhancing mass in segment 6 of the right hepatic lobe, has nonspecific characteristics.  He was advised to follow up today to  discuss MRI of the abdomen results.  He has a history of smoking but denies any alcohol use.    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   Hyperlipidemia    Hypertension    Stroke [facility])    Vertigo    Past Surgical History:  Procedure Laterality Date   BIOPSY  10/26/2022   Procedure: BIOPSY;  Surgeon: Beverley Fiedler, MD;  Location: [facility] ENDOSCOPY;  Service: Gastroenterology;;   COLONOSCOPY     ESOPHAGOGASTRODUODENOSCOPY (EGD) WITH PROPOFOL N/A 10/26/2022   Procedure: ESOPHAGOGASTRODUODENOSCOPY (EGD) WITH PROPOFOL;  Surgeon: Beverley Fiedler, MD;  Location: [facility] ENDOSCOPY;  Service: Gastroenterology;  Laterality: N/A;   LAMINECTOMY WITH POSTERIOR LATERAL ARTHRODESIS LEVEL 4 N/A 10/19/2022   Procedure: Lumbar one to Sacral one Laminectomies with Lumbar one to Sacral one Posterolateral instrumented fusion;  Surgeon: Jadene Pierini, MD;  Location: [facility] OR;  Service: Neurosurgery;  Laterality: N/A;   MULTIPLE TOOTH EXTRACTIONS     NO PAST SURGERIES      No Known Allergies  Outpatient Encounter Medications as of 11/07/2023  Medication Sig   albuterol (VENTOLIN HFA) 108 (90 Base) MCG/ACT inhaler Inhale 2 puffs into the lungs every 6 (six) hours as needed for wheezing or shortness of breath.   amLODipine (NORVASC) 10 MG tablet Take 1 tablet (10 mg total) by mouth daily.   aspirin  EC 81 MG tablet Take 1 tablet (81 mg total) by mouth daily.   atorvastatin (LIPITOR) 80 MG tablet Take 1 tablet (80 mg total) by mouth daily.   COMIRNATY syringe    fenofibrate (TRICOR) 145 MG tablet TAKE 1 TABLET BY MOUTH EVERY DAY   finasteride (PROSCAR) 5 MG tablet TAKE 1 TABLET (5 MG TOTAL) BY MOUTH DAILY.   fluticasone (FLONASE) 50 MCG/ACT nasal spray Place 2 sprays into both nostrils daily.   Fluticasone-Umeclidin-Vilant (TRELEGY ELLIPTA) 100-62.5-25 MCG/ACT AEPB Inhale 1 puff into  the lungs daily.   gabapentin (NEURONTIN) 300 MG capsule Take 1 capsule (300 mg total) by mouth 3 (three) times daily.   hydrochlorothiazide (HYDRODIURIL) 25 MG tablet Take 1 tablet (25 mg total) by mouth daily.   Multiple Vitamins-Minerals (MULTIVITAMIN WITH MINERALS) tablet Take 1 tablet by mouth daily.   pantoprazole (PROTONIX) 40 MG tablet Take 1 tablet (40 mg total) by mouth 2 (two) times daily.   promethazine-dextromethorphan (PROMETHAZINE-DM) 6.25-15 MG/5ML syrup Take 5 mLs by mouth 4 (four) times daily as needed for cough.   silodosin (RAPAFLO) 8 MG CAPS capsule Take 8 mg by mouth daily.   amoxicillin-clavulanate (AUGMENTIN) 875-125 MG tablet Take 1 tablet by mouth 2 (two) times daily. (Patient not taking: Reported on 11/07/2023)   clotrimazole (MYCELEX) 10 MG troche Take 1 tablet (10 mg total) by mouth 5 (five) times daily. (Patient not taking: Reported on 11/07/2023)   clotrimazole (MYCELEX) 10 MG troche Take 1 tablet (10 mg total) by mouth 5 (five) times daily. (Patient not taking: Reported on 11/07/2023)   levocetirizine (XYZAL) 5 MG tablet Take 1 tablet (5 mg total) by mouth daily as needed for allergies. (Patient not taking: Reported on 11/07/2023)   methocarbamol (ROBAXIN) 500 MG tablet Take 1 tablet (500 mg total) by mouth 4 (four) times daily. (Patient not taking: Reported on 11/07/2023)   predniSONE (DELTASONE) 10 MG tablet 4 tabs for 2 days, then 3 tabs for 2 days, 2 tabs for 2 days, then 1 tab for 2 days, then stop (Patient not taking: Reported on 11/07/2023)   No facility-administered encounter medications on file as of 11/07/2023.    Review of Systems  Constitutional:  Negative for appetite change, chills, fatigue, fever and unexpected weight change.  Eyes:  Negative for pain, discharge, redness, itching and visual disturbance.  Respiratory:  Negative for cough, chest tightness, shortness of breath and wheezing.   Cardiovascular:  Negative for chest pain, palpitations and leg  swelling.  Gastrointestinal:  Positive for abdominal pain. Negative for abdominal distention, blood in stool, constipation, diarrhea, nausea and vomiting.       Bloating   Genitourinary:  Negative for difficulty urinating, dysuria, flank pain, frequency and urgency.  Musculoskeletal:  Negative for arthralgias, back pain, gait problem, joint swelling, myalgias, neck pain and neck stiffness.  Skin:  Negative for color change, pallor, rash and wound.  Neurological:  Negative for dizziness, weakness, light-headedness and headaches.    Immunization History  Administered Date(s) Administered   Fluad Quad(high Dose 65+) 06/24/2019, 06/21/2020   Fluad Trivalent(High Dose 65+) 05/22/2023   Influenza, High Dose Seasonal PF 09/17/2018   Influenza-Unspecified 06/01/2021, 08/10/2022   PFIZER(Purple Top)SARS-COV-2 Vaccination 11/24/2019, 12/23/2019   Pfizer(Comirnaty)Fall Seasonal Vaccine 12 years and older 05/28/2023   Pneumococcal Conjugate-13 04/11/2016   Pneumococcal Polysaccharide-23 05/15/2017   Tdap 04/02/2017   Zoster Recombinant(Shingrix) 10/18/2020, 12/20/2020   Pertinent  Health Maintenance Due  Topic Date Due   Colonoscopy  07/27/2024   INFLUENZA VACCINE  Completed      01/19/2023   10:53 AM 05/22/2023    9:18 AM 08/31/2023    9:35 AM 10/18/2023    3:08 PM 11/07/2023    3:08 PM  Fall Risk  Falls in the past year? 0 0 0 0 0  Was there an injury with Fall? 0 0 0    Fall Risk Category Calculator 0 0 0    Patient at Risk for Falls Due to History of fall(s);No Fall Risks No Fall Risks   No Fall Risks  Fall risk Follow up     Falls evaluation completed   Functional Status Survey:    Vitals:   11/07/23 1502  BP: 118/80  Pulse: 83  Resp: 18  Temp: 99 F (37.2 C)  SpO2: 97%  Weight: 215 lb 12.8 oz (97.9 kg)  Height: 5\' 11"  (1.803 m)   Body mass index is 30.1 kg/m. Physical Exam Vitals reviewed.  Constitutional:      General: He is not in acute distress.    Appearance:  Normal appearance. He is obese. He is not ill-appearing or diaphoretic.  HENT:     Head: Normocephalic.     Mouth/Throat:     Mouth: Mucous membranes are moist.     Pharynx: Oropharynx is clear. No oropharyngeal exudate or posterior oropharyngeal erythema.  Eyes:     General: No scleral icterus.       Right eye: No discharge.        Left eye: No discharge.     Conjunctiva/sclera: Conjunctivae normal.     Pupils: Pupils are equal, round, and reactive to light.  Neck:     Vascular: No carotid bruit.  Cardiovascular:     Rate and Rhythm: Normal rate and regular rhythm.     Pulses: Normal pulses.     Heart sounds: Normal heart sounds. No murmur heard.    No friction rub. No gallop.  Pulmonary:     Effort: Pulmonary effort is normal. No respiratory distress.     Breath sounds: Normal breath sounds. No wheezing, rhonchi or rales.  Chest:     Chest wall: No tenderness.  Abdominal:     General: Bowel sounds are normal. There is no distension.     Palpations: Abdomen is soft. There is no mass.     Tenderness: There is no abdominal tenderness. There is no right CVA tenderness, left CVA tenderness, guarding or rebound.  Musculoskeletal:        General: No swelling or tenderness. Normal range of motion.     Cervical back: Normal range of motion. No rigidity or tenderness.     Right lower leg: No edema.     Left lower leg: No edema.  Lymphadenopathy:     Cervical: No cervical adenopathy.  Skin:    General: Skin is warm and dry.     Coloration: Skin is not pale.     Findings: No bruising, erythema, lesion or rash.  Neurological:     Mental Status: He is alert and oriented to person, place, and time.     Cranial Nerves: No cranial nerve deficit.     Sensory: No sensory deficit.     Gait: Gait normal.  Psychiatric:        Mood and Affect: Mood normal.        Speech: Speech normal.        Behavior: Behavior normal.     Labs reviewed: Recent Labs    11/13/22 0817 05/22/23 1041  NA 142 140  K 4.5 5.1  CL 107 105  CO2 25 27  GLUCOSE 88 87  BUN 17 13  CREATININE 1.10 1.07  CALCIUM 9.5 9.9   Recent Labs    11/13/22 0817 05/22/23 1041 10/08/23 0840  AST 23 23 21   ALT 15 17 17   BILITOT 0.4 0.5 0.4  PROT 7.9 7.7 7.0   Recent Labs    11/13/22 0817 05/22/23 1041  WBC 6.8 5.5  NEUTROABS 3,108 2,486  HGB 9.1* 10.1*  HCT 28.7* 33.2*  MCV 87.5 81.2  PLT 392 364   Lab Results  Component Value Date   TSH 1.08 05/22/2023   Lab Results  Component Value Date   HGBA1C 5.4 10/13/2019   Lab Results  Component Value Date   CHOL 153 05/22/2023   HDL 40 05/22/2023   LDLCALC 94 05/22/2023   LDLDIRECT 62.0 10/09/2018   TRIG 95 05/22/2023   CHOLHDL 3.8 05/22/2023    Significant Diagnostic Results in last 30 days:  MR ABDOMEN WWO CONTRAST Result Date: 11/01/2023 CLINICAL DATA:  Liver lesion on recent lung cancer screening CT. EXAM: MRI ABDOMEN WITHOUT AND WITH CONTRAST TECHNIQUE: Multiplanar multisequence MR imaging of the abdomen was performed both before and after the administration of intravenous contrast. CONTRAST:  9 mL Vueway COMPARISON:  Lung cancer screening CT on 08/15/2023, and noncontrast abdomen CT on 10/24/2022 FINDINGS: Lower chest: No acute findings. Hepatobiliary: A single well-circumscribed mass is seen in segment 6 of the right lobe which measures 2.6 x 2.5 cm. This shows near T2 isointensity, heterogeneous hypovascular enhancement with thin peripheral rim enhancement, but no contrast washout. Mildly restricted diffusion noted. No other hepatic masses are identified. Gallbladder is unremarkable. No evidence of biliary ductal dilatation. Pancreas:  No mass or inflammatory changes. Spleen:  Within normal limits in size and appearance. Adrenals/Urinary Tract: No suspicious masses identified. No evidence of hydronephrosis. Stomach/Bowel: Unremarkable. Vascular/Lymphatic: No pathologically enlarged lymph nodes identified. No acute vascular findings.  Other:  None. Musculoskeletal:  No suspicious bone lesions identified. IMPRESSION: 2.6 cm heterogeneously enhancing mass in segment 6 of the right hepatic lobe, has nonspecific characteristics. Malignancy cannot be excluded. Recommend correlation with tumor markers, and consider tissue sampling, PET-CT, or short-term follow-up by MRI with Eovist contrast in 3 months. Electronically Signed   By: Danae Orleans M.D.   On: 11/01/2023 11:04    Assessment/Plan  Abdominal Pain Intermittent, severe abdominal pain with bloating, primarily on the left side. No associated nausea, vomiting, diarrhea, constipation, or changes in appetite. No fever or chills. Physical exam reveals no tenderness or swelling. Differential diagnosis includes IBS, gastritis, or other GI pathology. Simethicone and probiotics for bloating have been ineffective. Further evaluation by a gastroenterologist is necessary. - Order blood work including amylase and lipase - Refer to gastroenterologist for further evaluation and management - Lipase - Amylase - Ambulatory referral to Gastroenterology  Liver Mass MRI revealed a 2.6 x 2.5 cm mass in the right liver lobe. The nature of the mass (benign vs. malignant) is undetermined. No associated weight loss or systemic symptoms. History of smoking, no alcohol use. Gastroenterologist to evaluate the mass and determine if monitoring or further intervention is needed. Options may include regular monitoring, additional imaging, or potential procedures. - Refer to gastroenterologist for further evaluation and management - Monitor weight for significant changes - Ambulatory referral to Gastroenterology  General Health Maintenance Vitals: BP 118/80 mmHg, HR 83 bpm, Temp 17F, O2 sat 97%. Weight 215.8 lbs. No leg swelling. No  recent illness reported. - Recheck weight regularly - Continue monitoring liver enzymes  Follow-up - Ensure gastroenterologist referral is completed and follow-up on  appointment scheduling - Update contact information at the front desk.   Family/ staff Communication: Reviewed plan of care with patient and wife verbalized understanding   Labs/tests ordered:  - Lipase - Amylase  Next Appointment: Return if symptoms worsen or fail to improve.  Total time: 30 minutes. Greater than 50% of total time spent doing patient education,Left Abdominal pain  and discussing MRI of the abdomen health maintenance including symptom/medication management.   Todd Bookman, NP

## 2023-11-08 LAB — AMYLASE: Amylase: 44 U/L (ref 21–101)

## 2023-11-08 LAB — LIPASE: Lipase: 36 U/L (ref 7–60)

## 2023-11-09 ENCOUNTER — Other Ambulatory Visit: Payer: Self-pay | Admitting: Nurse Practitioner

## 2023-11-09 DIAGNOSIS — R058 Other specified cough: Secondary | ICD-10-CM

## 2023-11-09 DIAGNOSIS — J209 Acute bronchitis, unspecified: Secondary | ICD-10-CM

## 2023-11-19 ENCOUNTER — Ambulatory Visit: Payer: Medicare Other | Admitting: Nurse Practitioner

## 2023-11-26 ENCOUNTER — Encounter: Payer: Medicare Other | Admitting: Family

## 2023-11-27 ENCOUNTER — Telehealth: Payer: Self-pay

## 2023-11-27 NOTE — Telephone Encounter (Signed)
 Spoke with patient wife this morning in regards that she has not received a phone call about the referral that was order  for her husband abdominal and pelvic swelling, mass and lump in the liver from the  [facility] GASTRO [facility]ICE.  Patient wife was very concern because no has called them to set up an appointment. Please Advise  Message sent to Myra Rude  and Ngetich, Donalee Citrin, NP

## 2023-11-27 NOTE — Telephone Encounter (Signed)
 Noted.

## 2023-11-27 NOTE — Telephone Encounter (Signed)
 Noted. Left message on voicemail for patient to return call when available

## 2023-11-27 NOTE — Telephone Encounter (Signed)
 Sent Urgent in-basket message to [facility] URGENT REFERRAL/CONSULT POOL. Requesting review for urgency determination and scheduling.

## 2023-11-28 NOTE — Telephone Encounter (Signed)
 Noted.

## 2023-11-28 NOTE — Telephone Encounter (Addendum)
 Spoke with patient wife to give an update from [place], [place] D and patient wife agree and did let the patient know if she don't here from [facility] to give [place] a call.   Message sent to Ngetich, Donalee Citrin, NP

## 2023-11-30 DIAGNOSIS — Z114 Encounter for screening for human immunodeficiency virus [HIV]: Secondary | ICD-10-CM | POA: Diagnosis not present

## 2023-11-30 DIAGNOSIS — E669 Obesity, unspecified: Secondary | ICD-10-CM | POA: Diagnosis not present

## 2023-11-30 DIAGNOSIS — I110 Hypertensive heart disease with heart failure: Secondary | ICD-10-CM | POA: Diagnosis not present

## 2023-11-30 DIAGNOSIS — Z0189 Encounter for other specified special examinations: Secondary | ICD-10-CM | POA: Diagnosis not present

## 2023-11-30 DIAGNOSIS — J432 Centrilobular emphysema: Secondary | ICD-10-CM | POA: Diagnosis not present

## 2023-11-30 DIAGNOSIS — Z79899 Other long term (current) drug therapy: Secondary | ICD-10-CM | POA: Diagnosis not present

## 2023-11-30 DIAGNOSIS — E785 Hyperlipidemia, unspecified: Secondary | ICD-10-CM | POA: Diagnosis not present

## 2023-11-30 DIAGNOSIS — I700 Atherosclerosis of aorta: Secondary | ICD-10-CM | POA: Diagnosis not present

## 2023-11-30 DIAGNOSIS — Z125 Encounter for screening for malignant neoplasm of prostate: Secondary | ICD-10-CM | POA: Diagnosis not present

## 2023-12-02 NOTE — Progress Notes (Signed)
   This encounter was created in error - please disregard. No show

## 2023-12-03 ENCOUNTER — Ambulatory Visit: Admitting: Family

## 2023-12-17 DIAGNOSIS — I110 Hypertensive heart disease with heart failure: Secondary | ICD-10-CM | POA: Diagnosis not present

## 2023-12-17 DIAGNOSIS — E785 Hyperlipidemia, unspecified: Secondary | ICD-10-CM | POA: Diagnosis not present

## 2023-12-17 DIAGNOSIS — F1911 Other psychoactive substance abuse, in remission: Secondary | ICD-10-CM | POA: Diagnosis not present

## 2023-12-17 DIAGNOSIS — I509 Heart failure, unspecified: Secondary | ICD-10-CM | POA: Diagnosis not present

## 2023-12-17 DIAGNOSIS — I2510 Atherosclerotic heart disease of native coronary artery without angina pectoris: Secondary | ICD-10-CM | POA: Diagnosis not present

## 2023-12-17 DIAGNOSIS — J432 Centrilobular emphysema: Secondary | ICD-10-CM | POA: Diagnosis not present

## 2023-12-17 DIAGNOSIS — G3184 Mild cognitive impairment, so stated: Secondary | ICD-10-CM | POA: Diagnosis not present

## 2023-12-17 DIAGNOSIS — Z0001 Encounter for general adult medical examination with abnormal findings: Secondary | ICD-10-CM | POA: Diagnosis not present

## 2023-12-17 DIAGNOSIS — E669 Obesity, unspecified: Secondary | ICD-10-CM | POA: Diagnosis not present

## 2023-12-22 ENCOUNTER — Other Ambulatory Visit: Payer: Self-pay | Admitting: Family

## 2023-12-22 DIAGNOSIS — I10 Essential (primary) hypertension: Secondary | ICD-10-CM

## 2024-01-13 NOTE — Progress Notes (Signed)
 Todd Canard, PA-C [address] [place], [place]  [address] Phone: [phone]   Primary Care Physician: Estil Heman, NP  Primary Gastroenterologist:  Todd Canard, PA-C / Yong Henle, MD   Chief Complaint: Liver Mass     HPI:   Todd Mendoza is a 73 y.o. male is here to evaluate liver mass seen on abdominal MRI.    10/28/2023 abdominal MRI with and without contrast:  2.6 cm heterogeneously enhancing mass in segment 6 of the right hepatic lobe, has nonspecific characteristics. Malignancy cannot be excluded. Recommend correlation with tumor markers, and consider tissue sampling, PET-CT, or short-term follow-up by MRI with Eovist  contrast in 3 months.  10/08/2023: Labs showed normal hepatic panel.  Normal LFTs. 12/2023: Normal lipase and amylase.  10/26/2022 EGD by Dr. Bridgett Camps: Moderately severe esophagitis without bleeding.  Mild gastritis.  Normal duodenum.  Biopsies negative for H. pylori.  Gastric biopsies showed mild chronic gastritis and focal intestinal metaplasia.  Esophageal biopsies consistent with reflux.  No metaplasia or dysplasia.  07/2021 colonoscopy by Dr. Brice Campi: Adequate prep; 3 small (2 mm to 5 mm) tubular adenoma polyps removed.  Internal and external hemorrhoids.  Diverticulosis.  Moderate inflammation in the cecum and ileocecal valve secondary to colitis.  Biopsies not consistent with UC.  3-year repeat.  07/2018 Colonoscopy: 3 small tubular adenoma polyps removed.  05/22/2023 labs: Stable improved hemoglobin 10.1, MCV 81.  He has chronic anemia.  Not currently taking iron or vitamin B12.  He states he took B12 in the past.  He denies any current symptoms.  Has History of GERD which is controlled on Protonix  40 mg once daily.  Denies dysphagia or breakthrough heartburn.  He denies abdominal pain, nausea, vomiting, weight loss, hematochezia, or melena.  Has occasional mild irregular bowel movements.  Typically has normal bowel movement  daily.  Current Outpatient Medications  Medication Sig Dispense Refill   albuterol  (VENTOLIN  HFA) 108 (90 Base) MCG/ACT inhaler Inhale 2 puffs into the lungs every 6 (six) hours as needed for wheezing or shortness of breath. 8.5 each 2   amLODipine  (NORVASC ) 10 MG tablet TAKE 1 TABLET BY MOUTH EVERY DAY 90 tablet 1   aspirin  EC 81 MG tablet Take 1 tablet (81 mg total) by mouth daily. 30 tablet 0   atorvastatin  (LIPITOR) 80 MG tablet Take 1 tablet (80 mg total) by mouth daily. 90 tablet 3   COMIRNATY syringe      fenofibrate  (TRICOR ) 145 MG tablet TAKE 1 TABLET BY MOUTH EVERY DAY 90 tablet 1   finasteride  (PROSCAR ) 5 MG tablet TAKE 1 TABLET (5 MG TOTAL) BY MOUTH DAILY. 90 tablet 1   Fluticasone -Umeclidin-Vilant (TRELEGY ELLIPTA ) 100-62.5-25 MCG/ACT AEPB Inhale 1 puff into the lungs daily. 60 each 5   hydrochlorothiazide  (HYDRODIURIL ) 25 MG tablet Take 1 tablet (25 mg total) by mouth daily. 90 tablet 3   silodosin (RAPAFLO) 8 MG CAPS capsule Take 8 mg by mouth daily.     fluticasone  (FLONASE ) 50 MCG/ACT nasal spray SPRAY 2 SPRAYS INTO EACH NOSTRIL EVERY DAY (Patient not taking: Reported on 01/14/2024) 48 mL 1   gabapentin  (NEURONTIN ) 300 MG capsule Take 1 capsule (300 mg total) by mouth 3 (three) times daily. (Patient not taking: Reported on 01/14/2024) 90 capsule 1   levocetirizine (XYZAL ) 5 MG tablet Take 1 tablet (5 mg total) by mouth daily as needed for allergies. (Patient not taking: Reported on 01/14/2024) 30 tablet 5   Multiple Vitamins-Minerals (MULTIVITAMIN WITH MINERALS)  tablet Take 1 tablet by mouth daily. (Patient not taking: Reported on 01/14/2024) 30 tablet 5   pantoprazole  (PROTONIX ) 40 MG tablet Take 1 tablet (40 mg total) by mouth daily. 90 tablet 3   No current facility-administered medications for this visit.    Allergies as of 01/14/2024   (No Known Allergies)    Past Medical History:  Diagnosis Date   Arthritis    Asthma    BPH (benign prostatic hyperplasia)     Hepatitis    treated for Hep C in the past   History of substance abuse (HCC)    cocaine- over 20 years ago   Hyperlipidemia    Hypertension    Stroke [facility])    Vertigo     Past Surgical History:  Procedure Laterality Date   BIOPSY  10/26/2022   Procedure: BIOPSY;  Surgeon: Nannette Babe, MD;  Location: [facility] ENDOSCOPY;  Service: Gastroenterology;;   COLONOSCOPY     ESOPHAGOGASTRODUODENOSCOPY (EGD) WITH PROPOFOL  N/A 10/26/2022   Procedure: ESOPHAGOGASTRODUODENOSCOPY (EGD) WITH PROPOFOL ;  Surgeon: Nannette Babe, MD;  Location: [facility] ENDOSCOPY;  Service: Gastroenterology;  Laterality: N/A;   LAMINECTOMY WITH POSTERIOR LATERAL ARTHRODESIS LEVEL 4 N/A 10/19/2022   Procedure: Lumbar one to Sacral one Laminectomies with Lumbar one to Sacral one Posterolateral instrumented fusion;  Surgeon: Cannon Champion, MD;  Location: [facility] OR;  Service: Neurosurgery;  Laterality: N/A;   MULTIPLE TOOTH EXTRACTIONS      Review of Systems:    All systems reviewed and negative except where noted in HPI.    Physical Exam:  BP 132/64   Pulse 83   Ht 5\' 11"  (1.803 m)   Wt 216 lb (98 kg)   SpO2 94%   BMI 30.13 kg/m  No LMP for male patient.  General: Well-nourished, well-developed in no acute distress.  Lungs: Clear to auscultation bilaterally. Non-labored. Heart: Regular rate and rhythm, no murmurs rubs or gallops.  Abdomen: Bowel sounds are normal; Abdomen is Soft; No hepatosplenomegaly, masses or hernias;  No Abdominal Tenderness; No guarding or rebound tenderness. Neuro: Alert and oriented x 3.  Grossly intact.  Psych: Alert and cooperative, normal mood and affect.   Imaging Studies: No results found.  Assessment and Plan:   Todd Mendoza is a 73 y.o. y/o male presents for evaluation of:  1.  Liver mass: 2.6cm in Right Hepatic Lobe.  Abdominal MRI with Eovist  contrast (Liver protocol)   Labs: AFP, CEA, CMP  2.  Chronic anemia  Labs: CBC, iron panel, ferritin, B12, folate  Not currently on  iron or B12.  3.  GERD with esophagitis -controlled on PPI.  Continue Pantoprazole  40mg  1 tablet once daily.  Recommend Lifestyle Modifications to prevent Acid Reflux.  Rec. Avoid coffee, sodas, peppermint, garlic, onions, alcohol, citrus fruits, chocolate, tomatoes, fatty and spicey foods.  Avoid eating 2-3 hours before bedtime.    4.  History of adenomatous colon polyps  3 year repeat colonoscopy due 07/2024   Todd Canard, PA-C  Follow up 3 months with TG.

## 2024-01-14 ENCOUNTER — Encounter: Payer: Self-pay | Admitting: Physician Assistant

## 2024-01-14 ENCOUNTER — Ambulatory Visit: Admitting: Physician Assistant

## 2024-01-14 ENCOUNTER — Other Ambulatory Visit (INDEPENDENT_AMBULATORY_CARE_PROVIDER_SITE_OTHER)

## 2024-01-14 VITALS — BP 132/64 | HR 83 | Ht 71.0 in | Wt 216.0 lb

## 2024-01-14 DIAGNOSIS — R160 Hepatomegaly, not elsewhere classified: Secondary | ICD-10-CM

## 2024-01-14 DIAGNOSIS — D649 Anemia, unspecified: Secondary | ICD-10-CM

## 2024-01-14 DIAGNOSIS — Z860101 Personal history of adenomatous and serrated colon polyps: Secondary | ICD-10-CM | POA: Diagnosis not present

## 2024-01-14 DIAGNOSIS — K2100 Gastro-esophageal reflux disease with esophagitis, without bleeding: Secondary | ICD-10-CM

## 2024-01-14 DIAGNOSIS — Z860100 Personal history of colon polyps, unspecified: Secondary | ICD-10-CM

## 2024-01-14 LAB — IBC + FERRITIN
Ferritin: 6 ng/mL — ABNORMAL LOW (ref 22.0–322.0)
Iron: 42 ug/dL (ref 42–165)
Saturation Ratios: 10.9 % — ABNORMAL LOW (ref 20.0–50.0)
TIBC: 383.6 ug/dL (ref 250.0–450.0)
Transferrin: 274 mg/dL (ref 212.0–360.0)

## 2024-01-14 LAB — VITAMIN B12: Vitamin B-12: 424 pg/mL (ref 211–911)

## 2024-01-14 LAB — CBC WITH DIFFERENTIAL/PLATELET
Basophils Absolute: 0 10*3/uL (ref 0.0–0.1)
Basophils Relative: 0.8 % (ref 0.0–3.0)
Eosinophils Absolute: 0.3 10*3/uL (ref 0.0–0.7)
Eosinophils Relative: 6.3 % — ABNORMAL HIGH (ref 0.0–5.0)
HCT: 31.3 % — ABNORMAL LOW (ref 39.0–52.0)
Hemoglobin: 9.7 g/dL — ABNORMAL LOW (ref 13.0–17.0)
Lymphocytes Relative: 42.2 % (ref 12.0–46.0)
Lymphs Abs: 2.3 10*3/uL (ref 0.7–4.0)
MCHC: 31 g/dL (ref 30.0–36.0)
MCV: 77.8 fl — ABNORMAL LOW (ref 78.0–100.0)
Monocytes Absolute: 0.5 10*3/uL (ref 0.1–1.0)
Monocytes Relative: 9.7 % (ref 3.0–12.0)
Neutro Abs: 2.3 10*3/uL (ref 1.4–7.7)
Neutrophils Relative %: 41 % — ABNORMAL LOW (ref 43.0–77.0)
Platelets: 313 10*3/uL (ref 150.0–400.0)
RBC: 4.03 Mil/uL — ABNORMAL LOW (ref 4.22–5.81)
RDW: 18.1 % — ABNORMAL HIGH (ref 11.5–15.5)
WBC: 5.5 10*3/uL (ref 4.0–10.5)

## 2024-01-14 LAB — COMPREHENSIVE METABOLIC PANEL WITH GFR
ALT: 17 U/L (ref 0–53)
AST: 20 U/L (ref 0–37)
Albumin: 4.2 g/dL (ref 3.5–5.2)
Alkaline Phosphatase: 74 U/L (ref 39–117)
BUN: 6 mg/dL (ref 6–23)
CO2: 29 meq/L (ref 19–32)
Calcium: 9.4 mg/dL (ref 8.4–10.5)
Chloride: 104 meq/L (ref 96–112)
Creatinine, Ser: 0.93 mg/dL (ref 0.40–1.50)
GFR: 81.29 mL/min (ref >60.00)
Glucose, Bld: 97 mg/dL (ref 70–99)
Potassium: 3.4 meq/L — ABNORMAL LOW (ref 3.5–5.1)
Sodium: 140 meq/L (ref 135–145)
Total Bilirubin: 0.5 mg/dL (ref 0.2–1.2)
Total Protein: 7.5 g/dL (ref 6.0–8.3)

## 2024-01-14 LAB — FOLATE: Folate: 10.2 ng/mL (ref >5.9)

## 2024-01-14 MED ORDER — PANTOPRAZOLE SODIUM 40 MG PO TBEC: 40.0000 mg | DELAYED_RELEASE_TABLET | Freq: Every day | ORAL | 3 refills | Status: AC

## 2024-01-14 NOTE — Patient Instructions (Signed)
 Your provider has requested that you go to the basement level for lab work before leaving today. Press "B" on the elevator. The lab is located at the first door on the left as you exit the elevator.  You have been scheduled for an MRI at [facility] radiology on 01/17/24. Your appointment time is 8:30am. Please arrive to admitting (at main entrance of the hospital) 30 minutes prior to your appointment time for registration purposes. Please make certain not to have anything to eat or drink 6 hours prior to your test. In addition, if you have any metal in your body, have a pacemaker or defibrillator, please be sure to let your ordering physician know. This test typically takes 45 minutes to 1 hour to complete. Should you need to reschedule, please call [phone] to do so.  _______________________________________________________  If your blood pressure at your visit was 140/90 or greater, please contact your primary care physician to follow up on this.  _______________________________________________________  If you are age 19 or older, your body mass index should be between 23-30. Your Body mass index is 30.13 kg/m. If this is out of the aforementioned range listed, please consider follow up with your Primary Care Provider.  If you are age 42 or younger, your body mass index should be between 19-25. Your Body mass index is 30.13 kg/m. If this is out of the aformentioned range listed, please consider follow up with your Primary Care Provider.   ________________________________________________________  The [facility] providers would like to encourage you to use MYCHART to communicate with providers for non-urgent requests or questions.  Due to long hold times on the telephone, sending your provider a message by [facility] may be a faster and more efficient way to get a response.  Please allow 48 business hours for a response.  Please remember that this is for non-urgent requests.   _______________________________________________________

## 2024-01-15 NOTE — Progress Notes (Signed)
 Attending Physician's Attestation   I have reviewed the chart.   I agree with the Advanced Practitioner's note, impression, and recommendations with any updates as below. Agree with workup as outlined.  Next available MRI/MRCP needs to be completed.  If there is still some concern as to what this is, we will need to send to interventional radiology for attempt at sampling.  Laboratories will help guide next steps as well.  Would recommend trying to also order a CA 19-9 if possible/add on to the other labs that were drawn.    Yong Henle, MD [facility]oscopy Office # [phone]

## 2024-01-16 ENCOUNTER — Telehealth: Payer: Self-pay | Admitting: *Deleted

## 2024-01-16 NOTE — Telephone Encounter (Signed)
 I have spoken to [facility] at [employer] (phone [phone] [place] ID [id number]) to add CA 19-9, test code 4698 to labwork previously completed on 01/14/24. Turn around time for results is 3 days.

## 2024-01-16 NOTE — Telephone Encounter (Signed)
-----   Message from Brigitte Canard sent at 01/15/2024  2:27 PM EDT ----- Regarding: Add On CA19-9 Lab Per Dr. Brice Campi - Please call lab and ask if we can add on CA19-9. Dx: Liver mass. Thank you, Brian Campanile ----- Message ----- From: Normie Becton., MD Sent: 01/15/2024   1:17 PM EDT To: Brigitte Canard, PA-C     ----- Message ----- From: Brigitte Canard, PA-C Sent: 01/14/2024   4:26 PM EDT To: Normie Becton., MD

## 2024-01-17 ENCOUNTER — Ambulatory Visit (HOSPITAL_COMMUNITY)

## 2024-01-17 ENCOUNTER — Ambulatory Visit (HOSPITAL_COMMUNITY)
Admission: RE | Admit: 2024-01-17 | Discharge: 2024-01-17 | Disposition: A | Discharge status: Home / Self Care | Source: Ambulatory Visit | Attending: Physician Assistant | Admitting: Physician Assistant

## 2024-01-17 DIAGNOSIS — R160 Hepatomegaly, not elsewhere classified: Secondary | ICD-10-CM | POA: Diagnosis not present

## 2024-01-17 DIAGNOSIS — I700 Atherosclerosis of aorta: Secondary | ICD-10-CM | POA: Diagnosis not present

## 2024-01-17 MED ORDER — GADOXETATE DISODIUM 0.25 MMOL/ML IV SOLN
10.0000 mL | Freq: Once | INTRAVENOUS | Status: AC | PRN
Start: 2024-01-17 — End: 2024-01-17
  Administered 2024-01-17: 10 mL via INTRAVENOUS

## 2024-01-18 LAB — CEA: CEA: <2 ng/mL

## 2024-01-18 LAB — AFP TUMOR MARKER: AFP-Tumor Marker: 1.5 ng/mL (ref <6.1)

## 2024-01-18 LAB — CANCER ANTIGEN 19-9: CA 19-9: 6 U/mL (ref <34)

## 2024-01-21 ENCOUNTER — Encounter: Payer: Medicare Other | Admitting: Family

## 2024-01-21 ENCOUNTER — Telehealth: Payer: Self-pay | Admitting: Physician Assistant

## 2024-01-21 DIAGNOSIS — R160 Hepatomegaly, not elsewhere classified: Secondary | ICD-10-CM

## 2024-01-21 NOTE — Telephone Encounter (Signed)
 Patient wife retiring call. Please advise.

## 2024-01-21 NOTE — Telephone Encounter (Signed)
 I have spoken to patient to advise that MRI continues to show liver mass. We would like to complete a liver biopsy for further evaluation. Patient is advised that radiology should contact him shortly to schedule this biopsy. Patient verbalizes understanding.

## 2024-01-21 NOTE — Telephone Encounter (Signed)
 Patient and patient's wife returning phone call regarding recent lab results. Please advise, thank you.

## 2024-01-22 NOTE — Progress Notes (Signed)
 Notify patient CA 19-9, AFP, and CEA tumor markers are all normal.  This is good news.  Continue with plan for liver biopsy as scheduled. Brigitte Canard, PA-C

## 2024-01-23 NOTE — Progress Notes (Unsigned)
 Todd Cord, MD  [name] Approved for US  guided biopsy of liver lesion under moderate sedation.  Liver is central in segment 5/6.  See MRI.  HKM       Previous Messages    ----- Message ----- From: Derrell Flight Sent: 01/22/2024   9:31 AM EDT To: Derrell Flight; Ir Procedure Requests Subject: US  BIOPSY (LIVER)                              Procedure :US  BIOPSY (LIVER)  Reason :liver mass Dx: Liver mass, right lobe  History :MR LIVER W WO CONTRAST ,MR ABDOMEN WWO CONTRAST,CT CHEST LUNG CA SCREEN LOW DOSE W/O CM,DG Chest 2 View,  Provider:Garrett, Daryle Eon  Provider contact : [phone]

## 2024-01-24 NOTE — Progress Notes (Signed)
   This encounter was created in error - please disregard. No show

## 2024-02-04 DIAGNOSIS — K219 Gastro-esophageal reflux disease without esophagitis: Secondary | ICD-10-CM | POA: Diagnosis not present

## 2024-02-04 DIAGNOSIS — J432 Centrilobular emphysema: Secondary | ICD-10-CM | POA: Diagnosis not present

## 2024-02-04 DIAGNOSIS — Z79899 Other long term (current) drug therapy: Secondary | ICD-10-CM | POA: Diagnosis not present

## 2024-02-04 DIAGNOSIS — J8410 Pulmonary fibrosis, unspecified: Secondary | ICD-10-CM | POA: Diagnosis not present

## 2024-02-04 DIAGNOSIS — I509 Heart failure, unspecified: Secondary | ICD-10-CM | POA: Diagnosis not present

## 2024-02-04 DIAGNOSIS — R160 Hepatomegaly, not elsewhere classified: Secondary | ICD-10-CM | POA: Diagnosis not present

## 2024-02-04 DIAGNOSIS — I110 Hypertensive heart disease with heart failure: Secondary | ICD-10-CM | POA: Diagnosis not present

## 2024-02-05 ENCOUNTER — Other Ambulatory Visit (HOSPITAL_COMMUNITY): Payer: Self-pay | Admitting: Student

## 2024-02-05 DIAGNOSIS — R160 Hepatomegaly, not elsewhere classified: Secondary | ICD-10-CM

## 2024-02-06 ENCOUNTER — Ambulatory Visit (HOSPITAL_COMMUNITY)
Admission: RE | Admit: 2024-02-06 | Discharge: 2024-02-06 | Disposition: A | Discharge status: Home / Self Care | Source: Ambulatory Visit | Attending: Physician Assistant | Admitting: Physician Assistant

## 2024-02-06 DIAGNOSIS — R160 Hepatomegaly, not elsewhere classified: Secondary | ICD-10-CM

## 2024-03-03 ENCOUNTER — Other Ambulatory Visit: Payer: Self-pay

## 2024-03-03 DIAGNOSIS — Z01818 Encounter for other preprocedural examination: Secondary | ICD-10-CM

## 2024-03-04 ENCOUNTER — Other Ambulatory Visit: Payer: Self-pay | Admitting: Interventional Radiology

## 2024-03-04 DIAGNOSIS — Z01818 Encounter for other preprocedural examination: Secondary | ICD-10-CM

## 2024-03-04 NOTE — Progress Notes (Incomplete)
 Chief Complaint: Patient was seen in consultation today for liver mass at the request of Garrett,Tina  Referring Physician(s): Brigitte Canard  Supervising Physician: Creasie Doctor  Patient Status: [facility] - Out-pt  History of Present Illness: Todd Mendoza. is a 73 y.o. male with PMHs of HTN, stroke, hep C, BPH, and recent finding of liver mass who presents for liver mass bx.   Patient underwent lung cancer screening CT on 09/07/23 which showed liver mass, he was referred to GI on 01/14/24 for further eval and management,  AFP and CEA were in normal range. MR liver showed rim hypoenhancing mass of the inferiorright lobe of the liver, hepatic segment V/VI, measuring 3.0 x 2.8 cm. Biopsy of the liver mass was recommended to the patient which he decided to proceed. IR was requested for the bx, case reviewed and approved for US  guided liver mass bx by Dr. Marne Sings.   ***   Past Medical History:  Diagnosis Date   Arthritis    Asthma    BPH (benign prostatic hyperplasia)    Hepatitis    treated for Hep C in the past   History of substance abuse (HCC)    cocaine- over 20 years ago   Hyperlipidemia    Hypertension    Stroke [facility])    Vertigo     Past Surgical History:  Procedure Laterality Date   BIOPSY  10/26/2022   Procedure: BIOPSY;  Surgeon: Nannette Babe, MD;  Location: [facility] ENDOSCOPY;  Service: Gastroenterology;;   COLONOSCOPY     ESOPHAGOGASTRODUODENOSCOPY (EGD) WITH PROPOFOL  N/A 10/26/2022   Procedure: ESOPHAGOGASTRODUODENOSCOPY (EGD) WITH PROPOFOL ;  Surgeon: Nannette Babe, MD;  Location: [facility] ENDOSCOPY;  Service: Gastroenterology;  Laterality: N/A;   LAMINECTOMY WITH POSTERIOR LATERAL ARTHRODESIS LEVEL 4 N/A 10/19/2022   Procedure: Lumbar one to Sacral one Laminectomies with Lumbar one to Sacral one Posterolateral instrumented fusion;  Surgeon: Cannon Champion, MD;  Location: [facility] OR;  Service: Neurosurgery;  Laterality: N/A;   MULTIPLE TOOTH EXTRACTIONS       Allergies: Patient has no known allergies.  Medications: Prior to Admission medications   Medication Sig Start Date End Date Taking? Authorizing Provider  albuterol  (VENTOLIN  HFA) 108 (90 Base) MCG/ACT inhaler Inhale 2 puffs into the lungs every 6 (six) hours as needed for wheezing or shortness of breath. 07/20/23   Cobb, Mariah Shines, NP  amLODipine  (NORVASC ) 10 MG tablet TAKE 1 TABLET BY MOUTH EVERY DAY 12/24/23   Ngetich, Dinah C, NP  aspirin  EC 81 MG tablet Take 1 tablet (81 mg total) by mouth daily. 12/16/12   Nelma Band, MD  atorvastatin  (LIPITOR) 80 MG tablet Take 1 tablet (80 mg total) by mouth daily. 09/10/23   Ngetich, Dinah C, NP  COMIRNATY syringe  05/28/23   [provider]  fenofibrate  (TRICOR ) 145 MG tablet TAKE 1 TABLET BY MOUTH EVERY DAY 01/02/23   Ngetich, Dinah C, NP  finasteride  (PROSCAR ) 5 MG tablet TAKE 1 TABLET (5 MG TOTAL) BY MOUTH DAILY. 04/07/22   Lawrance Presume, MD  fluticasone  (FLONASE ) 50 MCG/ACT nasal spray SPRAY 2 SPRAYS INTO EACH NOSTRIL EVERY DAY Patient not taking: Reported on 01/14/2024 11/14/23   Roetta Clarke, NP  Fluticasone -Umeclidin-Vilant (TRELEGY ELLIPTA ) 100-62.5-25 MCG/ACT AEPB Inhale 1 puff into the lungs daily. 11/01/23   Denson Flake, MD  gabapentin  (NEURONTIN ) 300 MG capsule Take 1 capsule (300 mg total) by mouth 3 (three) times daily. Patient not taking: Reported on 01/14/2024 05/22/23   Ngetich, Dinah  C, NP  hydrochlorothiazide  (HYDRODIURIL ) 25 MG tablet Take 1 tablet (25 mg total) by mouth daily. 05/22/23 05/21/24  Ngetich, Dinah C, NP  levocetirizine (XYZAL ) 5 MG tablet Take 1 tablet (5 mg total) by mouth daily as needed for allergies. Patient not taking: Reported on 01/14/2024 02/13/23   Roetta Clarke, NP  Multiple Vitamins-Minerals (MULTIVITAMIN WITH MINERALS) tablet Take 1 tablet by mouth daily. Patient not taking: Reported on 01/14/2024 07/07/22   Ngetich, Dinah C, NP  pantoprazole  (PROTONIX ) 40 MG tablet Take 1 tablet (40  mg total) by mouth daily. 01/14/24 01/08/25  Brigitte Canard, PA-C  silodosin (RAPAFLO) 8 MG CAPS capsule Take 8 mg by mouth daily. 07/18/21   [provider]     Family History  Problem Relation Age of Onset   Diabetes Father    Colon cancer Neg Hx    Colon polyps Neg Hx    Esophageal cancer Neg Hx    Stomach cancer Neg Hx    Rectal cancer Neg Hx     Social History   Socioeconomic History   Marital status: Married    Spouse name: Not on file   Number of children: Not on file   Years of education: Not on file   Highest education level: Not on file  Occupational History   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[employer]  Tobacco Use   Smoking status: Former    Current packs/day: 0.00    Average packs/day: 0.2 packs/day for 43.0 years (8.6 ttl pk-yrs)    Types: Cigarettes    Start date: 64    Quit date: 2009    Years since quitting: 16.4    Passive exposure: Past   Smokeless tobacco: Never  Vaping Use   Vaping status: Never Used  Substance and Sexual Activity   Alcohol use: No    Alcohol/week: 0.0 standard drinks of alcohol   Drug use: Not Currently    Types: Cocaine    Comment: 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  Other Topics Concern   Not on file  Social History Narrative   Married   Research officer, political party daily   Social Drivers of Health   Financial Resource Strain: Low Risk  (06/19/2021)   Overall Financial Resource Strain (CARDIA)    Difficulty of Paying Living Expenses: Not hard at all  Food Insecurity: No Food Insecurity (10/30/2022)   Hunger Vital Sign    Worried About Running Out of Food in the Last Year: Never true    Ran Out of Food in the Last Year: Never true  Transportation Needs: No Transportation Needs (12/14/2022)   PRAPARE - Administrator, Civil Service (Medical): No    Lack of Transportation (Non-Medical): No  Physical Activity: Sufficiently Active (06/19/2021)   Exercise Vital Sign    Days of  Exercise per Week: 4 days    Minutes of Exercise per Session: 40 min  Stress: No Stress Concern Present (03/25/2020)   [employer] of Occupational Health - Occupational Stress Questionnaire    Feeling of Stress : Not at all  Social Connections: Moderately Integrated (06/19/2021)   Social Connection and Isolation Panel [NHANES]    Frequency of Communication with Friends and Family: Three times a week    Frequency of Social Gatherings with Friends and Family: Three times a week    Attends Religious Services: More than 4 times per year    Active Member of Clubs or  Organizations: No    Attends Banker Meetings: Never    Marital Status: Married     Review of Systems: A 12 point ROS discussed and pertinent positives are indicated in the HPI above.  All other systems are negative.  Advance Care Plan: The advanced care plan/surrogate decision maker was discussed at the time of visit and documented in the medical record.   Vital Signs: There were no vitals taken for this visit.  *** Physical Exam     Imaging: No results found.  Labs:  CBC: Recent Labs    05/22/23 1041 01/14/24 1147  WBC 5.5 5.5  HGB 10.1* 9.7*  HCT 33.2* 31.3*  PLT 364 313.0    COAGS: No results for input(s): "INR", "APTT" in the last 8760 hours.  BMP: Recent Labs    05/22/23 1041 01/14/24 1147  NA 140 140  K 5.1 3.4*  CL 105 104  CO2 27 29  GLUCOSE 87 97  BUN 13 6  CALCIUM  9.9 9.4  CREATININE 1.07 0.93    LIVER FUNCTION TESTS: Recent Labs    05/22/23 1041 10/08/23 0840 01/14/24 1147  BILITOT 0.5 0.4 0.5  AST 23 21 20   ALT 17 17 17   ALKPHOS  --   --  74  PROT 7.7 7.0 7.5  ALBUMIN  --   --  4.2    TUMOR MARKERS: Recent Labs    01/14/24 1147  AFPTM 1.5  CEA <2.0  CA199 6    Assessment and Plan: 73 y.o. male with liver mass who presents for biopsy.   NPO since MN VSS*** CBC INR On ASA 81 mg, has been held for *** days  NKDA  Risks and benefits of liver  mass bx was discussed with the patient and/or patient's family including, but not limited to bleeding, infection, damage to adjacent structures or low yield requiring additional tests.  All of the questions were answered and there is agreement to proceed.  Consent signed and in chart.    Thank you for this interesting consult.  I greatly enjoyed meeting Shadoe Bethel. and look forward to participating in their care.  A copy of this report was sent to the requesting provider on this date.  Electronically Signed: Darel Ebbs, PA-C 03/04/2024, 8:08 PM   I spent a total of  30 Minutes   in face to face in clinical consultation, greater than 50% of which was counseling/coordinating care for liver mass bx.  This chart was dictated using voice recognition software.  Despite best efforts to proofread,  errors can occur which can change the documentation meaning.

## 2024-03-05 ENCOUNTER — Other Ambulatory Visit: Payer: Self-pay

## 2024-03-05 ENCOUNTER — Ambulatory Visit (HOSPITAL_COMMUNITY)
Admission: RE | Admit: 2024-03-05 | Discharge: 2024-03-05 | Disposition: A | Discharge status: Home / Self Care | Source: Ambulatory Visit | Attending: Physician Assistant | Admitting: Physician Assistant

## 2024-03-05 ENCOUNTER — Encounter (HOSPITAL_COMMUNITY): Payer: Self-pay

## 2024-03-05 DIAGNOSIS — N400 Enlarged prostate without lower urinary tract symptoms: Secondary | ICD-10-CM | POA: Diagnosis not present

## 2024-03-05 DIAGNOSIS — K7400 Hepatic fibrosis, unspecified: Secondary | ICD-10-CM | POA: Insufficient documentation

## 2024-03-05 DIAGNOSIS — Z8673 Personal history of transient ischemic attack (TIA), and cerebral infarction without residual deficits: Secondary | ICD-10-CM | POA: Diagnosis not present

## 2024-03-05 DIAGNOSIS — R160 Hepatomegaly, not elsewhere classified: Secondary | ICD-10-CM | POA: Insufficient documentation

## 2024-03-05 DIAGNOSIS — Z01818 Encounter for other preprocedural examination: Secondary | ICD-10-CM

## 2024-03-05 DIAGNOSIS — K769 Liver disease, unspecified: Secondary | ICD-10-CM | POA: Diagnosis not present

## 2024-03-05 DIAGNOSIS — Z87891 Personal history of nicotine dependence: Secondary | ICD-10-CM | POA: Diagnosis not present

## 2024-03-05 DIAGNOSIS — Z8619 Personal history of other infectious and parasitic diseases: Secondary | ICD-10-CM | POA: Insufficient documentation

## 2024-03-05 DIAGNOSIS — K7290 Hepatic failure, unspecified without coma: Secondary | ICD-10-CM | POA: Diagnosis not present

## 2024-03-05 LAB — CBC
HCT: 31.8 % — ABNORMAL LOW (ref 39.0–52.0)
Hemoglobin: 9.3 g/dL — ABNORMAL LOW (ref 13.0–17.0)
MCH: 22.5 pg — ABNORMAL LOW (ref 26.0–34.0)
MCHC: 29.2 g/dL — ABNORMAL LOW (ref 30.0–36.0)
MCV: 77 fL — ABNORMAL LOW (ref 80.0–100.0)
Platelets: 324 10*3/uL (ref 150–400)
RBC: 4.13 MIL/uL — ABNORMAL LOW (ref 4.22–5.81)
RDW: 18.6 % — ABNORMAL HIGH (ref 11.5–15.5)
WBC: 4.9 10*3/uL (ref 4.0–10.5)
nRBC: 0 % (ref 0.0–0.2)

## 2024-03-05 LAB — PROTIME-INR
INR: 1.1 (ref 0.8–1.2)
Prothrombin Time: 14.7 s (ref 11.4–15.2)

## 2024-03-05 MED ORDER — MIDAZOLAM HCL 2 MG/2ML IJ SOLN
INTRAMUSCULAR | Status: AC
  Filled 2024-03-05: qty 2

## 2024-03-05 MED ORDER — FENTANYL CITRATE (PF) 100 MCG/2ML IJ SOLN
INTRAMUSCULAR | Status: AC
Start: 2024-03-05 — End: 2024-03-05
  Filled 2024-03-05: qty 2

## 2024-03-05 MED ORDER — ACETAMINOPHEN 325 MG PO TABS: 650.0000 mg | ORAL_TABLET | Freq: Four times a day (QID) | ORAL | Status: DC | PRN

## 2024-03-05 MED ORDER — MIDAZOLAM HCL 2 MG/2ML IJ SOLN
INTRAMUSCULAR | Status: AC | PRN
  Administered 2024-03-05: .5 mg via INTRAVENOUS
  Administered 2024-03-05: 1 mg via INTRAVENOUS
  Administered 2024-03-05: .5 mg via INTRAVENOUS

## 2024-03-05 MED ORDER — FENTANYL CITRATE (PF) 100 MCG/2ML IJ SOLN
INTRAMUSCULAR | Status: AC | PRN
  Administered 2024-03-05: 25 ug via INTRAVENOUS
  Administered 2024-03-05: 50 ug via INTRAVENOUS
  Administered 2024-03-05: 25 ug via INTRAVENOUS

## 2024-03-05 NOTE — H&P (Signed)
 Chief Complaint: Patient was seen in consultation today for liver lesion  at the request of Garrett,Tina  Referring Physician(s): Brigitte Canard  Supervising Physician: Creasie Doctor  Patient Status: [facility] - Out-pt  History of Present Illness: Todd Mendoza. is a 73 y.o. male with PMHs of  stroke, hep C, BPH, and recent finding of liver mass who presents for liver mass bx.    Patient underwent lung cancer screening CT on 09/07/23 which showed liver mass, he was referred to GI on 01/14/24 for further eval and management,  AFP and CEA were in normal range. MR liver showed rim hypoenhancing mass of the inferiorright lobe of the liver, hepatic segment V/VI, measuring 3.0 x 2.8 cm. Biopsy of the liver mass was recommended to the patient which he decided to proceed. IR was requested for the bx, case reviewed and approved for US  guided liver mass bx by Dr. Marne Sings.   Patient laying in bed, not in acute distress.  Denise headache, fever, chills, shortness of breath, cough, chest pain, abdominal pain, nausea ,vomiting, and bleeding.   Past Medical History:  Diagnosis Date   Arthritis    Asthma    BPH (benign prostatic hyperplasia)    Hepatitis    treated for Hep C in the past   History of substance abuse (HCC)    cocaine- over 20 years ago   Hyperlipidemia    Hypertension    Stroke [facility])    Vertigo     Past Surgical History:  Procedure Laterality Date   BIOPSY  10/26/2022   Procedure: BIOPSY;  Surgeon: Nannette Babe, MD;  Location: [facility] ENDOSCOPY;  Service: Gastroenterology;;   COLONOSCOPY     ESOPHAGOGASTRODUODENOSCOPY (EGD) WITH PROPOFOL  N/A 10/26/2022   Procedure: ESOPHAGOGASTRODUODENOSCOPY (EGD) WITH PROPOFOL ;  Surgeon: Nannette Babe, MD;  Location: [facility] ENDOSCOPY;  Service: Gastroenterology;  Laterality: N/A;   LAMINECTOMY WITH POSTERIOR LATERAL ARTHRODESIS LEVEL 4 N/A 10/19/2022   Procedure: Lumbar one to Sacral one Laminectomies with Lumbar one to Sacral one Posterolateral  instrumented fusion;  Surgeon: Cannon Champion, MD;  Location: [facility] OR;  Service: Neurosurgery;  Laterality: N/A;   MULTIPLE TOOTH EXTRACTIONS      Allergies: Patient has no known allergies.  Medications: Prior to Admission medications   Medication Sig Start Date End Date Taking? Authorizing Provider  amLODipine  (NORVASC ) 10 MG tablet TAKE 1 TABLET BY MOUTH EVERY DAY 12/24/23  Yes Ngetich, Dinah C, NP  aspirin  EC 81 MG tablet Take 1 tablet (81 mg total) by mouth daily. 12/16/12  Yes Sharda, Neale Bale, MD  atorvastatin  (LIPITOR) 80 MG tablet Take 1 tablet (80 mg total) by mouth daily. 09/10/23  Yes Ngetich, Dinah C, NP  finasteride  (PROSCAR ) 5 MG tablet TAKE 1 TABLET (5 MG TOTAL) BY MOUTH DAILY. 04/07/22  Yes Lawrance Presume, MD  fluticasone  (FLONASE ) 50 MCG/ACT nasal spray SPRAY 2 SPRAYS INTO EACH NOSTRIL EVERY DAY 11/14/23  Yes Cobb, Mariah Shines, NP  Fluticasone -Umeclidin-Vilant (TRELEGY ELLIPTA ) 100-62.5-25 MCG/ACT AEPB Inhale 1 puff into the lungs daily. 11/01/23  Yes Denson Flake, MD  gabapentin  (NEURONTIN ) 300 MG capsule Take 1 capsule (300 mg total) by mouth 3 (three) times daily. 05/22/23  Yes Ngetich, Dinah C, NP  hydrochlorothiazide  (HYDRODIURIL ) 25 MG tablet Take 1 tablet (25 mg total) by mouth daily. 05/22/23 05/21/24 Yes Ngetich, Dinah C, NP  Multiple Vitamins-Minerals (MULTIVITAMIN WITH MINERALS) tablet Take 1 tablet by mouth daily. 07/07/22  Yes Ngetich, Dinah C, NP  pantoprazole  (PROTONIX ) 40 MG tablet  Take 1 tablet (40 mg total) by mouth daily. 01/14/24 01/08/25 Yes Brigitte Canard, PA-C  silodosin (RAPAFLO) 8 MG CAPS capsule Take 8 mg by mouth daily. 07/18/21  Yes [provider]  albuterol  (VENTOLIN  HFA) 108 (90 Base) MCG/ACT inhaler Inhale 2 puffs into the lungs every 6 (six) hours as needed for wheezing or shortness of breath. 07/20/23   Roetta Clarke, NP  COMIRNATY syringe  05/28/23   [provider]  fenofibrate  (TRICOR ) 145 MG tablet TAKE 1 TABLET BY MOUTH  EVERY DAY 01/02/23   Ngetich, Dinah C, NP  levocetirizine (XYZAL ) 5 MG tablet Take 1 tablet (5 mg total) by mouth daily as needed for allergies. 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[employer]  Tobacco Use   Smoking status: Former    Current packs/day: 0.00    Average packs/day: 0.2 packs/day for 43.0 years (8.6 ttl pk-yrs)    Types: Cigarettes    Start date: 52    Quit date: 2009    Years since quitting: 16.4    Passive exposure: Past   Smokeless tobacco: Never  Vaping Use   Vaping status: Never Used  Substance and Sexual Activity   Alcohol use: No    Alcohol/week: 0.0 standard drinks of alcohol   Drug use: Not Currently    Types: Cocaine    Comment: 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  Other Topics Concern   Not on file  Social History Narrative   Married   Research officer, political party daily   Social Drivers of Health   Financial Resource Strain: Low Risk  (06/19/2021)   Overall Financial Resource Strain (CARDIA)    Difficulty of Paying Living Expenses: Not hard at all  Food Insecurity: No Food Insecurity (10/30/2022)   Hunger Vital Sign    Worried About Running Out of Food in the Last Year: Never true    Ran Out of Food in the Last Year: Never true  Transportation Needs: No Transportation Needs (12/14/2022)   PRAPARE - Administrator, Civil Service (Medical): No    Lack of Transportation (Non-Medical): No  Physical Activity: Sufficiently  Active (06/19/2021)   Exercise Vital Sign    Days of Exercise per Week: 4 days    Minutes of Exercise per Session: 40 min  Stress: No Stress Concern Present (03/25/2020)   [employer] of Occupational Health - Occupational Stress Questionnaire    Feeling of Stress : Not at all  Social Connections: Moderately Integrated (06/19/2021)   Social Connection and Isolation Panel [NHANES]    Frequency of Communication with Friends and Family: Three times a week    Frequency of Social Gatherings with Friends and Family: Three times a week    Attends Religious Services: More than 4 times per year  Active Member of Clubs or Organizations: No    Attends Banker Meetings: Never    Marital Status: Married     Review of Systems: A 12 point ROS discussed and pertinent positives are indicated in the HPI above.  All other systems are negative.  Vital Signs: BP 136/84 (BP Location: Right Arm)   Pulse 64   Temp 98.4 F (36.9 C) (Oral)   Resp 17   Ht 5' 11 (1.803 m)   Wt 214 lb (97.1 kg)   SpO2 96%   BMI 29.85 kg/m    Physical Exam Vitals and nursing note reviewed.  Constitutional:      General: Patient is not in acute distress.    Appearance: Normal appearance. Patient is not ill-appearing.  HENT:     Head: Normocephalic and atraumatic.     Mouth/Throat:     Mouth: Mucous membranes are moist.     Pharynx: Oropharynx is clear.  Cardiovascular:     Rate and Rhythm: Normal rate and regular rhythm.     Pulses: Normal pulses.     Heart sounds: Normal heart sounds.  Pulmonary:     Effort: Pulmonary effort is normal.     Breath sounds: Normal breath sounds.  Abdominal:     General: Abdomen is flat. Bowel sounds are normal.     Palpations: Abdomen is soft.  Musculoskeletal:     Cervical back: Neck supple.  Skin:    General: Skin is warm and dry.     Coloration: Skin is not jaundiced or pale.  Neurological:     Mental Status: Patient is alert and oriented to person,  place, and time.  Psychiatric:        Mood and Affect: Mood normal.        Behavior: Behavior normal.        Judgment: Judgment normal.    MD Evaluation Airway: WNL Heart: WNL Abdomen: WNL ASA  Classification: 2 Mallampati/Airway Score: Two  Imaging: No results found.  Labs:  CBC: Recent Labs    05/22/23 1041 01/14/24 1147 03/05/24 1130  WBC 5.5 5.5 4.9  HGB 10.1* 9.7* 9.3*  HCT 33.2* 31.3* 31.8*  PLT 364 313.0 324    COAGS: Recent Labs    03/05/24 1130  INR 1.1    BMP: Recent Labs    05/22/23 1041 01/14/24 1147  NA 140 140  K 5.1 3.4*  CL 105 104  CO2 27 29  GLUCOSE 87 97  BUN 13 6  CALCIUM  9.9 9.4  CREATININE 1.07 0.93    LIVER FUNCTION TESTS: Recent Labs    05/22/23 1041 10/08/23 0840 01/14/24 1147  BILITOT 0.5 0.4 0.5  AST 23 21 20   ALT 17 17 17   ALKPHOS  --   --  74  PROT 7.7 7.0 7.5  ALBUMIN  --   --  4.2    TUMOR MARKERS: Recent Labs    01/14/24 1147  AFPTM 1.5  CEA <2.0  CA199 6    Assessment and Plan: 73 y.o. male with with liver mass who presents for biopsy.    NPO since MN VSS CBC INR 1.1  On ASA 81 mg, has been held for 7 days  NKDA   Risks and benefits of liver mass bx was discussed with the patient and/or patient's family including, but not limited to bleeding, infection, damage to adjacent structures or low yield requiring additional tests.   All of the questions were answered and there is agreement to  proceed.   Consent signed and in chart.     Thank you for this interesting consult.  I greatly enjoyed meeting Tremon Sainvil. and look forward to participating in their care.  A copy of this report was sent to the requesting provider on this date.  Electronically Signed: Darel Ebbs, PA-C 03/05/2024, 12:03 PM   I spent a total of  30 Minutes   in face to face in clinical consultation, greater than 50% of which was counseling/coordinating care for liver lesion biopsy.   This chart was dictated using  voice recognition software.  Despite best efforts to proofread,  errors can occur which can change the documentation meaning.

## 2024-03-05 NOTE — Discharge Instructions (Signed)
 May resume aspirin  tomorrow, 03/06/24

## 2024-03-10 LAB — SURGICAL PATHOLOGY

## 2024-03-14 NOTE — Telephone Encounter (Signed)
 Spoke to patient's wife (ok per DPR) to advise of liver biopsy results as per Brigitte Canard, PA-C. Advised no evidence of cancer. Patient has also been scheduled for follow up with Brian Campanile on 04/11/24 (as noted at her last office visit 01/14/24 to follow up in 3 months). [name] verbalizes understanding of this information.

## 2024-03-14 NOTE — Telephone Encounter (Signed)
 Patient wife is calling to discuss her husband Biopsy results. Patient wife is requesting a call back at [phone] . Please advise.

## 2024-03-14 NOTE — Telephone Encounter (Signed)
 Brian Campanile- Would you please review 03/05/24 liver biopsy result and advise?

## 2024-03-17 ENCOUNTER — Other Ambulatory Visit

## 2024-03-17 DIAGNOSIS — R160 Hepatomegaly, not elsewhere classified: Secondary | ICD-10-CM | POA: Diagnosis not present

## 2024-03-17 NOTE — Addendum Note (Signed)
 Addended by: CLAUDENE NAOMIE SAILOR on: 03/17/2024 09:33 AM   Modules accepted: Orders

## 2024-03-17 NOTE — Telephone Encounter (Signed)
 Spoke to patient's wife to advise of additional recommendations per Dr Wilhel[place]ia. She states patient will come today for labs. Orders have been placed in EPIC.   MR Liver orders placed in EPIC to be completed around 04/16/24.

## 2024-03-17 NOTE — Addendum Note (Signed)
 Addended by: CLAUDENE NAOMIE SAILOR on: 03/17/2024 03:19 PM   Modules accepted: Orders

## 2024-03-18 ENCOUNTER — Other Ambulatory Visit: Payer: Self-pay | Admitting: Family

## 2024-03-18 DIAGNOSIS — I10 Essential (primary) hypertension: Secondary | ICD-10-CM

## 2024-03-18 NOTE — Telephone Encounter (Signed)
 Patient wife states that patient is no longer seeing Roxan Plough, NP as PCP. He is now a patient at [facility]. Medication refill denied for this reason.

## 2024-03-20 ENCOUNTER — Ambulatory Visit: Payer: Self-pay | Admitting: Physician Assistant

## 2024-03-20 LAB — TEST AUTHORIZATION

## 2024-03-20 LAB — HEPATITIS C RNA QUANTITATIVE
HCV Quantitative Log: <1.18 {Log_IU}/mL
HCV RNA, PCR, QN: <15 [IU]/mL

## 2024-03-20 LAB — ANTI-SMOOTH MUSCLE ANTIBODY, IGG: Actin (Smooth Muscle) Antibody (IGG): 36 U — ABNORMAL HIGH (ref <20)

## 2024-03-20 LAB — ANTI-NUCLEAR AB-TITER (ANA TITER): ANA Titer 1: 1:80 {titer} — ABNORMAL HIGH

## 2024-03-20 LAB — MITOCHONDRIAL ANTIBODIES: Mitochondrial M2 Ab, IgG: <=20 U (ref <=20.0)

## 2024-03-20 LAB — IGG, IGA, IGM
IgG (Immunoglobin G), Serum: 1503 mg/dL (ref 600–1540)
IgM, Serum: 48 mg/dL — ABNORMAL LOW (ref 50–300)
Immunoglobulin A: 183 mg/dL (ref 70–320)

## 2024-03-20 LAB — ANA: Anti Nuclear Antibody (ANA): POSITIVE — AB

## 2024-03-26 ENCOUNTER — Other Ambulatory Visit: Payer: Self-pay | Admitting: Nurse Practitioner

## 2024-03-26 DIAGNOSIS — J4530 Mild persistent asthma, uncomplicated: Secondary | ICD-10-CM

## 2024-03-26 DIAGNOSIS — J432 Centrilobular emphysema: Secondary | ICD-10-CM

## 2024-04-09 ENCOUNTER — Other Ambulatory Visit: Payer: Self-pay | Admitting: Nurse Practitioner

## 2024-04-09 ENCOUNTER — Ambulatory Visit
Admission: RE | Admit: 2024-04-09 | Discharge: 2024-04-09 | Disposition: A | Discharge status: Home / Self Care | Source: Ambulatory Visit | Attending: Nurse Practitioner | Admitting: Nurse Practitioner

## 2024-04-09 DIAGNOSIS — M25552 Pain in left hip: Secondary | ICD-10-CM

## 2024-04-09 NOTE — Telephone Encounter (Signed)
 Patient has been scheduled for repeat MRI liver at [facility] on 04/21/24 at 330 pm, 3 pm arrival, NPO 4 hours prior. Mrs.Heigl verbalizes understanding of this. Additionally, patient will keep scheduled appointment with Ellouise Console, PAC on 04/11/24.

## 2024-04-09 NOTE — Telephone Encounter (Signed)
 Also see 01/21/24 telephone notes regarding current plan of care.

## 2024-04-10 NOTE — Progress Notes (Unsigned)
 Ellouise Console, PA-C [address] [place], [place]  [address] Phone: [phone]   Primary Care Physician: Arloa Jarvis, NP  Primary Gastroenterologist:  Ellouise Console, PA-C / Dr. Wilhelmenia  Chief Complaint: Follow-up liver mass, iron deficiency anemia      HPI:   Todd Mendoza. is a 73 y.o. male returns for 6-month follow-up of liver mass and Iron Deficiency Anemia.  History of hepatitis C treated in 2021 (eradicated).    Liver mass: 2.6cm in Right Hepatic Lobe.  AFP, CEA, and CA 19-9 were Normal.  Liver biopsy showed no malignancy.  ASMA Elevated (36).  ANA positive.  AMA negative.  Normal Immunoglobulins.  HCV RNA PCR not detected (prior HCV treatment in 2021, erradicated).  Concern for Autoimmune cause of liver inflammation and liver biopsy was sent to [facility] for 2nd opinion.  Has f/u OV with Dr. Wilhelmenia 05/28/24.  Patient is scheduled for another repeat liver MRI 04/21/2024.  03/05/24: LIVER, NEEDLE CORE BIOPSY:  -Ischemic-type coagulative necrosis of hepatic parenchyma, with focal preserved regenerative nodules with fibrosis and marked plasma cell infiltrate.   01/13/2024: MRI liver with and without Gadavist contrast:  No significant change in a rim hypoenhancing mass of the inferior right lobe of the liver, hepatic segment V/VI, measuring 3.0 x 2.8cm.   10/28/2023 abdominal MRI with and without contrast:  2.6 cm heterogeneously enhancing mass in segment 6 of the right hepatic lobe, has nonspecific characteristics. Malignancy cannot be excluded. Recommend correlation with tumor markers, and consider tissue sampling, PET-CT, or short-term follow-up by MRI with Eovist  contrast in 3 months.   10/08/2023: Labs showed normal hepatic panel.  Normal LFTs. 12/2023 Labs: Normal lipase and amylase.  Hgb 9.7, MCV 77, total iron 42, iron saturation 10.9, ferritin 6 vitamin B12 - 424, potassium 3.4.  Normal LFTs.   10/26/2022 EGD by Dr. Albertus: Moderately severe esophagitis without  bleeding.  Mild gastritis.  Normal duodenum.  Biopsies negative for H. pylori.  Gastric biopsies showed mild chronic gastritis and focal intestinal metaplasia.  Esophageal biopsies consistent with reflux.  No metaplasia or dysplasia.   07/2021 colonoscopy by Dr. Wilhelmenia: Adequate prep; 3 small (2 mm to 5 mm) tubular adenoma polyps removed.  Internal and external hemorrhoids.  Diverticulosis.  Moderate inflammation in the cecum and ileocecal valve secondary to colitis.  Biopsies not consistent with UC.  3-year repeat (Due 07/2024).   07/2018 Colonoscopy: 3 small tubular adenoma polyps removed.   Current Symptoms: He denies any GI symptoms.  Acid reflux is controlled on pantoprazole  40 Mg once daily.  Denies abdominal pain, weight loss, bowel irregularities, or rectal bleeding.  He had admits to fatigue.  He is not currently on iron.  No other health concerns.  Current Outpatient Medications  Medication Sig Dispense Refill   albuterol  (VENTOLIN  HFA) 108 (90 Base) MCG/ACT inhaler TAKE 2 PUFFS BY MOUTH EVERY 6 HOURS AS NEEDED FOR WHEEZE OR SHORTNESS OF BREATH 8.5 each 2   amLODipine  (NORVASC ) 10 MG tablet TAKE 1 TABLET BY MOUTH EVERY DAY 90 tablet 1   aspirin  EC 81 MG tablet Take 1 tablet (81 mg total) by mouth daily. 30 tablet 0   atorvastatin  (LIPITOR) 80 MG tablet Take 1 tablet (80 mg total) by mouth daily. 90 tablet 3   COMIRNATY syringe      fenofibrate  (TRICOR ) 145 MG tablet TAKE 1 TABLET BY MOUTH EVERY DAY 90 tablet 1   finasteride  (PROSCAR ) 5 MG tablet TAKE 1 TABLET (5 MG  TOTAL) BY MOUTH DAILY. 90 tablet 1   fluticasone  (FLONASE ) 50 MCG/ACT nasal spray SPRAY 2 SPRAYS INTO EACH NOSTRIL EVERY DAY 48 mL 1   Fluticasone -Umeclidin-Vilant (TRELEGY ELLIPTA ) 100-62.5-25 MCG/ACT AEPB Inhale 1 puff into the lungs daily. 60 each 5   gabapentin  (NEURONTIN ) 300 MG capsule Take 1 capsule (300 mg total) by mouth 3 (three) times daily. 90 capsule 1   hydrochlorothiazide  (HYDRODIURIL ) 25 MG tablet Take 1  tablet (25 mg total) by mouth daily. 90 tablet 3   levocetirizine (XYZAL ) 5 MG tablet Take 1 tablet (5 mg total) by mouth daily as needed for allergies. 30 tablet 5   Multiple Vitamins-Minerals (MULTIVITAMIN WITH MINERALS) tablet Take 1 tablet by mouth daily. 30 tablet 5   pantoprazole  (PROTONIX ) 40 MG tablet Take 1 tablet (40 mg total) by mouth daily. 90 tablet 3   silodosin (RAPAFLO) 8 MG CAPS capsule Take 8 mg by mouth daily.     No current facility-administered medications for this visit.    Allergies as of 04/11/2024   (No Known Allergies)    Past Medical History:  Diagnosis Date   Arthritis    Asthma    BPH (benign prostatic hyperplasia)    Hepatitis    treated for Hep C in the past   History of substance abuse (HCC)    cocaine- over 20 years ago   Hyperlipidemia    Hypertension    Stroke [facility])    Vertigo     Past Surgical History:  Procedure Laterality Date   BIOPSY  10/26/2022   Procedure: BIOPSY;  Surgeon: Albertus Gordy HERO, MD;  Location: [facility] ENDOSCOPY;  Service: Gastroenterology;;   COLONOSCOPY     ESOPHAGOGASTRODUODENOSCOPY (EGD) WITH PROPOFOL  N/A 10/26/2022   Procedure: ESOPHAGOGASTRODUODENOSCOPY (EGD) WITH PROPOFOL ;  Surgeon: Albertus Gordy HERO, MD;  Location: [facility] ENDOSCOPY;  Service: Gastroenterology;  Laterality: N/A;   LAMINECTOMY WITH POSTERIOR LATERAL ARTHRODESIS LEVEL 4 N/A 10/19/2022   Procedure: Lumbar one to Sacral one Laminectomies with Lumbar one to Sacral one Posterolateral instrumented fusion;  Surgeon: Cheryle Debby LABOR, MD;  Location: [facility] OR;  Service: Neurosurgery;  Laterality: N/A;   MULTIPLE TOOTH EXTRACTIONS      Review of Systems:    All systems reviewed and negative except where noted in HPI.    Physical Exam:  BP 122/64   Pulse 80   Ht 5' 7.75 (1.721 m)   Wt 211 lb 2 oz (95.8 kg)   BMI 32.34 kg/m  No LMP for male patient.  General: Well-nourished, well-developed in no acute distress.  Lungs: Clear to auscultation bilaterally.  Non-labored. Heart: Regular rate and rhythm, no murmurs rubs or gallops.  Abdomen: Bowel sounds are normal; Abdomen is Soft; No hepatosplenomegaly, masses or hernias;  No Abdominal Tenderness; No guarding or rebound tenderness. Neuro: Alert and oriented x 3.  Grossly intact.  Psych: Alert and cooperative, normal mood and affect.   Imaging Studies: DG HIP UNILAT WITH PELVIS 2-3 VIEWS LEFT Result Date: 04/09/2024 CLINICAL DATA:  Left leg pain for 1 year after back surgery. EXAM: DG HIP (WITH OR WITHOUT PELVIS) 2-3V LEFT COMPARISON:  None Available. FINDINGS: There is no evidence of hip fracture or dislocation. Moderate severity degenerative changes are seen in the form of joint space narrowing, acetabular sclerosis and lateral acetabular bony spurring. Postoperative changes are present throughout the lumbar spine. 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 Electronically Signed   By: Suzen Dials M.D.   On: 04/09/2024 15:10  Labs: CBC    Component Value Date/Time   WBC 4.9 03/05/2024 1130   RBC 4.13 (L) 03/05/2024 1130   HGB 9.3 (L) 03/05/2024 1130   HGB 11.9 (L) 04/03/2022 1633   HCT 31.8 (L) 03/05/2024 1130   HCT 37.1 (L) 04/03/2022 1633   PLT 324 03/05/2024 1130   PLT 205 04/03/2022 1633   MCV 77.0 (L) 03/05/2024 1130   MCV 88 04/03/2022 1633   MCH 22.5 (L) 03/05/2024 1130   MCHC 29.2 (L) 03/05/2024 1130   RDW 18.6 (H) 03/05/2024 1130   RDW 12.5 04/03/2022 1633   LYMPHSABS 2.3 01/14/2024 1147   LYMPHSABS 2.6 05/18/2021 0957   MONOABS 0.5 01/14/2024 1147   EOSABS 0.3 01/14/2024 1147   EOSABS 0.4 05/18/2021 0957   BASOSABS 0.0 01/14/2024 1147   BASOSABS 0.0 05/18/2021 0957    CMP     Component Value Date/Time   NA 140 01/14/2024 1147   NA 145 (H) 04/03/2022 1633   K 3.4 (L) 01/14/2024 1147   CL 104 01/14/2024 1147   CO2 29 01/14/2024 1147   GLUCOSE 97 01/14/2024 1147   BUN 6 01/14/2024 1147   BUN 10 04/03/2022 1633   CREATININE 0.93  01/14/2024 1147   CREATININE 1.07 05/22/2023 1041   CALCIUM  9.4 01/14/2024 1147   PROT 7.5 01/14/2024 1147   PROT 7.6 04/03/2022 1633   ALBUMIN 4.2 01/14/2024 1147   ALBUMIN 4.5 04/03/2022 1633   AST 20 01/14/2024 1147   ALT 17 01/14/2024 1147   ALKPHOS 74 01/14/2024 1147   BILITOT 0.5 01/14/2024 1147   BILITOT 0.4 04/03/2022 1633   GFRNONAA >60 10/27/2022 0631   GFRAA 88 11/08/2020 0925       Assessment and Plan:   Todd Mendoza. is a 73 y.o. y/o male returns for followup of:  1.  Liver mass: 2.6cm in Right Hepatic Lobe.  AFP, CEA, and CA 19-9 were Normal.  Liver biopsy showed no malignancy.  ASMA Elevated (36).  ANA positive.  AMA negative.  Normal Immunoglobulins.  HCV RNA PCR not detected (prior treatment, erradicated).  Concern for Autoimmune cause of liver inflammation and liver biopsy was sent to [facility] for 2nd opinion.  Has f/u OV with Dr. Wilhelmenia 05/28/24. - Lab hepatic panel -Keep follow-up with Dr. Wilhelmenia -Proceed with repeat liver MRI with and without contrast 04/21/2024   2.  Iron Deficiency Anemia. (B12 and folate normal). - Not currently on iron - I prescribed ferrous sulfate 325 mg 1 tablet once daily, #30, 3 refills. - Labs CBC, iron panel, ferritin   3.  GERD with esophagitis -controlled on PPI. - Continue Pantoprazole  40mg  1 tablet once daily. - Avoid NSAIDs - GERD diet   4.  History of adenomatous colon polyps - 3 year repeat colonoscopy due 07/2024  5.  Hypokalemia  **Labs CBC, CMP, iron panel, ferritin  Ellouise Console, PA-C  Follow up as scheduled with Dr. Wilhelmenia 05/28/24.

## 2024-04-11 ENCOUNTER — Ambulatory Visit: Admitting: Physician Assistant

## 2024-04-11 ENCOUNTER — Encounter: Payer: Self-pay | Admitting: Physician Assistant

## 2024-04-11 ENCOUNTER — Ambulatory Visit: Admitting: Nurse Practitioner

## 2024-04-11 ENCOUNTER — Other Ambulatory Visit (INDEPENDENT_AMBULATORY_CARE_PROVIDER_SITE_OTHER)

## 2024-04-11 VITALS — BP 122/64 | HR 80 | Ht 67.75 in | Wt 211.1 lb

## 2024-04-11 DIAGNOSIS — K2100 Gastro-esophageal reflux disease with esophagitis, without bleeding: Secondary | ICD-10-CM | POA: Diagnosis not present

## 2024-04-11 DIAGNOSIS — E876 Hypokalemia: Secondary | ICD-10-CM | POA: Diagnosis not present

## 2024-04-11 DIAGNOSIS — Z860100 Personal history of colon polyps, unspecified: Secondary | ICD-10-CM

## 2024-04-11 DIAGNOSIS — Z860101 Personal history of adenomatous and serrated colon polyps: Secondary | ICD-10-CM

## 2024-04-11 DIAGNOSIS — D509 Iron deficiency anemia, unspecified: Secondary | ICD-10-CM

## 2024-04-11 DIAGNOSIS — R160 Hepatomegaly, not elsewhere classified: Secondary | ICD-10-CM

## 2024-04-11 LAB — COMPREHENSIVE METABOLIC PANEL WITH GFR
ALT: 19 U/L (ref 0–53)
AST: 28 U/L (ref 0–37)
Albumin: 4.4 g/dL (ref 3.5–5.2)
Alkaline Phosphatase: 91 U/L (ref 39–117)
BUN: 10 mg/dL (ref 6–23)
CO2: 30 meq/L (ref 19–32)
Calcium: 9.8 mg/dL (ref 8.4–10.5)
Chloride: 104 meq/L (ref 96–112)
Creatinine, Ser: 0.93 mg/dL (ref 0.40–1.50)
GFR: 81.16 mL/min (ref >60.00)
Glucose, Bld: 86 mg/dL (ref 70–99)
Potassium: 3.6 meq/L (ref 3.5–5.1)
Sodium: 142 meq/L (ref 135–145)
Total Bilirubin: 0.4 mg/dL (ref 0.2–1.2)
Total Protein: 7.8 g/dL (ref 6.0–8.3)

## 2024-04-11 LAB — CBC WITH DIFFERENTIAL/PLATELET
Basophils Absolute: 0 K/uL (ref 0.0–0.1)
Basophils Relative: 0.7 % (ref 0.0–3.0)
Eosinophils Absolute: 0.2 K/uL (ref 0.0–0.7)
Eosinophils Relative: 3.2 % (ref 0.0–5.0)
HCT: 29.7 % — ABNORMAL LOW (ref 39.0–52.0)
Hemoglobin: 9 g/dL — ABNORMAL LOW (ref 13.0–17.0)
Lymphocytes Relative: 41 % (ref 12.0–46.0)
Lymphs Abs: 2.4 K/uL (ref 0.7–4.0)
MCHC: 30.4 g/dL (ref 30.0–36.0)
MCV: 73.2 fl — ABNORMAL LOW (ref 78.0–100.0)
Monocytes Absolute: 0.6 K/uL (ref 0.1–1.0)
Monocytes Relative: 9.7 % (ref 3.0–12.0)
Neutro Abs: 2.7 K/uL (ref 1.4–7.7)
Neutrophils Relative %: 45.4 % (ref 43.0–77.0)
Platelets: 281 K/uL (ref 150.0–400.0)
RBC: 4.06 Mil/uL — ABNORMAL LOW (ref 4.22–5.81)
RDW: 19.3 % — ABNORMAL HIGH (ref 11.5–15.5)
WBC: 5.9 K/uL (ref 4.0–10.5)

## 2024-04-11 MED ORDER — FERROUS SULFATE 325 (65 FE) MG PO TABS: 325.0000 mg | ORAL_TABLET | Freq: Every day | ORAL | 3 refills | Status: DC

## 2024-04-11 NOTE — Patient Instructions (Signed)
 Your provider has requested that you go to the basement level for lab work before leaving today. Press B on the elevator. The lab is located at the first door on the left as you exit the elevator.  We have sent the following medications to your pharmacy for you to pick up at your convenience: Ferrous Sulfate 325 mg once daily   Please follow up sooner if symptoms increase or worsen  Due to recent changes in healthcare laws, you may see the results of your imaging and laboratory studies on MyChart before your provider has had a chance to review them.  We understand that in some cases there may be results that are confusing or concerning to you. Not all laboratory results come back in the same time frame and the provider may be waiting for multiple results in order to interpret others.  Please give us  48 hours in order for your provider to thoroughly review all the results before contacting the office for clarification of your results.   Thank you for trusting me with your gastrointestinal care!   Ellouise Console, PA-C _______________________________________________________  If your blood pressure at your visit was 140/90 or greater, please contact your primary care physician to follow up on this.  _______________________________________________________  If you are age 21 or older, your body mass index should be between 23-30. Your Body mass index is 32.34 kg/m. If this is out of the aforementioned range listed, please consider follow up with your Primary Care Provider.  If you are age 13 or younger, your body mass index should be between 19-25. Your Body mass index is 32.34 kg/m. If this is out of the aformentioned range listed, please consider follow up with your Primary Care Provider.   ________________________________________________________  The [facility] providers would like to encourage you to use MYCHART to communicate with providers for non-urgent requests or questions.  Due to long  hold times on the telephone, sending your provider a message by [facility] may be a faster and more efficient way to get a response.  Please allow 48 business hours for a response.  Please remember that this is for non-urgent requests.  _______________________________________________________

## 2024-04-12 LAB — IGG, IGA, IGM
IgG (Immunoglobin G), Serum: 1512 mg/dL (ref 600–1540)
IgM, Serum: 53 mg/dL (ref 50–300)
Immunoglobulin A: 191 mg/dL (ref 70–320)

## 2024-04-12 LAB — IRON,TIBC AND FERRITIN PANEL
%SAT: 8 % — ABNORMAL LOW (ref 20–48)
Ferritin: 4 ng/mL — ABNORMAL LOW (ref 24–380)
Iron: 28 ug/dL — ABNORMAL LOW (ref 50–180)
TIBC: 357 ug/dL (ref 250–425)

## 2024-04-13 NOTE — Progress Notes (Signed)
 Attending Physician's Attestation   I have reviewed the chart.   I agree with the Advanced Practitioner's note, impression, and recommendations with any updates as below. Awaiting results of second opinion liver biopsy from [facility]. Agree with follow-up as outlined otherwise.   Aloha Finner, MD [facility] Advanced Endoscopy Office # [phone]

## 2024-04-14 NOTE — Progress Notes (Signed)
 Call and notify patient hemoglobin and iron are still low, consistent with iron deficiency anemia.  He has not been taking iron.  I just prescribed iron tablet at his recent appointment.  Please tell patient to take iron tablet every day with vitamin C.  Keep follow-up appointment with Dr. Wilhelmenia in September.  We may need to repeat EGD and colonoscopy if iron deficiency anemia persists.  May also need to schedule capsule endoscopy.  Ellouise Console, PA-C  CC: Dr. Wilhelmenia for FYI

## 2024-04-16 ENCOUNTER — Other Ambulatory Visit: Payer: Self-pay

## 2024-04-16 DIAGNOSIS — D509 Iron deficiency anemia, unspecified: Secondary | ICD-10-CM

## 2024-04-18 ENCOUNTER — Encounter: Payer: Self-pay | Admitting: Gastroenterology

## 2024-04-18 NOTE — Progress Notes (Signed)
 Outside [facility] liver needle core biopsy consultation  Full pathology note will be scanned into the chart and sent to the patient for his records.  Liver parenchyma with mostly infarct and fibrotic tissue.  I assume that it is a target biopsy from a mass lesion seen radiographically.  I agree with the outside pathologist that there is no definite evidence of a neoplasm.  There are small aggregates of mildly atypical cells with high nuclear to cytoplasm ratio, but the focus is too small to further characterize.  The finding may represent a change secondary to a localized vascular injury, with focal atypical hepatocytes (likely reactive atypia).  We will try to get the block to get more H&E stains and reticulin stain to rule out a neoplastic process.  However, the focus is so small that it might be exhausted at the deeper sections.  Close follow-up of the mass lesion is recommended.  Rebiopsy may be considered if the lesion grows.  There are abundant plasma cells and rare lymphoid aggregates, which are only seen in the fibrotic areas.  There are few regenerative hepatocyte nodules, which are not significantly associated with inflammatory/plasma cell infiltrates.  There is no evidence of autoimmune hepatitis in this biopsy.  In addition this is a target biopsy, the regenerative nodules do not necessarily reflect the background liver.  To confirm or rule out autoimmune hepatitis and assess fibrosis stage, biopsy of the background liver is recommended if clinically indicated.  Results of examination of more sections will be issued in an addendum.    Aloha Finner, MD [facility] Advanced Endoscopy Office # [phone]

## 2024-04-21 ENCOUNTER — Ambulatory Visit (HOSPITAL_COMMUNITY)
Admission: RE | Admit: 2024-04-21 | Discharge: 2024-04-21 | Disposition: A | Discharge status: Home / Self Care | Source: Ambulatory Visit | Attending: Physician Assistant | Admitting: Physician Assistant

## 2024-04-21 ENCOUNTER — Ambulatory Visit (HOSPITAL_COMMUNITY)

## 2024-04-21 DIAGNOSIS — R160 Hepatomegaly, not elsewhere classified: Secondary | ICD-10-CM | POA: Insufficient documentation

## 2024-04-21 MED ORDER — GADOBUTROL 1 MMOL/ML IV SOLN
9.0000 mL | Freq: Once | INTRAVENOUS | Status: AC | PRN
  Administered 2024-04-21: 9 mL via INTRAVENOUS

## 2024-04-23 NOTE — Progress Notes (Signed)
 Call and notify patient abdominal MRI shows the previous liver lesion has decreased in size from 3 cm to 2 cm.  This is great news!  This favors a benign liver lesion as discussed.  No new liver lesions. Continue with current plan. Ellouise Console, PA-C  Cc: Dr. Wilhelmenia

## 2024-05-10 ENCOUNTER — Other Ambulatory Visit: Payer: Self-pay | Admitting: Nurse Practitioner

## 2024-05-10 DIAGNOSIS — J209 Acute bronchitis, unspecified: Secondary | ICD-10-CM

## 2024-05-10 DIAGNOSIS — R058 Other specified cough: Secondary | ICD-10-CM

## 2024-05-22 ENCOUNTER — Encounter: Payer: Self-pay | Admitting: Sports Medicine

## 2024-05-22 ENCOUNTER — Other Ambulatory Visit (INDEPENDENT_AMBULATORY_CARE_PROVIDER_SITE_OTHER)

## 2024-05-22 ENCOUNTER — Ambulatory Visit (INDEPENDENT_AMBULATORY_CARE_PROVIDER_SITE_OTHER): Admitting: Sports Medicine

## 2024-05-22 DIAGNOSIS — M160 Bilateral primary osteoarthritis of hip: Secondary | ICD-10-CM

## 2024-05-22 DIAGNOSIS — M25551 Pain in right hip: Secondary | ICD-10-CM | POA: Diagnosis not present

## 2024-05-22 DIAGNOSIS — R29898 Other symptoms and signs involving the musculoskeletal system: Secondary | ICD-10-CM | POA: Diagnosis not present

## 2024-05-22 DIAGNOSIS — M25552 Pain in left hip: Secondary | ICD-10-CM

## 2024-05-22 DIAGNOSIS — G8929 Other chronic pain: Secondary | ICD-10-CM

## 2024-05-22 NOTE — Progress Notes (Signed)
 Patient says that he has had pain over the sides of both hips for months now. He says that this pain used to come and go, but overtime has become constant. He has not done anything at home to treat it, and has not tried any stretches or exercises, but does have pain with his daily activities. He says he notices it most when he is walking. He works part-time, and does not notice the pain when he is at work. He denies any pain in the front of the hip or groin, and says that it remains in that spot regardless of what he is doing.  Patient was instructed in 10 minutes of therapeutic exercises for bilateral hips  to improve strength, ROM and function according to my instructions and plan of care by a Certified Athletic Trainer during the office visit. A customized handout was provided and demonstration of proper technique shown and discussed. Patient did perform exercises and demonstrate understanding through teachback.  All questions discussed and answered.

## 2024-05-22 NOTE — Progress Notes (Addendum)
 Todd Mendoza. - 73 y.o. male MRN [id number]  Date of birth: 01-11-1951  Office Visit Note: Visit Date: 05/22/2024 PCP: Arloa Jarvis, NP Referred by: Arloa Jarvis, NP  Subjective: Chief Complaint  Patient presents with   Left Hip - Pain   Right Hip - Pain   HPI: Todd Mendoza. is a pleasant 73 y.o. male who presents today for chronic bilateral hip pain.  [place] has had pain over bilateral lateral hips for a few months now.  This hurts him more so when he is walking.  This used to come and go over the past year but here more over the last few months his pain has become more persistent.  The left hip is worse than the right and more consistent in nature.  He has not done any sort of stretching/exercises nor taking any medication consistently for this.  He denies any pain over the anterior hip or into the groin.  Pertinent ROS were reviewed with the patient and found to be negative unless otherwise specified above in HPI.   Assessment & Plan: Visit Diagnoses:  1. Greater trochanteric pain syndrome of both lower extremities   2. Chronic hip pain, bilateral   3. Bilateral primary osteoarthritis of hip   4. Weakness of both hips    Plan: Impression is chronic and worsening bilateral hip pain which does have evidence of greater trochanteric pain syndrome with hip abduction weakness/gluteal insufficiency.  He also has moderate osteoarthritic changes with subchondral sclerosis of the bilateral acetabulum.  Both of these conditions are likely playing a role, but he has less pain emanating from the hip joint and more pain emanating from the greater trochanteric region/lateral hip.  Through shared decision making, we did proceed with bilateral greater trochanteric injections for both the right and left hip, patient tolerated well.  Advised on postinjection protocol.  He may use ice/heat as well as Tylenol  for any postinjection pain.  We discussed the importance of strengthening his gluteal  tendons and his hip stabilizers, he prefers home therapy versus formal PT.  We did print out a customized handout for him today and my athletic trainer, Jinnie, did review these with him in the room today.  Starting 72 hours status postinjection he may begin these on a daily basis.  I will see him back in 1 month for reevaluation.  Follow-up: Return in about 1 month (around 06/22/2024) for bilateral hips.   Meds & Orders: No orders of the defined types were placed in this encounter.   Orders Placed This Encounter  Procedures   XR HIP UNILAT W OR W/O PELVIS 2-3 VIEWS RIGHT     Procedures: Large Joint Inj: R greater trochanter on 06/23/2024 9:22 AM Indications: pain Details: 22 G 1.5 in needle, lateral approach Medications: 2 mL lidocaine  1 %; 2 mL bupivacaine  0.25 %; 6 mg betamethasone acetate-betamethasone sodium phosphate  6 (3-3) MG/ML Outcome: tolerated well, no immediate complications  Greater Trochanteric Bursa Injection, Right After discussion on risk/benefits/indications, an informed verbal consent was obtained and a timeout was performed. The patient was lying in lateral recumbent position on exam table. The area overlying the trochanteric bursa was prepped with Betadine and alcohol swab then injected with 2:2:1 lidocaine :bupivicaine:celestone. Patient tolerated procedure well without immediate complications.  Procedure, treatment alternatives, risks and benefits explained, specific risks discussed. Consent was given by the patient. Immediately prior to procedure a time out was called to verify the correct patient, procedure, equipment, support staff and site/side marked as required.  Patient was prepped and draped in the usual sterile fashion.    Large Joint Inj: L greater trochanter on 06/23/2024 9:22 AM Indications: pain Details: 22 G 1.5 in needle, lateral approach Medications: 2 mL lidocaine  1 %; 2 mL bupivacaine  0.25 %; 6 mg betamethasone acetate-betamethasone sodium phosphate  6  (3-3) MG/ML Outcome: tolerated well, no immediate complications  Greater Trochanteric Bursa Injection, Left After discussion on risk/benefits/indications, an informed verbal consent was obtained and a timeout was performed. The patient was lying in lateral recumbent position on exam table. The area overlying the trochanteric bursa was prepped with Betadine and alcohol swab then injected with 2:2:1 lidocaine :bupivicaine:celestone. Patient tolerated procedure well without immediate complications.  Procedure, treatment alternatives, risks and benefits explained, specific risks discussed. Consent was given by the patient. Immediately prior to procedure a time out was called to verify the correct patient, procedure, equipment, support staff and site/side marked as required. Patient was prepped and draped in the usual sterile fashion.          Clinical History: No specialty comments available.  He reports that he quit smoking about 16 years ago. His smoking use included cigarettes. He started smoking about 59 years ago. He has a 8.6 pack-year smoking history. He has been exposed to tobacco smoke. He has never used smokeless tobacco. No results for input(s): HGBA1C, LABURIC in the last 8760 hours.  Objective:    Physical Exam  Gen: Well-appearing, in no acute distress; non-toxic CV: Well-perfused. Warm.  Resp: Breathing unlabored on room air; no wheezing. Psych: Fluid speech in conversation; appropriate affect; normal thought process  Ortho Exam - Bilateral hips: + TTP over bilateral greater trochanteric regions, left greater than right.  No appreciable bursitis palpated.  There is mild restriction in logroll bilaterally but without significant pain.  Positive Faber test, mildly positive FADIR test.  Negative Stinchfield bilaterally.  There is weakness bilaterally with resisted hip abduction, strength 4/5 bilaterally with reproduction of pain.  Imaging: XR HIP UNILAT W OR W/O PELVIS 2-3  VIEWS RIGHT Result Date: 05/22/2024 2 views of the right hip including AP pelvis and right hip lateral femoral ordered and reviewed by myself today.  X-rays demonstrate moderate hip osteoarthritis with subchondral sclerosis over the acetabular rim and the superior lateral aspect of the femoral head.  There is no appreciable areas of full-thickness cartilage loss or joint space narrowing.  *Independent review and interpretation of 2 view left hip x-ray from 04/09/2024 was performed by myself today.  Evidence of moderate osteoarthritic changes of both hips with more notable subchondral sclerosis of the acetabular rim.  There are postoperative changes noted with lumbar fusion of the mid to lower lumbar spine incompletely visualized.  No acute fracture noted of the hips.  No areas of full thickness cartilage loss within the joint.  Narrative & Impression  CLINICAL DATA:  Left leg pain for 1 year after back surgery.   EXAM: DG HIP (WITH OR WITHOUT PELVIS) 2-3V LEFT   COMPARISON:  None Available.   FINDINGS: There is no evidence of hip fracture or dislocation. Moderate severity degenerative changes are seen in the form of joint space narrowing, acetabular sclerosis and lateral acetabular bony spurring. Postoperative changes are present throughout the lumbar spine.   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     Electronically Signed   By: Suzen Dials M.D.   On: 04/09/2024 15:10    Past Medical/Family/Surgical/Social History: Medications & Allergies reviewed per EMR, new medications updated. Patient Active  Problem List   Diagnosis Date Noted   Tobacco use 10/18/2023   Post-viral cough syndrome 07/20/2023   Aortic atherosclerosis (HCC) 05/22/2023   COPD (chronic obstructive pulmonary disease) (HCC) 02/13/2023   Low back pain 12/13/2022   Arthritis of left shoulder region 12/13/2022   Hiccups 10/26/2022   Acute esophagitis 10/26/2022   Esophageal  candidiasis (HCC) 10/26/2022   Drug-induced constipation 10/25/2022   Ileus (HCC) 10/24/2022   Acute blood loss anemia 10/24/2022   Stool guaiac positive 10/24/2022   Abdominal distention 10/24/2022   Fever 10/24/2022   AKI (acute kidney injury) (HCC) 10/24/2022   Elevated liver enzymes 10/24/2022   BPH (benign prostatic hyperplasia) 10/24/2022   Radiculopathy of lumbar region 10/19/2022   Pulmonary nodules 08/23/2022   Chronic right shoulder pain 10/04/2020   Frequent headaches 10/13/2019   Hyperglycemia 10/11/2019   Hepatitis C antibody test positive 06/26/2019   DOE (dyspnea on exertion) 02/13/2019   Overweight (BMI 25.0-29.9) 02/13/2019   Anemia 04/30/2018   ED (erectile dysfunction) 11/03/2016   Lumbar radiculopathy 06/30/2016   Family history of diabetes mellitus 03/14/2016   Encounter for prostate cancer screening 03/14/2016   H/O: CVA (cerebrovascular accident) 12/26/2012   HTN (hypertension) 12/26/2012   HLD (hyperlipidemia) 12/26/2012   Allergic rhinitis 12/26/2012   Past Medical History:  Diagnosis Date   Arthritis    Asthma    BPH (benign prostatic hyperplasia)    Hepatitis    treated for Hep C in the past   History of substance abuse (HCC)    cocaine- over 20 years ago   Hyperlipidemia    Hypertension    Stroke (HCC)    Vertigo    Family History  Problem Relation Age of Onset   Diabetes Father    Colon cancer Neg Hx    Colon polyps Neg Hx    Esophageal cancer Neg Hx    Stomach cancer Neg Hx    Rectal cancer Neg Hx    Past Surgical History:  Procedure Laterality Date   BIOPSY  10/26/2022   Procedure: BIOPSY;  Surgeon: Albertus Gordy HERO, MD;  Location: [facility] ENDOSCOPY;  Service: Gastroenterology;;   COLONOSCOPY     ESOPHAGOGASTRODUODENOSCOPY (EGD) WITH PROPOFOL  N/A 10/26/2022   Procedure: ESOPHAGOGASTRODUODENOSCOPY (EGD) WITH PROPOFOL ;  Surgeon: Albertus Gordy HERO, MD;  Location: [facility] ENDOSCOPY;  Service: Gastroenterology;  Laterality: N/A;   LAMINECTOMY WITH  POSTERIOR LATERAL ARTHRODESIS LEVEL 4 N/A 10/19/2022   Procedure: Lumbar one to Sacral one Laminectomies with Lumbar one to Sacral one Posterolateral instrumented fusion;  Surgeon: Cheryle Debby LABOR, MD;  Location: [facility] OR;  Service: Neurosurgery;  Laterality: N/A;   MULTIPLE TOOTH EXTRACTIONS     Social History   Occupational History   Occupation: Art gallery manager [employer]  Tobacco Use   Smoking status: Former    Current packs/day: 0.00    Average packs/day: 0.2 packs/day for 43.0 years (8.6 ttl pk-yrs)    Types: Cigarettes    Start date: 1966    Quit date: 2009    Years since quitting: 16.6    Passive exposure: Past   Smokeless tobacco: Never  Vaping Use   Vaping status: Never Used  Substance and Sexual Activity   Alcohol use: No    Alcohol/week: 0.0 standard drinks of alcohol   Drug use: Not Currently    Types: Cocaine    Comment: 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

## 2024-05-27 ENCOUNTER — Telehealth: Payer: Self-pay

## 2024-05-27 NOTE — Telephone Encounter (Signed)
 Copied from CRM [id number]. Topic: Appointments - Scheduling Inquiry for Clinic >> May 21, 2024  4:36 PM Jasmin G wrote: Reason for CRM: Pt's wife called to reschedule pt's appt previously on Sep 3rd, I rescheduled appt on next available, Nov 11th and pt's wife was told that pt would need to get blood work done before appt, so we were unsure since appt will be until November if clinic would still want to get appt scheduled this week or wait until the week of the appt. Please call her back at [phone] to let her know, it's okay to leave a message. >> May 23, 2024  3:12 PM Hamdi H wrote: I'm routing this CRM to [facility] because it looks like he's still actively seen at this clinic. Based on PCP, I'm unsure of where else to route this message.   He is no longer our patient, He is a patient at [facility]    Message sent to [place], Hamdi

## 2024-05-28 ENCOUNTER — Ambulatory Visit: Admitting: Gastroenterology

## 2024-06-14 ENCOUNTER — Other Ambulatory Visit: Payer: Self-pay | Admitting: Family

## 2024-06-14 DIAGNOSIS — I10 Essential (primary) hypertension: Secondary | ICD-10-CM

## 2024-06-23 ENCOUNTER — Encounter: Payer: Self-pay | Admitting: Sports Medicine

## 2024-06-23 ENCOUNTER — Ambulatory Visit (INDEPENDENT_AMBULATORY_CARE_PROVIDER_SITE_OTHER): Admitting: Sports Medicine

## 2024-06-23 DIAGNOSIS — R29898 Other symptoms and signs involving the musculoskeletal system: Secondary | ICD-10-CM

## 2024-06-23 DIAGNOSIS — M25552 Pain in left hip: Secondary | ICD-10-CM

## 2024-06-23 DIAGNOSIS — G8929 Other chronic pain: Secondary | ICD-10-CM

## 2024-06-23 DIAGNOSIS — M25551 Pain in right hip: Secondary | ICD-10-CM | POA: Diagnosis not present

## 2024-06-23 DIAGNOSIS — M160 Bilateral primary osteoarthritis of hip: Secondary | ICD-10-CM | POA: Diagnosis not present

## 2024-06-23 MED ORDER — BUPIVACAINE HCL 0.25 % IJ SOLN
2.0000 mL | INTRAMUSCULAR | Status: AC | PRN
  Administered 2024-06-23: 2 mL via INTRA_ARTICULAR

## 2024-06-23 MED ORDER — BETAMETHASONE SOD PHOS & ACET 6 (3-3) MG/ML IJ SUSP
6.0000 mg | INTRAMUSCULAR | Status: AC | PRN
  Administered 2024-06-23: 6 mg via INTRA_ARTICULAR

## 2024-06-23 MED ORDER — LIDOCAINE HCL 1 % IJ SOLN
2.0000 mL | INTRAMUSCULAR | Status: AC | PRN
  Administered 2024-06-23: 2 mL

## 2024-06-23 NOTE — Progress Notes (Signed)
 Todd Mendoza. - 73 y.o. male MRN [id number]  Date of birth: 1950/10/28  Office Visit Note: Visit Date: 06/23/2024 PCP: Arloa Jarvis, NP Referred by: Arloa Jarvis, NP  Subjective: Chief Complaint  Patient presents with   Left Hip - Follow-up   Right Hip - Follow-up   HPI: Todd Mendoza. is a pleasant 73 y.o. male who presents today for follow-up of acute on chronic bilateral hip pain.  Just over 1 month ago we did perform bilateral greater trochanteric injections.  Maahir states he did extremely well and had essentially complete relief of his pain on both hips for the first few weeks.  Just recently over the last week or 2 his left hip pain started becoming painful again, continues over the lateral aspect of the GT.  The right hip is doing well and he has not had any return of his pain.  He has done some of his home exercises but admits he has not been as consistent with these.  He did have some left-sided knee pain and so recently was started on meloxicam  by his PCP.  Pertinent ROS were reviewed with the patient and found to be negative unless otherwise specified above in HPI.   Assessment & Plan: Visit Diagnoses:  1. Chronic hip pain, bilateral   2. Bilateral primary osteoarthritis of hip   3. Weakness of left hip    Plan: Impression is chronic bilateral GTPS of both hips, the right hip has resolved status post GT injection, the left hip resolved fully for the first few weeks but then pain is returning.  He does have continued left hip abduction strength weakness on the left compared to full strength on the right hip.  He did do a few sessions of his home physical therapy but has not been consistent with these.  We discussed needing to get his strength symmetric and equal on both hips and then his pain reduction would follow this.  He does not have time for formalized physical therapy but is agreeable to really being consistent with his home exercises on a daily basis.  Discussed  doing these 6 days weekly.  He does have hip osteoarthritis as well but no specific pain emanating from within the hip joint.  He was started on meloxicam  daily by his PCP for his left knee pain, fine to continue this in the short-term although would not recommend consistent use in the long run given his medical comorbidities, follow-up specifically for this with his PCP.  I would like to hear from him or see him back in about 6 weeks to recheck the hips and see how they are doing.  If for some reason he is having continued pain, next step may include MRI of the hip to evaluate the gluteal musculature/tendon quality.  Follow-up: Return for Bilateral hips (L > R).   Meds & Orders: No orders of the defined types were placed in this encounter.  No orders of the defined types were placed in this encounter.    Procedures: No procedures performed      Clinical History: No specialty comments available.  He reports that he quit smoking about 16 years ago. His smoking use included cigarettes. He started smoking about 59 years ago. He has a 8.6 pack-year smoking history. He has been exposed to tobacco smoke. He has never used smokeless tobacco. No results for input(s): HGBA1C, LABURIC in the last 8760 hours.  Objective:     Physical Exam  Gen: Well-appearing, in  no acute distress; non-toxic CV: Well-perfused. Warm.  Resp: Breathing unlabored on room air; no wheezing. Psych: Fluid speech in conversation; appropriate affect; normal thought process  Ortho Exam - Bilateral hips: Very mild internal restriction with logroll bilaterally although no reproduction of pain.  Negative FADIR test.  Positive TTP over the posterior aspect of the greater trochanteric region on the left, minimal on the right.  There is 4/5 strength with resisted hip abduction on the left hip, 5/5 strength which is full on the contralateral right hip.  Imaging: No results found.  Past Medical/Family/Surgical/Social  History: Medications & Allergies reviewed per EMR, new medications updated. Patient Active Problem List   Diagnosis Date Noted   Tobacco use 10/18/2023   Post-viral cough syndrome 07/20/2023   Aortic atherosclerosis 05/22/2023   COPD (chronic obstructive pulmonary disease) (HCC) 02/13/2023   Low back pain 12/13/2022   Arthritis of left shoulder region 12/13/2022   Hiccups 10/26/2022   Acute esophagitis 10/26/2022   Esophageal candidiasis (HCC) 10/26/2022   Drug-induced constipation 10/25/2022   Ileus (HCC) 10/24/2022   Acute blood loss anemia 10/24/2022   Stool guaiac positive 10/24/2022   Abdominal distention 10/24/2022   Fever 10/24/2022   AKI (acute kidney injury) 10/24/2022   Elevated liver enzymes 10/24/2022   BPH (benign prostatic hyperplasia) 10/24/2022   Radiculopathy of lumbar region 10/19/2022   Pulmonary nodules 08/23/2022   Chronic right shoulder pain 10/04/2020   Frequent headaches 10/13/2019   Hyperglycemia 10/11/2019   Hepatitis C antibody test positive 06/26/2019   DOE (dyspnea on exertion) 02/13/2019   Overweight (BMI 25.0-29.9) 02/13/2019   Anemia 04/30/2018   ED (erectile dysfunction) 11/03/2016   Lumbar radiculopathy 06/30/2016   Family history of diabetes mellitus 03/14/2016   Encounter for prostate cancer screening 03/14/2016   H/O: CVA (cerebrovascular accident) 12/26/2012   HTN (hypertension) 12/26/2012   HLD (hyperlipidemia) 12/26/2012   Allergic rhinitis 12/26/2012   Past Medical History:  Diagnosis Date   Arthritis    Asthma    BPH (benign prostatic hyperplasia)    Hepatitis    treated for Hep C in the past   History of substance abuse (HCC)    cocaine- over 20 years ago   Hyperlipidemia    Hypertension    Stroke (HCC)    Vertigo    Family History  Problem Relation Age of Onset   Diabetes Father    Colon cancer Neg Hx    Colon polyps Neg Hx    Esophageal cancer Neg Hx    Stomach cancer Neg Hx    Rectal cancer Neg Hx    Past  Surgical History:  Procedure Laterality Date   BIOPSY  10/26/2022   Procedure: BIOPSY;  Surgeon: Albertus Gordy HERO, MD;  Location[facility] ENDOSCOPY;  Service: Gastroenterology;;   COLONOSCOPY     ESOPHAGOGASTRODUODENOSCOPY (EGD) WITH PROPOFOL  N/A 10/26/2022   Procedure: ESOPHAGOGASTRODUODENOSCOPY (EGD) WITH PROPOFOL ;  Surgeon: Albertus Gordy HERO, MD;  Location: [facility] ENDOSCOPY;  Service: Gastroenterology;  Laterality: N/A;   LAMINECTOMY WITH POSTERIOR LATERAL ARTHRODESIS LEVEL 4 N/A 10/19/2022   Procedure: Lumbar one to Sacral one Laminectomies with Lumbar one to Sacral one Posterolateral instrumented fusion;  Surgeon: Cheryle Debby LABOR, MD;  Location: [facility] OR;  Service: Neurosurgery;  Laterality: N/A;   MULTIPLE TOOTH EXTRACTIONS     Social History   Occupational History   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[employer]  Tobacco Use   Smoking status: Former    Current packs/day: 0.00    Average packs/day: 0.2 packs/day  for 43.0 years (8.6 ttl pk-yrs)    Types: Cigarettes    Start date: 28    Quit date: 2009    Years since quitting: 16.7    Passive exposure: Past   Smokeless tobacco: Never  Vaping Use   Vaping status: Never Used  Substance and Sexual Activity   Alcohol use: No    Alcohol/week: 0.0 standard drinks of alcohol   Drug use: Not Currently    Types: Cocaine    Comment: 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

## 2024-06-23 NOTE — Progress Notes (Signed)
 Patient says that both of his hips did very well for awhile, although the left hip pain has returned. He says that his right hip is still feeling great, but the left feels the same now as it did at his first visit. He has been doing his home exercises a couple of times per week, and those are going well.

## 2024-07-08 ENCOUNTER — Other Ambulatory Visit: Payer: Self-pay | Admitting: Physician Assistant

## 2024-07-08 DIAGNOSIS — D509 Iron deficiency anemia, unspecified: Secondary | ICD-10-CM

## 2024-07-28 ENCOUNTER — Encounter: Payer: Self-pay | Admitting: Radiology

## 2024-08-05 ENCOUNTER — Ambulatory Visit: Admitting: Gastroenterology

## 2024-08-09 ENCOUNTER — Other Ambulatory Visit: Payer: Self-pay | Admitting: Family

## 2024-08-09 DIAGNOSIS — I10 Essential (primary) hypertension: Secondary | ICD-10-CM

## 2024-10-02 ENCOUNTER — Other Ambulatory Visit: Payer: Self-pay | Admitting: Physician Assistant

## 2024-10-02 DIAGNOSIS — D509 Iron deficiency anemia, unspecified: Secondary | ICD-10-CM

## 2024-10-03 ENCOUNTER — Encounter: Payer: Self-pay | Admitting: Gastroenterology

## 2024-10-03 ENCOUNTER — Other Ambulatory Visit

## 2024-10-03 ENCOUNTER — Ambulatory Visit: Admitting: Gastroenterology

## 2024-10-03 VITALS — BP 150/80 | HR 97 | Ht 71.0 in | Wt 208.0 lb

## 2024-10-03 DIAGNOSIS — Z8719 Personal history of other diseases of the digestive system: Secondary | ICD-10-CM

## 2024-10-03 DIAGNOSIS — Z860100 Personal history of colon polyps, unspecified: Secondary | ICD-10-CM

## 2024-10-03 DIAGNOSIS — D509 Iron deficiency anemia, unspecified: Secondary | ICD-10-CM

## 2024-10-03 DIAGNOSIS — Z860101 Personal history of adenomatous and serrated colon polyps: Secondary | ICD-10-CM

## 2024-10-03 DIAGNOSIS — R160 Hepatomegaly, not elsewhere classified: Secondary | ICD-10-CM | POA: Diagnosis not present

## 2024-10-03 LAB — IBC + FERRITIN
Ferritin: 23 ng/mL (ref 22.0–322.0)
Iron: 81 ug/dL (ref 42–165)
Saturation Ratios: 25 % (ref 20.0–50.0)
TIBC: 323.4 ug/dL (ref 250.0–450.0)
Transferrin: 231 mg/dL (ref 212.0–360.0)

## 2024-10-03 LAB — COMPREHENSIVE METABOLIC PANEL WITH GFR
ALT: 19 U/L (ref 3–53)
AST: 30 U/L (ref 5–37)
Albumin: 4.4 g/dL (ref 3.5–5.2)
Alkaline Phosphatase: 82 U/L (ref 39–117)
BUN: 16 mg/dL (ref 6–23)
CO2: 31 meq/L (ref 19–32)
Calcium: 10 mg/dL (ref 8.4–10.5)
Chloride: 100 meq/L (ref 96–112)
Creatinine, Ser: 0.92 mg/dL (ref 0.40–1.50)
GFR: 81.94 mL/min
Glucose, Bld: 105 mg/dL — ABNORMAL HIGH (ref 70–99)
Potassium: 2.9 meq/L — ABNORMAL LOW (ref 3.5–5.1)
Sodium: 138 meq/L (ref 135–145)
Total Bilirubin: 0.6 mg/dL (ref 0.2–1.2)
Total Protein: 7.7 g/dL (ref 6.0–8.3)

## 2024-10-03 LAB — CBC WITH DIFFERENTIAL/PLATELET
Basophils Absolute: 0 K/uL (ref 0.0–0.1)
Basophils Relative: 0.5 % (ref 0.0–3.0)
Eosinophils Absolute: 0.2 K/uL (ref 0.0–0.7)
Eosinophils Relative: 2.3 % (ref 0.0–5.0)
HCT: 40 % (ref 39.0–52.0)
Hemoglobin: 13.1 g/dL (ref 13.0–17.0)
Lymphocytes Relative: 32.8 % (ref 12.0–46.0)
Lymphs Abs: 2.5 K/uL (ref 0.7–4.0)
MCHC: 32.7 g/dL (ref 30.0–36.0)
MCV: 88.3 fl (ref 78.0–100.0)
Monocytes Absolute: 0.5 K/uL (ref 0.1–1.0)
Monocytes Relative: 6.3 % (ref 3.0–12.0)
Neutro Abs: 4.4 K/uL (ref 1.4–7.7)
Neutrophils Relative %: 58.1 % (ref 43.0–77.0)
Platelets: 211 K/uL (ref 150.0–400.0)
RBC: 4.53 Mil/uL (ref 4.22–5.81)
RDW: 13.2 % (ref 11.5–15.5)
WBC: 7.5 K/uL (ref 4.0–10.5)

## 2024-10-03 LAB — PROTIME-INR
INR: 1.3 ratio — ABNORMAL HIGH (ref 0.8–1.0)
Prothrombin Time: 13.3 s — ABNORMAL HIGH (ref 9.6–13.1)

## 2024-10-03 LAB — B12 AND FOLATE PANEL
Folate: 8.2 ng/mL
Vitamin B-12: 375 pg/mL (ref 211–911)

## 2024-10-03 NOTE — Patient Instructions (Addendum)
 Your provider has requested that you go to the basement level for lab work before leaving today. Press B on the elevator. The lab is located at the first door on the left as you exit the elevator.   You have been scheduled for an MRI at [facility] on 10/09/24. Your appointment time is 1:00 pm. Please arrive to admitting (at main entrance of the hospital) 30 minutes prior to your appointment time for registration purposes. Please make certain not to have anything to eat or drink 6 hours prior to your test. In addition, if you have any metal in your body, have a pacemaker or defibrillator, please be sure to let your ordering physician know. This test typically takes 45 minutes to 1 hour to complete. Should you need to reschedule, please call [phone] to do so.  Due to recent changes in healthcare laws, you may see the results of your imaging and laboratory studies on MyChart before your provider has had a chance to review them.  We understand that in some cases there may be results that are confusing or concerning to you. Not all laboratory results come back in the same time frame and the provider may be waiting for multiple results in order to interpret others.  Please give us  48 hours in order for your provider to thoroughly review all the results before contacting the office for clarification of your results.   _______________________________________________________  If your blood pressure at your visit was 140/90 or greater, please contact your primary care physician to follow up on this.  _______________________________________________________  If you are age 71 or older, your body mass index should be between 23-30. Your Body mass index is 29.01 kg/m. If this is out of the aforementioned range listed, please consider follow up with your Primary Care Provider.  If you are age 58 or younger, your body mass index should be between 19-25. Your Body mass index is 29.01 kg/m. If this is out of  the aformentioned range listed, please consider follow up with your Primary Care Provider.   ________________________________________________________  The [facility] providers would like to encourage you to use MYCHART to communicate with providers for non-urgent requests or questions.  Due to long hold times on the telephone, sending your provider a message by [facility] may be a faster and more efficient way to get a response.  Please allow 48 business hours for a response.  Please remember that this is for non-urgent requests.  _______________________________________________________  [facility] is using a team-based approach to care.  Your team is made up of your doctor and two to three APPS. Our APPS (Nurse Practitioners and Physician Assistants) work with your physician to ensure care continuity for you. They are fully qualified to address your health concerns and develop a treatment plan. They communicate directly with your gastroenterologist to care for you. Seeing the Advanced Practice Practitioners on your physician's team can help you by facilitating care more promptly, often allowing for earlier appointments, access to diagnostic testing, procedures, and other specialty referrals.   Thank you for choosing me and [facility].  Dr. Wilhelmenia

## 2024-10-03 NOTE — Progress Notes (Unsigned)
 "  GASTROENTEROLOGY PROCEDURE H&P NOTE   Primary Care Physician: Arloa Jarvis, NP  HPI: Todd Mendoza. is a 74 y.o. male who presents for ***  Past Medical History:  Diagnosis Date   Arthritis    Asthma    BPH (benign prostatic hyperplasia)    Hepatitis    treated for Hep C in the past   History of substance abuse (HCC)    cocaine- over 20 years ago   Hyperlipidemia    Hypertension    Stroke [facility])    Vertigo    Past Surgical History:  Procedure Laterality Date   BIOPSY  10/26/2022   Procedure: BIOPSY;  Surgeon: Albertus Gordy HERO, MD;  Location: [facility] ENDOSCOPY;  Service: Gastroenterology;;   COLONOSCOPY     ESOPHAGOGASTRODUODENOSCOPY (EGD) WITH PROPOFOL  N/A 10/26/2022   Procedure: ESOPHAGOGASTRODUODENOSCOPY (EGD) WITH PROPOFOL ;  Surgeon: Albertus Gordy HERO, MD;  Location: [facility] ENDOSCOPY;  Service: Gastroenterology;  Laterality: N/A;   LAMINECTOMY WITH POSTERIOR LATERAL ARTHRODESIS LEVEL 4 N/A 10/19/2022   Procedure: Lumbar one to Sacral one Laminectomies with Lumbar one to Sacral one Posterolateral instrumented fusion;  Surgeon: Cheryle Debby LABOR, MD;  Location: [facility] OR;  Service: Neurosurgery;  Laterality: N/A;   MULTIPLE TOOTH EXTRACTIONS     Current Outpatient Medications  Medication Sig Dispense Refill   albuterol  (VENTOLIN  HFA) 108 (90 Base) MCG/ACT inhaler TAKE 2 PUFFS BY MOUTH EVERY 6 HOURS AS NEEDED FOR WHEEZE OR SHORTNESS OF BREATH 8.5 each 2   amLODipine  (NORVASC ) 10 MG tablet TAKE 1 TABLET BY MOUTH EVERY DAY 90 tablet 1   aspirin  EC 81 MG tablet Take 1 tablet (81 mg total) by mouth daily. 30 tablet 0   atorvastatin  (LIPITOR) 80 MG tablet Take 1 tablet (80 mg total) by mouth daily. 90 tablet 3   COMIRNATY syringe      fenofibrate  (TRICOR ) 145 MG tablet TAKE 1 TABLET BY MOUTH EVERY DAY 90 tablet 1   ferrous sulfate  325 (65 FE) MG tablet TAKE 1 TABLET BY MOUTH EVERY DAY WITH BREAKFAST 30 tablet 0   finasteride  (PROSCAR ) 5 MG tablet TAKE 1 TABLET (5 MG TOTAL) BY MOUTH DAILY.  90 tablet 1   fluticasone  (FLONASE ) 50 MCG/ACT nasal spray SPRAY 2 SPRAYS INTO EACH NOSTRIL EVERY DAY 48 mL 4   Fluticasone -Umeclidin-Vilant (TRELEGY ELLIPTA ) 100-62.5-25 MCG/ACT AEPB Inhale 1 puff into the lungs daily. 60 each 5   gabapentin  (NEURONTIN ) 300 MG capsule Take 1 capsule (300 mg total) by mouth 3 (three) times daily. 90 capsule 1   hydrochlorothiazide  (HYDRODIURIL ) 25 MG tablet Take 1 tablet (25 mg total) by mouth daily. 90 tablet 3   levocetirizine (XYZAL ) 5 MG tablet Take 1 tablet (5 mg total) by mouth daily as needed for allergies. 30 tablet 5   Multiple Vitamins-Minerals (MULTIVITAMIN WITH MINERALS) tablet Take 1 tablet by mouth daily. 30 tablet 5   pantoprazole  (PROTONIX ) 40 MG tablet Take 1 tablet (40 mg total) by mouth daily. 90 tablet 3   silodosin (RAPAFLO) 8 MG CAPS capsule Take 8 mg by mouth daily.     No current facility-administered medications for this visit.   Current Medications[1] Allergies[2] Family History  Problem Relation Age of Onset   Diabetes Father    Colon cancer Neg Hx    Colon polyps Neg Hx    Esophageal cancer Neg Hx    Stomach cancer Neg Hx    Rectal cancer Neg Hx    Social History   Socioeconomic History   Marital status:  Married    Spouse name: Not on file   Number of children: Not on file   Years of education: Not on file   Highest education level: Not on file  Occupational History   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[employer]  Tobacco Use   Smoking status: Former    Current packs/day: 0.00    Average packs/day: 0.2 packs/day for 43.0 years (8.6 ttl pk-yrs)    Types: Cigarettes    Start date: 67    Quit date: 2009    Years since quitting: 17.0    Passive exposure: Past   Smokeless tobacco: Never  Vaping Use   Vaping status: Never Used  Substance and Sexual Activity   Alcohol use: No    Alcohol/week: 0.0 standard drinks of alcohol   Drug use: Not Currently    Types: Cocaine    Comment: hx IV drug use prior to cocaine use   Sexual  activity: Yes    Birth control/protection: None  Other Topics Concern   Not on file  Social History Narrative   Married   Walks daily   Social Drivers of Health   Tobacco Use: Medium Risk (06/23/2024)   Patient History    Smoking Tobacco Use: Former    Smokeless Tobacco Use: Never    Passive Exposure: Past  Physicist, Medical Strain: Not on file  Food Insecurity: No Food Insecurity (10/30/2022)   Hunger Vital Sign    Worried About Running Out of Food in the Last Year: Never true    Ran Out of Food in the Last Year: Never true  Transportation Needs: No Transportation Needs (12/14/2022)   PRAPARE - Administrator, Civil Service (Medical): No    Lack of Transportation (Non-Medical): No  Physical Activity: Not on file  Stress: Not on file  Social Connections: Not on file  Intimate Partner Violence: Not At Risk (10/24/2022)   Humiliation, Afraid, Rape, and Kick questionnaire    Fear of Current or Ex-Partner: No    Emotionally Abused: No    Physically Abused: No    Sexually Abused: No  Depression (PHQ2-9): Low Risk (11/07/2023)   Depression (PHQ2-9)    PHQ-2 Score: 0  Alcohol Screen: Not on file  Housing: Low Risk (10/24/2022)   Housing    Last Housing Risk Score: 0  Utilities: Not At Risk (10/24/2022)   AHC Utilities    Threatened with loss of utilities: No  Health Literacy: Not on file    Physical Exam: Today's Vitals   10/03/24 1530  BP: (!) 150/80  Pulse: 97  Weight: 208 lb (94.3 kg)  Height: 5' 11 (1.803 m)   Body mass index is 29.01 kg/m. GEN: NAD EYE: Sclerae anicteric ENT: MMM CV: Non-tachycardic GI: Soft, NT/ND NEURO:  Alert & Oriented x 3  Lab Results: No results for input(s): WBC, HGB, HCT, PLT in the last 72 hours. BMET No results for input(s): NA, K, CL, CO2, GLUCOSE, BUN, CREATININE, CALCIUM  in the last 72 hours. LFT No results for input(s): PROT, ALBUMIN, AST, ALT, ALKPHOS, BILITOT, BILIDIR, IBILI  in the last 72 hours. PT/INR No results for input(s): LABPROT, INR in the last 72 hours.   Impression / Plan: This is a 74 y.o.male who presents for   The risks and benefits of endoscopic evaluation/treatment were discussed with the patient and/or family; these include but are not limited to the risk of perforation, infection, bleeding, missed lesions, lack of diagnosis, severe illness requiring hospitalization, as well as anesthesia  and sedation related illnesses.  The patient's history has been reviewed, patient examined, no change in status, and deemed stable for procedure.  The patient and/or family was provided an opportunity to ask questions and all were answered.  The patient and/or family is agreeable to proceed.    Aloha Finner, MD [facility] Office # [phone]     [1]  Current Outpatient Medications:    albuterol  (VENTOLIN  HFA) 108 (90 Base) MCG/ACT inhaler, TAKE 2 PUFFS BY MOUTH EVERY 6 HOURS AS NEEDED FOR WHEEZE OR SHORTNESS OF BREATH, Disp: 8.5 each, Rfl: 2   amLODipine  (NORVASC ) 10 MG tablet, TAKE 1 TABLET BY MOUTH EVERY DAY, Disp: 90 tablet, Rfl: 1   aspirin  EC 81 MG tablet, Take 1 tablet (81 mg total) by mouth daily., Disp: 30 tablet, Rfl: 0   atorvastatin  (LIPITOR) 80 MG tablet, Take 1 tablet (80 mg total) by mouth daily., Disp: 90 tablet, Rfl: 3   COMIRNATY syringe, , Disp: , Rfl:    fenofibrate  (TRICOR ) 145 MG tablet, TAKE 1 TABLET BY MOUTH EVERY DAY, Disp: 90 tablet, Rfl: 1   ferrous sulfate  325 (65 FE) MG tablet, TAKE 1 TABLET BY MOUTH EVERY DAY WITH BREAKFAST, Disp: 30 tablet, Rfl: 0   finasteride  (PROSCAR ) 5 MG tablet, TAKE 1 TABLET (5 MG TOTAL) BY MOUTH DAILY., Disp: 90 tablet, Rfl: 1   fluticasone  (FLONASE ) 50 MCG/ACT nasal spray, SPRAY 2 SPRAYS INTO EACH NOSTRIL EVERY DAY, Disp: 48 mL, Rfl: 4   Fluticasone -Umeclidin-Vilant (TRELEGY ELLIPTA ) 100-62.5-25 MCG/ACT AEPB, Inhale 1 puff into the lungs daily., Disp: 60 each,  Rfl: 5   gabapentin  (NEURONTIN ) 300 MG capsule, Take 1 capsule (300 mg total) by mouth 3 (three) times daily., Disp: 90 capsule, Rfl: 1   hydrochlorothiazide  (HYDRODIURIL ) 25 MG tablet, Take 1 tablet (25 mg total) by mouth daily., Disp: 90 tablet, Rfl: 3   levocetirizine (XYZAL ) 5 MG tablet, Take 1 tablet (5 mg total) by mouth daily as needed for allergies., Disp: 30 tablet, Rfl: 5   Multiple Vitamins-Minerals (MULTIVITAMIN WITH MINERALS) tablet, Take 1 tablet by mouth daily., Disp: 30 tablet, Rfl: 5   pantoprazole  (PROTONIX ) 40 MG tablet, Take 1 tablet (40 mg total) by mouth daily., Disp: 90 tablet, Rfl: 3   silodosin (RAPAFLO) 8 MG CAPS capsule, Take 8 mg by mouth daily., Disp: , Rfl:  [2] No Known Allergies  "

## 2024-10-04 ENCOUNTER — Ambulatory Visit: Payer: Self-pay | Admitting: Gastroenterology

## 2024-10-04 ENCOUNTER — Encounter: Payer: Self-pay | Admitting: Gastroenterology

## 2024-10-04 DIAGNOSIS — Z8719 Personal history of other diseases of the digestive system: Secondary | ICD-10-CM | POA: Insufficient documentation

## 2024-10-04 DIAGNOSIS — Z860100 Personal history of colon polyps, unspecified: Secondary | ICD-10-CM | POA: Insufficient documentation

## 2024-10-04 DIAGNOSIS — R160 Hepatomegaly, not elsewhere classified: Secondary | ICD-10-CM | POA: Insufficient documentation

## 2024-10-06 ENCOUNTER — Other Ambulatory Visit: Payer: Self-pay

## 2024-10-06 LAB — HAPTOGLOBIN: Haptoglobin: 183 mg/dL (ref 43–212)

## 2024-10-06 LAB — TISSUE TRANSGLUTAMINASE, IGA: (tTG) Ab, IgA: <1 U/mL

## 2024-10-06 LAB — IRON,TIBC AND FERRITIN PANEL
%SAT: 26 % (ref 20–48)
Ferritin: 28 ng/mL (ref 24–380)
Iron: 78 ug/dL (ref 50–180)
TIBC: 295 ug/dL (ref 250–425)

## 2024-10-06 LAB — IGA: Immunoglobulin A: 196 mg/dL (ref 70–320)

## 2024-10-06 MED ORDER — POTASSIUM CHLORIDE CRYS ER 20 MEQ PO TBCR: 20.0000 meq | EXTENDED_RELEASE_TABLET | Freq: Every day | ORAL | 0 refills | Status: AC

## 2024-10-09 ENCOUNTER — Ambulatory Visit: Admitting: Emergency Medicine

## 2024-10-09 ENCOUNTER — Ambulatory Visit (HOSPITAL_COMMUNITY)
Admission: RE | Admit: 2024-10-09 | Discharge: 2024-10-09 | Disposition: A | Discharge status: Home / Self Care | Source: Ambulatory Visit | Attending: Gastroenterology | Admitting: Gastroenterology

## 2024-10-09 DIAGNOSIS — Z860101 Personal history of adenomatous and serrated colon polyps: Secondary | ICD-10-CM | POA: Insufficient documentation

## 2024-10-09 DIAGNOSIS — R160 Hepatomegaly, not elsewhere classified: Secondary | ICD-10-CM | POA: Diagnosis present

## 2024-10-09 MED ORDER — GADOBUTROL 1 MMOL/ML IV SOLN
10.0000 mL | Freq: Once | INTRAVENOUS | Status: AC | PRN
  Administered 2024-10-09: 10 mL via INTRAVENOUS

## 2024-10-21 ENCOUNTER — Other Ambulatory Visit: Payer: Self-pay

## 2024-10-21 DIAGNOSIS — D509 Iron deficiency anemia, unspecified: Secondary | ICD-10-CM

## 2024-10-21 DIAGNOSIS — R160 Hepatomegaly, not elsewhere classified: Secondary | ICD-10-CM

## 2024-10-21 NOTE — Progress Notes (Signed)
 Labs have been entered

## 2024-10-22 ENCOUNTER — Other Ambulatory Visit (INDEPENDENT_AMBULATORY_CARE_PROVIDER_SITE_OTHER)

## 2024-10-22 DIAGNOSIS — R160 Hepatomegaly, not elsewhere classified: Secondary | ICD-10-CM

## 2024-10-22 DIAGNOSIS — D509 Iron deficiency anemia, unspecified: Secondary | ICD-10-CM

## 2024-10-24 LAB — CANCER ANTIGEN 19-9: CA 19-9: 14 U/mL

## 2024-10-24 LAB — AFP TUMOR MARKER: AFP-Tumor Marker: 1.6 ng/mL

## 2024-10-24 LAB — CEA: CEA: <2 ng/mL

## 2024-10-25 ENCOUNTER — Ambulatory Visit: Payer: Self-pay | Admitting: Gastroenterology

## 2024-10-26 ENCOUNTER — Other Ambulatory Visit: Payer: Self-pay | Admitting: Physician Assistant

## 2024-10-26 DIAGNOSIS — D509 Iron deficiency anemia, unspecified: Secondary | ICD-10-CM

## 2024-10-28 ENCOUNTER — Other Ambulatory Visit: Payer: Self-pay | Admitting: Nurse Practitioner

## 2024-10-28 DIAGNOSIS — J4530 Mild persistent asthma, uncomplicated: Secondary | ICD-10-CM

## 2024-10-28 DIAGNOSIS — J432 Centrilobular emphysema: Secondary | ICD-10-CM

## 2024-10-29 ENCOUNTER — Other Ambulatory Visit: Payer: Self-pay

## 2024-10-29 ENCOUNTER — Encounter: Payer: Self-pay | Admitting: Gastroenterology

## 2024-10-29 NOTE — Progress Notes (Signed)
 Case discussed and presented at multidisciplinary conference this morning. We reviewed his pathology from 2025 as w ell as the rereview pathology from [facility].  We reviewed his imaging showing a new lesion near the previously biopsied (smaller) hepatic lesion. At this point the consensus was that we should try to do a double liver biopsy. 1 biopsy to be performed specifically of normal-appearing tissue of the liver (I need to rule out autoimmune hepatitis with his positive anti-smooth muscle antibody). And another biopsy of the hepatic lesion to rule out necrosis again or if there is any other abnormal or malignant appearing cells. Thankfully all of his typical serological liver malignancy markers are normal. I called and spoke with the patient today and he agrees with this plan of action. I will forward this to my team to work on placing referral for double liver biopsy.   Aloha Finner, MD [facility] Office # [phone]

## 2024-10-29 NOTE — Progress Notes (Signed)
 The proposed treatment discussed in conference is for discussion purpose only and is not a binding recommendation.  The patients have not been physically examined, or presented with their treatment options.  Therefore, final treatment plans cannot be decided.

## 2024-10-29 NOTE — Telephone Encounter (Signed)
 Courtesy albuterol  refill. Pt will need appt for further refills.

## 2024-10-30 ENCOUNTER — Telehealth: Payer: Self-pay

## 2024-10-30 DIAGNOSIS — K769 Liver disease, unspecified: Secondary | ICD-10-CM

## 2024-10-30 DIAGNOSIS — R160 Hepatomegaly, not elsewhere classified: Secondary | ICD-10-CM

## 2024-10-30 NOTE — Telephone Encounter (Signed)
 Urgent referral placed to IR- for Double Liver Biopsies. Patient informed that someone from [facility] will contact him to schedule Liver Biopsy.    Derius Ghosh, Please place a referral to interventional radiology for liver biopsy. Specifically we need to write that he needs 2 separate liver biopsies. One of the biopsies to target the new hepatic lesion on MRI. One of the other biopsies to target normal tissue parenchyma to rule out hepatocellular disease/autoimmune hepatitis. Thanks. GM Mansouraty, Aloha Raddle., MD    10/29/24  8:51 AM Note    Case discussed and presented at multidisciplinary conference this morning. We reviewed his pathology from 2025 as w ell as the rereview pathology from [facility].  We reviewed his imaging showing a new lesion near the previously biopsied (smaller) hepatic lesion. At this point the consensus was that we should try to do a double liver biopsy. 1 biopsy to be performed specifically of normal-appearing tissue of the liver (I need to rule out autoimmune hepatitis with his positive anti-smooth muscle antibody). And another biopsy of the hepatic lesion to rule out necrosis again or if there is any other abnormal or malignant appearing cells. Thankfully all of his typical serological liver malignancy markers are normal. I called and spoke with the patient today and he agrees with this plan of action. I will forward this to my team to work on placing referral for double liver biopsy.     Aloha Finner, MD [facility] Office # [phone]

## 2024-11-11 ENCOUNTER — Ambulatory Visit: Admitting: Pulmonary Disease
